# Patient Record
Sex: Male | Born: 1950 | Race: White | Hispanic: No | Smoking: Former smoker
Health system: Southern US, Community
[De-identification: ages and names within clinical notes are randomized; demographics above are authoritative.]

## PROBLEM LIST (undated history)

## (undated) DIAGNOSIS — I509 Heart failure, unspecified: Secondary | ICD-10-CM

## (undated) DIAGNOSIS — F09 Unspecified mental disorder due to known physiological condition: Secondary | ICD-10-CM

## (undated) DIAGNOSIS — R7303 Prediabetes: Secondary | ICD-10-CM

## (undated) DIAGNOSIS — R0902 Hypoxemia: Secondary | ICD-10-CM

## (undated) DIAGNOSIS — M1990 Unspecified osteoarthritis, unspecified site: Secondary | ICD-10-CM

## (undated) DIAGNOSIS — G4730 Sleep apnea, unspecified: Secondary | ICD-10-CM

## (undated) DIAGNOSIS — J449 Chronic obstructive pulmonary disease, unspecified: Secondary | ICD-10-CM

## (undated) DIAGNOSIS — R0600 Dyspnea, unspecified: Secondary | ICD-10-CM

## (undated) DIAGNOSIS — N19 Unspecified kidney failure: Secondary | ICD-10-CM

## (undated) DIAGNOSIS — Z5189 Encounter for other specified aftercare: Secondary | ICD-10-CM

## (undated) DIAGNOSIS — I219 Acute myocardial infarction, unspecified: Secondary | ICD-10-CM

## (undated) DIAGNOSIS — H269 Unspecified cataract: Secondary | ICD-10-CM

## (undated) DIAGNOSIS — E785 Hyperlipidemia, unspecified: Secondary | ICD-10-CM

## (undated) DIAGNOSIS — K219 Gastro-esophageal reflux disease without esophagitis: Secondary | ICD-10-CM

## (undated) DIAGNOSIS — I10 Essential (primary) hypertension: Secondary | ICD-10-CM

## (undated) HISTORY — DX: Acute myocardial infarction, unspecified: I21.9

## (undated) HISTORY — PX: OTHER SURGICAL HISTORY: SHX169

## (undated) HISTORY — DX: Gastro-esophageal reflux disease without esophagitis: K21.9

## (undated) HISTORY — DX: Encounter for other specified aftercare: Z51.89

## (undated) HISTORY — DX: Hypoxemia: R09.02

## (undated) SURGERY — Surgical Case
Anesthesia: *Unknown

---

## 2021-02-22 ENCOUNTER — Ambulatory Visit (INDEPENDENT_AMBULATORY_CARE_PROVIDER_SITE_OTHER): Payer: Medicare Other | Admitting: Orthopedic Surgery

## 2021-02-22 ENCOUNTER — Ambulatory Visit: Payer: Self-pay

## 2021-02-22 ENCOUNTER — Other Ambulatory Visit: Payer: Self-pay

## 2021-02-22 DIAGNOSIS — G8929 Other chronic pain: Secondary | ICD-10-CM | POA: Diagnosis not present

## 2021-02-22 DIAGNOSIS — M25561 Pain in right knee: Secondary | ICD-10-CM

## 2021-02-22 DIAGNOSIS — M549 Dorsalgia, unspecified: Secondary | ICD-10-CM | POA: Diagnosis not present

## 2021-02-22 DIAGNOSIS — M25562 Pain in left knee: Secondary | ICD-10-CM

## 2021-02-26 ENCOUNTER — Encounter: Payer: Self-pay | Admitting: Orthopedic Surgery

## 2021-02-26 NOTE — Progress Notes (Signed)
Office Visit Note   Patient: Randall Montoya           Date of Birth: 1950-06-29           MRN: [id number] Visit Date: 02/22/2021 Requested by: Mardi Mainland, [place],  [place] [address] PCP: Mardi Mainland, FNP  Subjective: Chief Complaint  Patient presents with   Left Knee - Pain   Right Knee - Pain    HPI: Shriyan is a 70 year old patient with bilateral knee pain and bilateral leg pain.  Reports weakness and giving way.  Also reports numbness and tingling in both feet left worse than right.  He ambulates with braces and a walker.  Does wake at night with cramps.  Reports only mild low back pain.  No prior knee or back surgery.  He has been in prison for 30 years.  States that he is "falling a lot".  Describes left foot numbness in the L4 distribution.              ROS: All systems reviewed are negative as they relate to the chief complaint within the history of present illness.  Patient denies  fevers or chills.   Assessment & Plan: Visit Diagnoses:  1. Chronic pain of both knees   2. Back pain, unspecified back location, unspecified back pain laterality, unspecified chronicity     Plan: Impression is back and leg pain with hip flexor weakness.  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  His lumbar spine radiographs do show some significant lower level facet arthritis.  Plan is with hip flexor weakness and falling that we get MRI of the lumbar spine to evaluate for possible stenosis.  Follow-up after that study.  Follow-Up Instructions: No follow-ups on file.   Orders:  Orders Placed This Encounter  Procedures   XR Knee 1-2 Views Right   XR Knee 1-2 Views Left   XR Lumbar Spine 2-3 Views   MR Lumbar Spine w/o contrast   No orders of the defined types were placed in this encounter.     Procedures: No procedures performed   Clinical Data: No additional findings.  Objective: Vital Signs: There were no vitals taken  for this visit.  Physical Exam:   Constitutional: Patient appears well-developed HEENT:  Head: Normocephalic Eyes:EOM are normal Neck: Normal range of motion Cardiovascular: Normal rate Pulmonary/chest: Effort normal Neurologic: Patient is alert Skin: Skin is warm Psychiatric: Patient has normal mood and affect   Ortho Exam: Ortho exam demonstrates hip flexor weakness 4 out of 5 bilaterally.  Ankle dorsiflexion plantarflexion abduction adduction leg extension strength 5 out of 5.  Does have paresthesias in the L4 distribution on the left.  Pedal pulses are palpable 0 to 1+ out of 4.  Both feet are perfused.  No nerve root tension signs.  No groin pain with internal or external rotation of the leg.  Both knees are examined.  Extensor mechanism is intact.  Patient has 0-1 25 of flexion both knees.  Collateral and cruciate ligaments are stable bilaterally.  No other masses lymphadenopathy or skin changes noted in that bilateral knee region.  Does have mild pain with forward flexion.  No muscle atrophy in either leg.  Specialty Comments:  No specialty comments available.  Imaging: No results found.   PMFS History: There are no problems to display for this patient.  No past medical history on file.  No family history on file.  Social History   Occupational History   Not on file  Tobacco Use   Smoking status: Not on file   Smokeless tobacco: Not on file  Substance and Sexual Activity   Alcohol use: Not on file   Drug use: Not on file   Sexual activity: Not on file

## 2021-03-02 ENCOUNTER — Other Ambulatory Visit: Payer: Self-pay | Admitting: Orthopedic Surgery

## 2021-03-02 DIAGNOSIS — M549 Dorsalgia, unspecified: Secondary | ICD-10-CM

## 2021-03-16 ENCOUNTER — Ambulatory Visit
Admission: RE | Admit: 2021-03-16 | Discharge: 2021-03-16 | Disposition: A | Discharge status: Home / Self Care | Payer: Medicare Other | Source: Ambulatory Visit | Attending: Orthopedic Surgery | Admitting: Orthopedic Surgery

## 2021-03-16 DIAGNOSIS — M549 Dorsalgia, unspecified: Secondary | ICD-10-CM

## 2021-03-16 IMAGING — MR MR LUMBAR SPINE W/O CM
4 of 5 series · 25 of 48 positions shown · non-contrast
Comparison: Radiographs [DATE].

CLINICAL DATA: Back pain, unspecified back pain location.

EXAM:
MRI LUMBAR SPINE WITHOUT CONTRAST
TECHNIQUE: Multiplanar, multisequence MR imaging of the lumbar spine was
performed. No intravenous contrast was administered.

[Series 3: T2 · sagittal · 4.0mm · 0.57mm/px · 6 of 15 slices shown (1 of 2)]
[im 1/15]
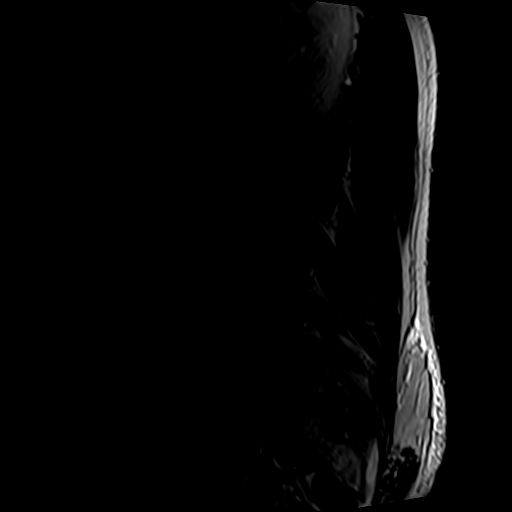
[im 3/15]
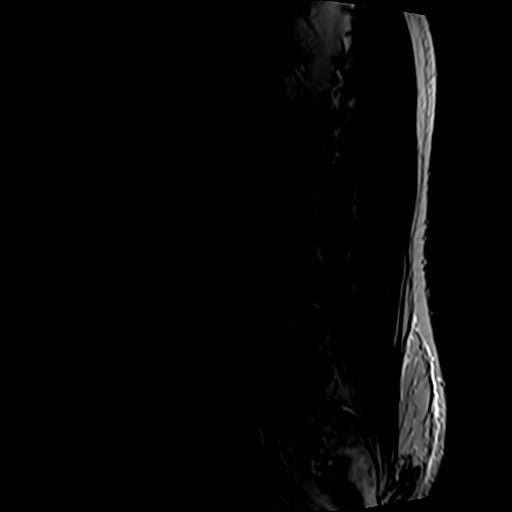
[im 6/15]
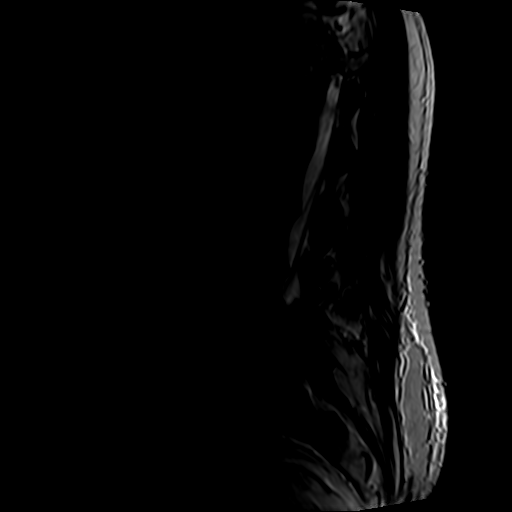
[im 9/15]
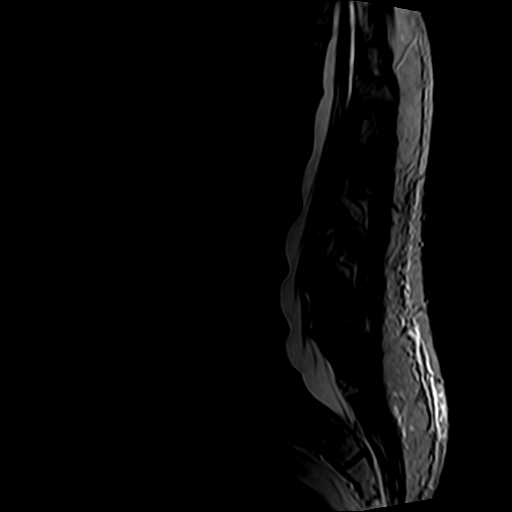
[im 12/15]
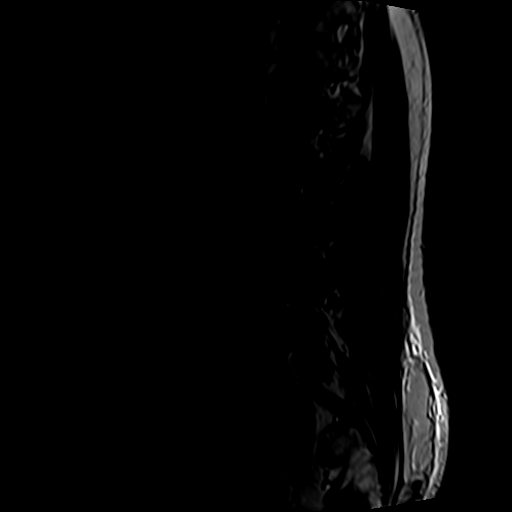
[im 15/15]
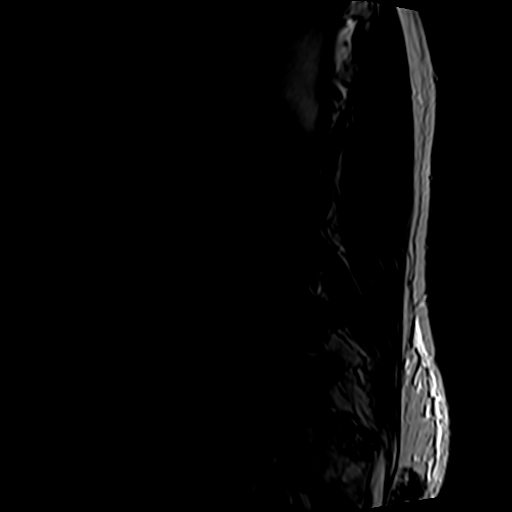

[Series 5: T1 · sagittal · 4.0mm · 0.57mm/px · 6 of 15 slices shown (1 of 2)]
[im 1/15]
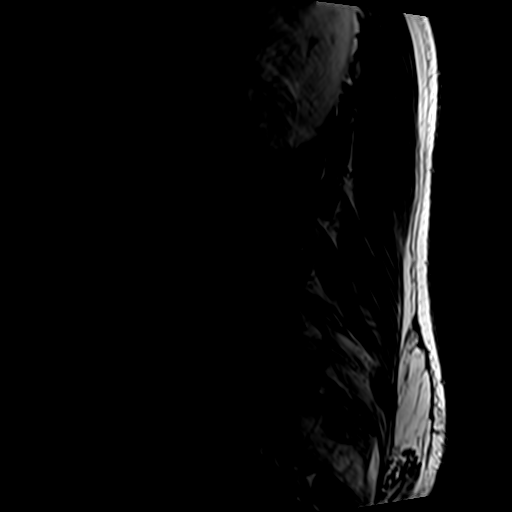
[im 3/15]
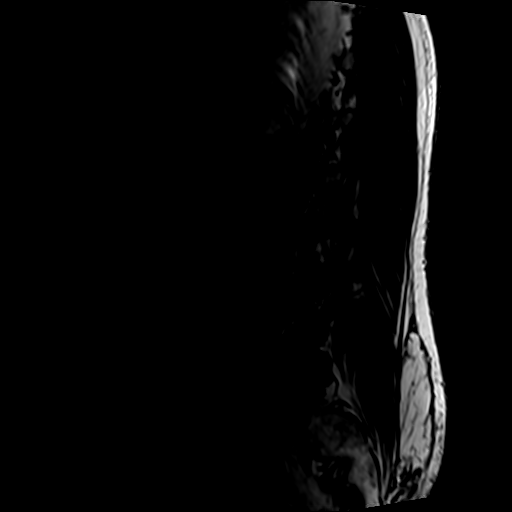
[im 6/15]
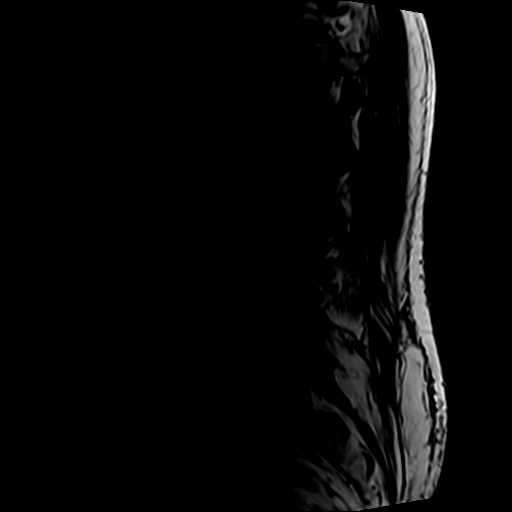
[im 9/15]
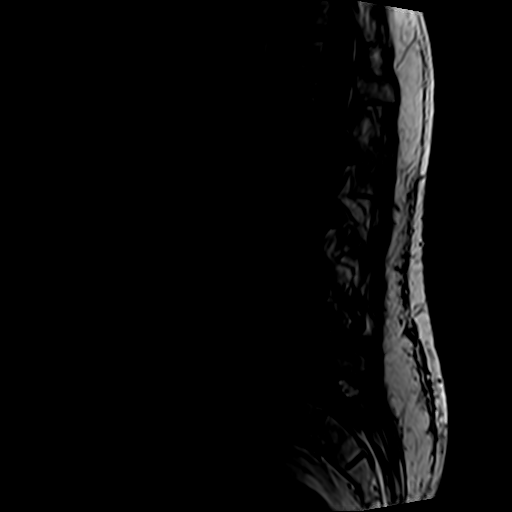
[im 12/15]
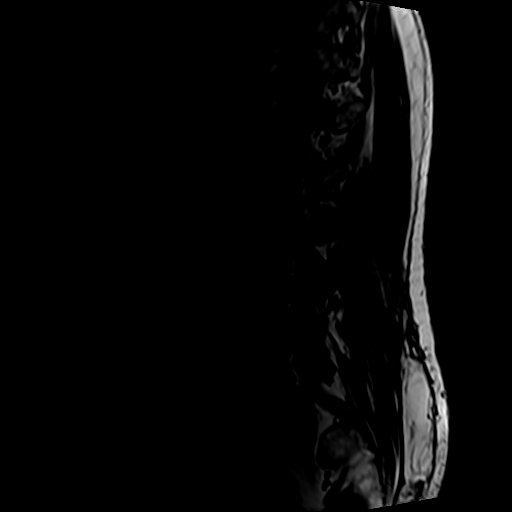
[im 15/15]
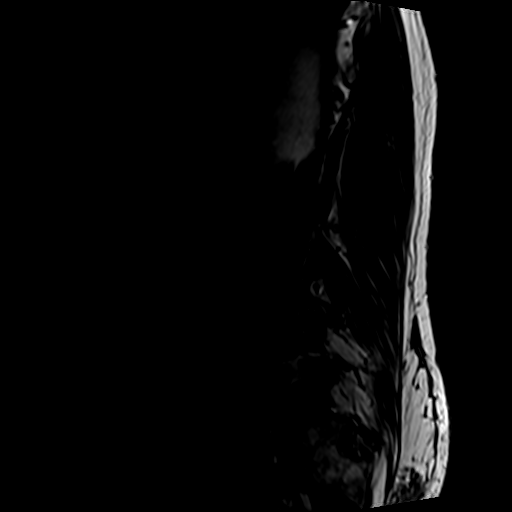

[Series 6: T2 · axial · 4.0mm · 0.70mm/px · z∈[-69,+143]mm · 9 of 38 slices shown (2 of 2)]
[im 1/38]
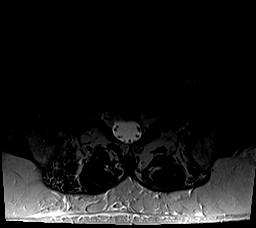
[im 6/38]
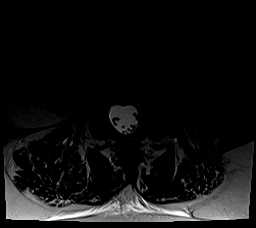
[im 11/38]
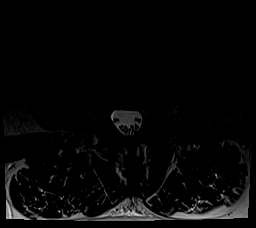
[im 16/38]
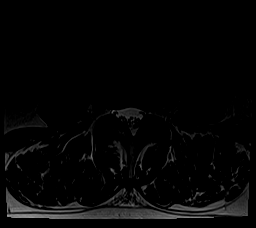
[im 19/38]
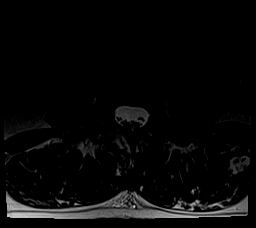
[im 22/38]
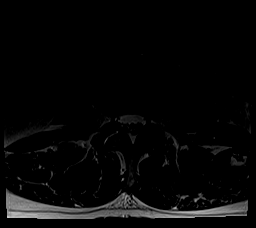
[im 27/38]
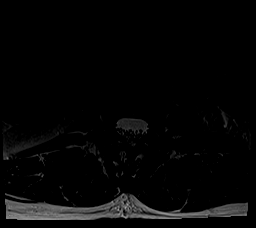
[im 32/38]
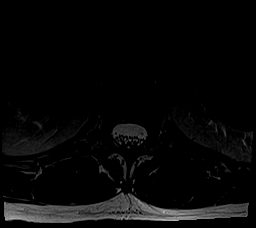
[im 38/38]
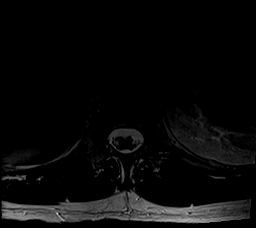

[Series 7: T1 · axial · 4.0mm · 0.35mm/px · z∈[-69,+112]mm · 4 of 38 slices shown (2 of 2)]
[im 1/38]
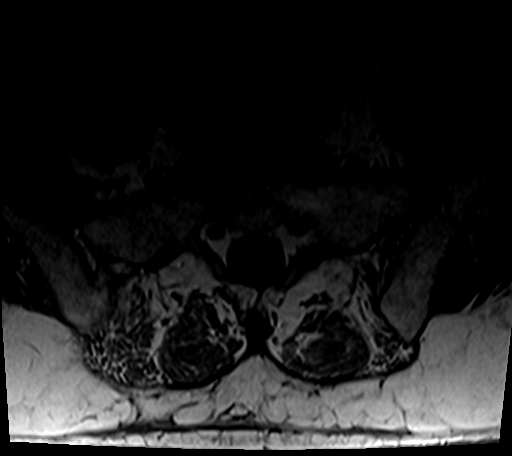
[im 6/38]
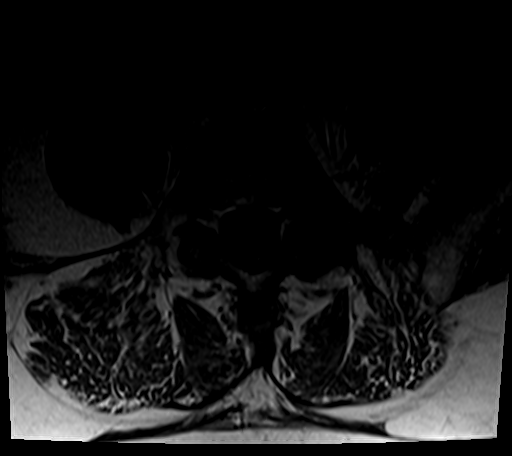
[im 19/38]
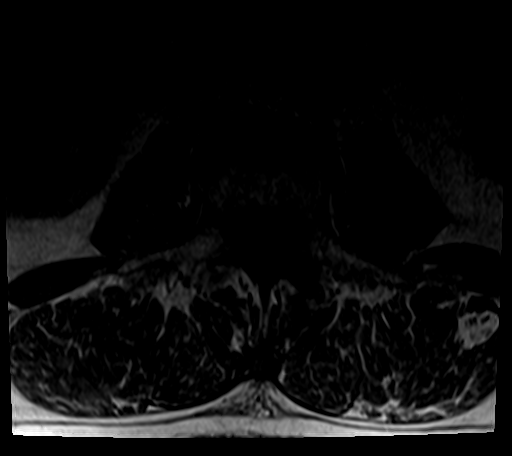
[im 32/38]
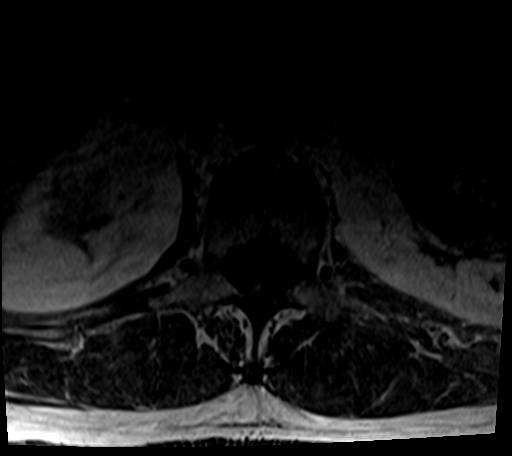

[25 of 48 positions shown; findings below may reference images not displayed]

FINDINGS: Segmentation:  Standard.

Alignment: Levoconvex scoliosis. Trace retrolisthesis of L2 over L3.

Vertebrae: No fracture, evidence of discitis, or bone lesion.
Schmorl node in the superior endplate of L3. Endplate degenerative
changes at L5-S1.

Conus medullaris and cauda equina: Conus extends to the L1 level.
Conus and cauda equina appear normal.

Paraspinal and other soft tissues: Negative.

Disc levels:

T12-L1: No spinal canal or neural foraminal stenosis.

L1-2: No spinal canal or neural foraminal stenosis.

L2-3: Shallow disc bulge and mild facet degenerative changes
resulting in mild bilateral neural foraminal narrowing. No spinal
canal stenosis.

L3-4: Shallow disc bulge, mild facet degenerative changes and
ligamentum flavum redundancy resulting in mild bilateral neural
foraminal narrowing. No significant spinal canal stenosis.

L4-5: Disc bulge, mild facet degenerative changes ligamentum flavum
redundancy resulting in mild bilateral neural foraminal narrowing.
No significant spinal stenosis.

L5-S1: Loss of disc height, disc bulge with superimposed left
subarticular/foraminal disc protrusion with associated osteophytic
component and mild-to-moderate facet degenerative changes with
ligamentum flavum redundancy resulting in narrowing of the left
subarticular zone and severe left neural foraminal narrowing.
IMPRESSION: 1. Degenerative changes at L5-S1 with narrowing of the left
subarticular zone and severe left neural foraminal narrowing, likely
impinging on the left L5 and S1 nerve roots.
2. Mild degenerative changes in the remainder of the lumbar spine
without high-grade spinal canal or neural foraminal stenosis.
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
## 2021-03-22 ENCOUNTER — Encounter: Payer: Self-pay | Admitting: Orthopedic Surgery

## 2021-03-22 ENCOUNTER — Other Ambulatory Visit: Payer: Self-pay

## 2021-03-22 ENCOUNTER — Ambulatory Visit (INDEPENDENT_AMBULATORY_CARE_PROVIDER_SITE_OTHER): Payer: Medicare Other | Admitting: Orthopedic Surgery

## 2021-03-22 DIAGNOSIS — M549 Dorsalgia, unspecified: Secondary | ICD-10-CM | POA: Diagnosis not present
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
## 2021-04-19 ENCOUNTER — Other Ambulatory Visit: Payer: Self-pay

## 2021-04-19 ENCOUNTER — Encounter: Payer: Self-pay | Admitting: Orthopaedic Surgery

## 2021-04-19 ENCOUNTER — Telehealth: Payer: Self-pay | Admitting: Physical Medicine and Rehabilitation

## 2021-04-19 ENCOUNTER — Ambulatory Visit (INDEPENDENT_AMBULATORY_CARE_PROVIDER_SITE_OTHER): Payer: Medicare Other | Admitting: Orthopaedic Surgery

## 2021-04-19 ENCOUNTER — Ambulatory Visit: Payer: Self-pay

## 2021-04-19 VITALS — BP 140/73 | HR 79 | Ht 68.0 in | Wt 245.4 lb

## 2021-04-19 DIAGNOSIS — M48061 Spinal stenosis, lumbar region without neurogenic claudication: Secondary | ICD-10-CM

## 2021-04-19 DIAGNOSIS — M542 Cervicalgia: Secondary | ICD-10-CM | POA: Diagnosis not present

## 2021-04-19 DIAGNOSIS — M549 Dorsalgia, unspecified: Secondary | ICD-10-CM | POA: Diagnosis not present

## 2021-04-19 DIAGNOSIS — M47812 Spondylosis without myelopathy or radiculopathy, cervical region: Secondary | ICD-10-CM | POA: Diagnosis not present

## 2021-04-19 NOTE — Telephone Encounter (Signed)
IC appt rescheduled.

## 2021-04-19 NOTE — Telephone Encounter (Signed)
Pt called stating hi has an appt on 12/21 already. He states he need to reschedule. Please call pt back at [phone].

## 2021-04-20 DIAGNOSIS — M48061 Spinal stenosis, lumbar region without neurogenic claudication: Secondary | ICD-10-CM | POA: Insufficient documentation

## 2021-04-20 DIAGNOSIS — M47812 Spondylosis without myelopathy or radiculopathy, cervical region: Secondary | ICD-10-CM | POA: Insufficient documentation
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
## 2021-05-04 ENCOUNTER — Observation Stay (HOSPITAL_BASED_OUTPATIENT_CLINIC_OR_DEPARTMENT_OTHER): Payer: Medicare Other

## 2021-05-04 ENCOUNTER — Inpatient Hospital Stay (HOSPITAL_COMMUNITY)
Admission: EM | Admit: 2021-05-04 | Discharge: 2021-05-06 | DRG: 291 | Disposition: A | Discharge status: Home / Self Care | Payer: Medicare Other | Attending: Internal Medicine | Admitting: Internal Medicine

## 2021-05-04 ENCOUNTER — Encounter (HOSPITAL_COMMUNITY): Payer: Self-pay

## 2021-05-04 ENCOUNTER — Other Ambulatory Visit: Payer: Self-pay

## 2021-05-04 ENCOUNTER — Emergency Department (HOSPITAL_COMMUNITY): Payer: Medicare Other

## 2021-05-04 DIAGNOSIS — G629 Polyneuropathy, unspecified: Secondary | ICD-10-CM | POA: Diagnosis present

## 2021-05-04 DIAGNOSIS — Z87891 Personal history of nicotine dependence: Secondary | ICD-10-CM

## 2021-05-04 DIAGNOSIS — R609 Edema, unspecified: Secondary | ICD-10-CM

## 2021-05-04 DIAGNOSIS — I10 Essential (primary) hypertension: Secondary | ICD-10-CM

## 2021-05-04 DIAGNOSIS — J441 Chronic obstructive pulmonary disease with (acute) exacerbation: Secondary | ICD-10-CM

## 2021-05-04 DIAGNOSIS — G4730 Sleep apnea, unspecified: Secondary | ICD-10-CM | POA: Diagnosis present

## 2021-05-04 DIAGNOSIS — I509 Heart failure, unspecified: Secondary | ICD-10-CM

## 2021-05-04 DIAGNOSIS — I5033 Acute on chronic diastolic (congestive) heart failure: Secondary | ICD-10-CM | POA: Diagnosis present

## 2021-05-04 DIAGNOSIS — I5032 Chronic diastolic (congestive) heart failure: Secondary | ICD-10-CM

## 2021-05-04 DIAGNOSIS — N1831 Chronic kidney disease, stage 3a: Secondary | ICD-10-CM | POA: Diagnosis present

## 2021-05-04 DIAGNOSIS — Z8249 Family history of ischemic heart disease and other diseases of the circulatory system: Secondary | ICD-10-CM

## 2021-05-04 DIAGNOSIS — Z955 Presence of coronary angioplasty implant and graft: Secondary | ICD-10-CM

## 2021-05-04 DIAGNOSIS — I252 Old myocardial infarction: Secondary | ICD-10-CM

## 2021-05-04 DIAGNOSIS — E785 Hyperlipidemia, unspecified: Secondary | ICD-10-CM

## 2021-05-04 DIAGNOSIS — I130 Hypertensive heart and chronic kidney disease with heart failure and stage 1 through stage 4 chronic kidney disease, or unspecified chronic kidney disease: Principal | ICD-10-CM | POA: Diagnosis present

## 2021-05-04 DIAGNOSIS — R7303 Prediabetes: Secondary | ICD-10-CM | POA: Diagnosis present

## 2021-05-04 DIAGNOSIS — Z8616 Personal history of COVID-19: Secondary | ICD-10-CM

## 2021-05-04 DIAGNOSIS — R0603 Acute respiratory distress: Secondary | ICD-10-CM | POA: Diagnosis present

## 2021-05-04 DIAGNOSIS — Z20822 Contact with and (suspected) exposure to covid-19: Secondary | ICD-10-CM | POA: Diagnosis present

## 2021-05-04 DIAGNOSIS — J449 Chronic obstructive pulmonary disease, unspecified: Secondary | ICD-10-CM | POA: Diagnosis present

## 2021-05-04 HISTORY — DX: Essential (primary) hypertension: I10

## 2021-05-04 HISTORY — DX: Unspecified mental disorder due to known physiological condition: F09

## 2021-05-04 HISTORY — DX: Heart failure, unspecified: I50.9

## 2021-05-04 HISTORY — DX: Sleep apnea, unspecified: G47.30

## 2021-05-04 HISTORY — DX: Hyperlipidemia, unspecified: E78.5

## 2021-05-04 HISTORY — DX: Unspecified osteoarthritis, unspecified site: M19.90

## 2021-05-04 HISTORY — DX: Unspecified cataract: H26.9

## 2021-05-04 HISTORY — DX: Unspecified kidney failure: N19

## 2021-05-04 HISTORY — DX: Chronic obstructive pulmonary disease, unspecified: J44.9

## 2021-05-04 HISTORY — DX: Prediabetes: R73.03

## 2021-05-04 LAB — COMPREHENSIVE METABOLIC PANEL
ALT: 34 U/L (ref 0–44)
AST: 22 U/L (ref 15–41)
Albumin: 4.4 g/dL (ref 3.5–5.0)
Alkaline Phosphatase: 87 U/L (ref 38–126)
Anion gap: 9 (ref 5–15)
BUN: 25 mg/dL — ABNORMAL HIGH (ref 8–23)
CO2: 27 mmol/L (ref 22–32)
Calcium: 9.8 mg/dL (ref 8.9–10.3)
Chloride: 101 mmol/L (ref 98–111)
Creatinine, Ser: 1.33 mg/dL — ABNORMAL HIGH (ref 0.61–1.24)
GFR, Estimated: 58 mL/min — ABNORMAL LOW (ref >60)
Glucose, Bld: 92 mg/dL (ref 70–99)
Potassium: 4.6 mmol/L (ref 3.5–5.1)
Sodium: 137 mmol/L (ref 135–145)
Total Bilirubin: 0.6 mg/dL (ref 0.3–1.2)
Total Protein: 7.2 g/dL (ref 6.5–8.1)

## 2021-05-04 LAB — CBC WITH DIFFERENTIAL/PLATELET
Abs Immature Granulocytes: 0.03 10*3/uL (ref 0.00–0.07)
Basophils Absolute: 0.1 10*3/uL (ref 0.0–0.1)
Basophils Relative: 1 %
Eosinophils Absolute: 0.2 10*3/uL (ref 0.0–0.5)
Eosinophils Relative: 2 %
HCT: 46.5 % (ref 39.0–52.0)
Hemoglobin: 15 g/dL (ref 13.0–17.0)
Immature Granulocytes: 0 %
Lymphocytes Relative: 27 %
Lymphs Abs: 2.1 10*3/uL (ref 0.7–4.0)
MCH: 28.8 pg (ref 26.0–34.0)
MCHC: 32.3 g/dL (ref 30.0–36.0)
MCV: 89.4 fL (ref 80.0–100.0)
Monocytes Absolute: 0.8 10*3/uL (ref 0.1–1.0)
Monocytes Relative: 10 %
Neutro Abs: 4.7 10*3/uL (ref 1.7–7.7)
Neutrophils Relative %: 60 %
Platelets: 205 10*3/uL (ref 150–400)
RBC: 5.2 MIL/uL (ref 4.22–5.81)
RDW: 14.4 % (ref 11.5–15.5)
WBC: 7.9 10*3/uL (ref 4.0–10.5)
nRBC: 0 % (ref 0.0–0.2)

## 2021-05-04 LAB — I-STAT CHEM 8, ED
BUN: 46 mg/dL — ABNORMAL HIGH (ref 8–23)
Calcium, Ion: 1.15 mmol/L (ref 1.15–1.40)
Chloride: 96 mmol/L — ABNORMAL LOW (ref 98–111)
Creatinine, Ser: 2.3 mg/dL — ABNORMAL HIGH (ref 0.61–1.24)
Glucose, Bld: 108 mg/dL — ABNORMAL HIGH (ref 70–99)
HCT: 41 % (ref 39.0–52.0)
Hemoglobin: 13.9 g/dL (ref 13.0–17.0)
Potassium: 3.9 mmol/L (ref 3.5–5.1)
Sodium: 132 mmol/L — ABNORMAL LOW (ref 135–145)
TCO2: 25 mmol/L (ref 22–32)

## 2021-05-04 LAB — RESP PANEL BY RT-PCR (FLU A&B, COVID) ARPGX2
Influenza A by PCR: NEGATIVE
Influenza B by PCR: NEGATIVE
SARS Coronavirus 2 by RT PCR: NEGATIVE

## 2021-05-04 LAB — I-STAT VENOUS BLOOD GAS, ED
Acid-Base Excess: 5 mmol/L — ABNORMAL HIGH (ref 0.0–2.0)
Bicarbonate: 30.4 mmol/L — ABNORMAL HIGH (ref 20.0–28.0)
Calcium, Ion: 1.18 mmol/L (ref 1.15–1.40)
HCT: 44 % (ref 39.0–52.0)
Hemoglobin: 15 g/dL (ref 13.0–17.0)
O2 Saturation: 100 %
Potassium: 4.5 mmol/L (ref 3.5–5.1)
Sodium: 138 mmol/L (ref 135–145)
TCO2: 32 mmol/L (ref 22–32)
pCO2, Ven: 44.7 mmHg (ref 44.0–60.0)
pH, Ven: 7.441 — ABNORMAL HIGH (ref 7.250–7.430)
pO2, Ven: 203 mmHg — ABNORMAL HIGH (ref 32.0–45.0)

## 2021-05-04 LAB — TROPONIN I (HIGH SENSITIVITY)
Troponin I (High Sensitivity): 6 ng/L (ref <18)
Troponin I (High Sensitivity): 6 ng/L (ref <18)

## 2021-05-04 LAB — BASIC METABOLIC PANEL
Anion gap: 10 (ref 5–15)
BUN: 26 mg/dL — ABNORMAL HIGH (ref 8–23)
CO2: 26 mmol/L (ref 22–32)
Calcium: 9.8 mg/dL (ref 8.9–10.3)
Chloride: 101 mmol/L (ref 98–111)
Creatinine, Ser: 1.34 mg/dL — ABNORMAL HIGH (ref 0.61–1.24)
GFR, Estimated: 57 mL/min — ABNORMAL LOW (ref >60)
Glucose, Bld: 97 mg/dL (ref 70–99)
Potassium: 4.2 mmol/L (ref 3.5–5.1)
Sodium: 137 mmol/L (ref 135–145)

## 2021-05-04 LAB — D-DIMER, QUANTITATIVE: D-Dimer, Quant: 0.43 ug/mL-FEU (ref 0.00–0.50)

## 2021-05-04 LAB — TSH: TSH: 2.542 u[IU]/mL (ref 0.350–4.500)

## 2021-05-04 LAB — VITAMIN B12: Vitamin B-12: 1104 pg/mL — ABNORMAL HIGH (ref 180–914)

## 2021-05-04 LAB — BRAIN NATRIURETIC PEPTIDE: B Natriuretic Peptide: 12.1 pg/mL (ref 0.0–100.0)

## 2021-05-04 IMAGING — CR DG CHEST 2V
2 series · 2 of 2 positions shown · non-contrast
Comparison: None.

CLINICAL DATA: Dyspnea.

EXAM:
CHEST - 2 VIEW

[chest ap]
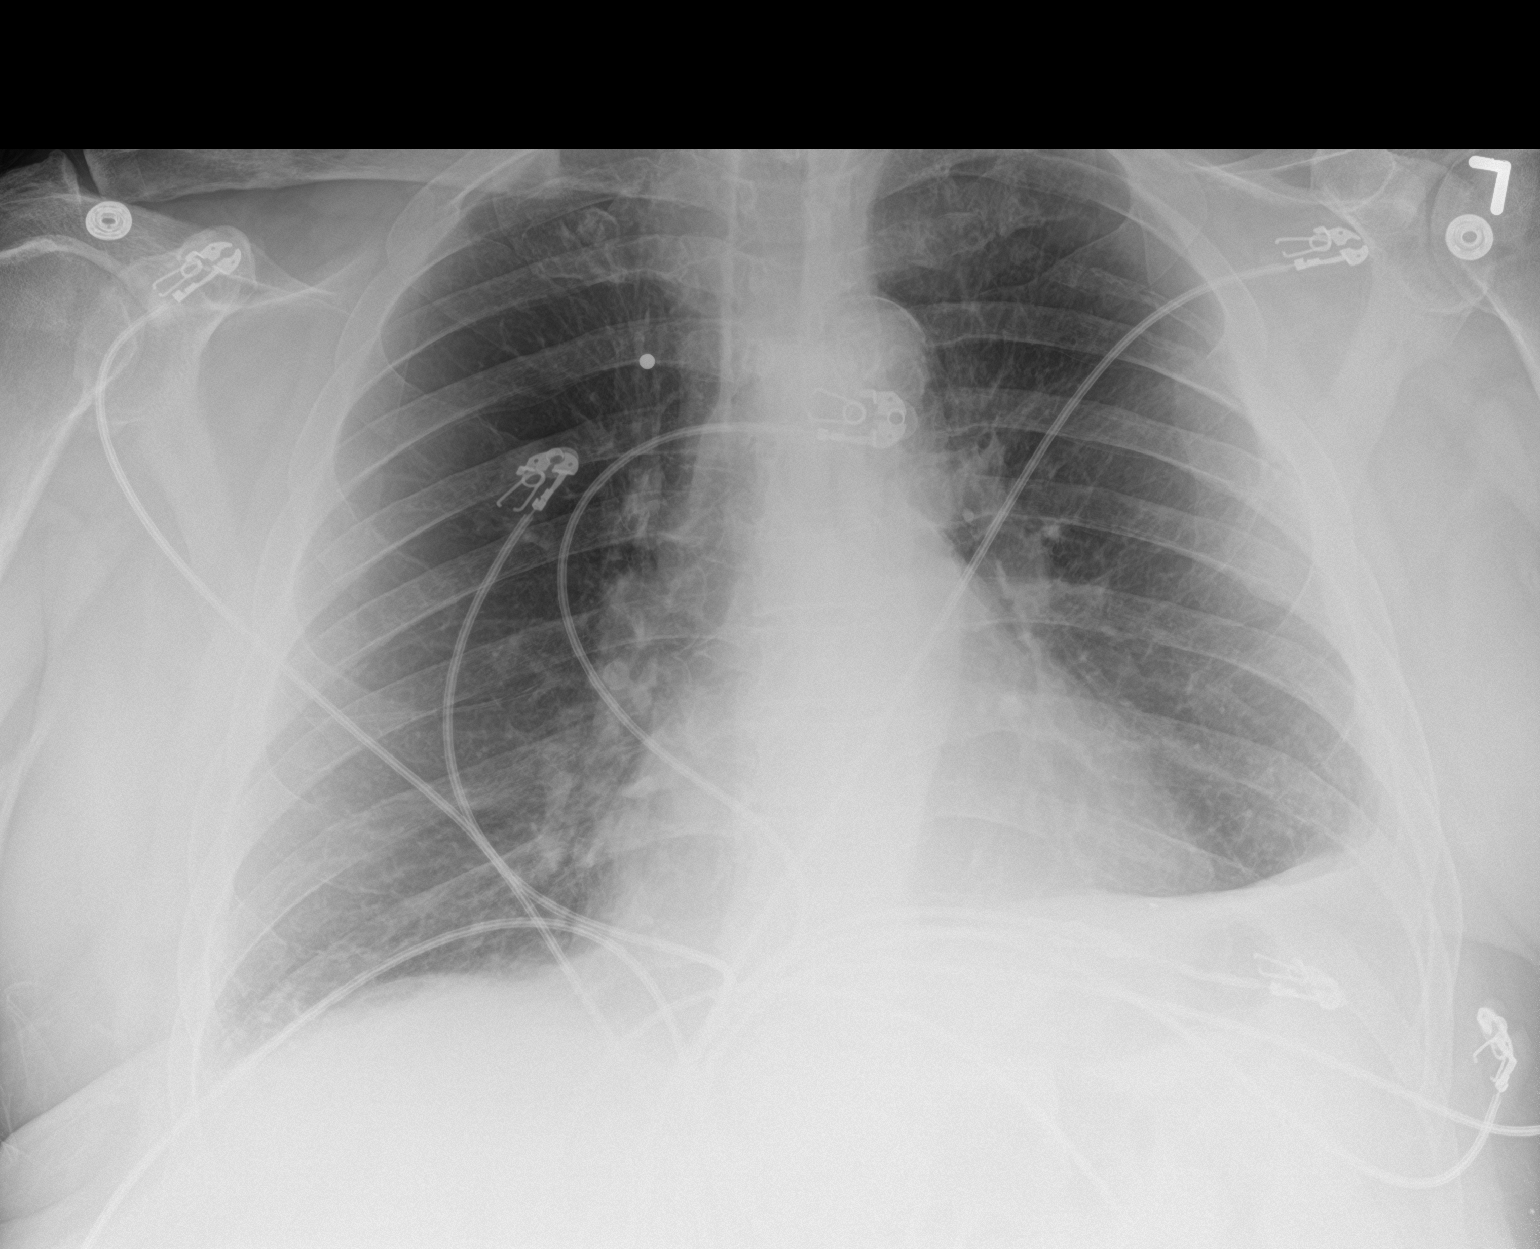

[chest lat]
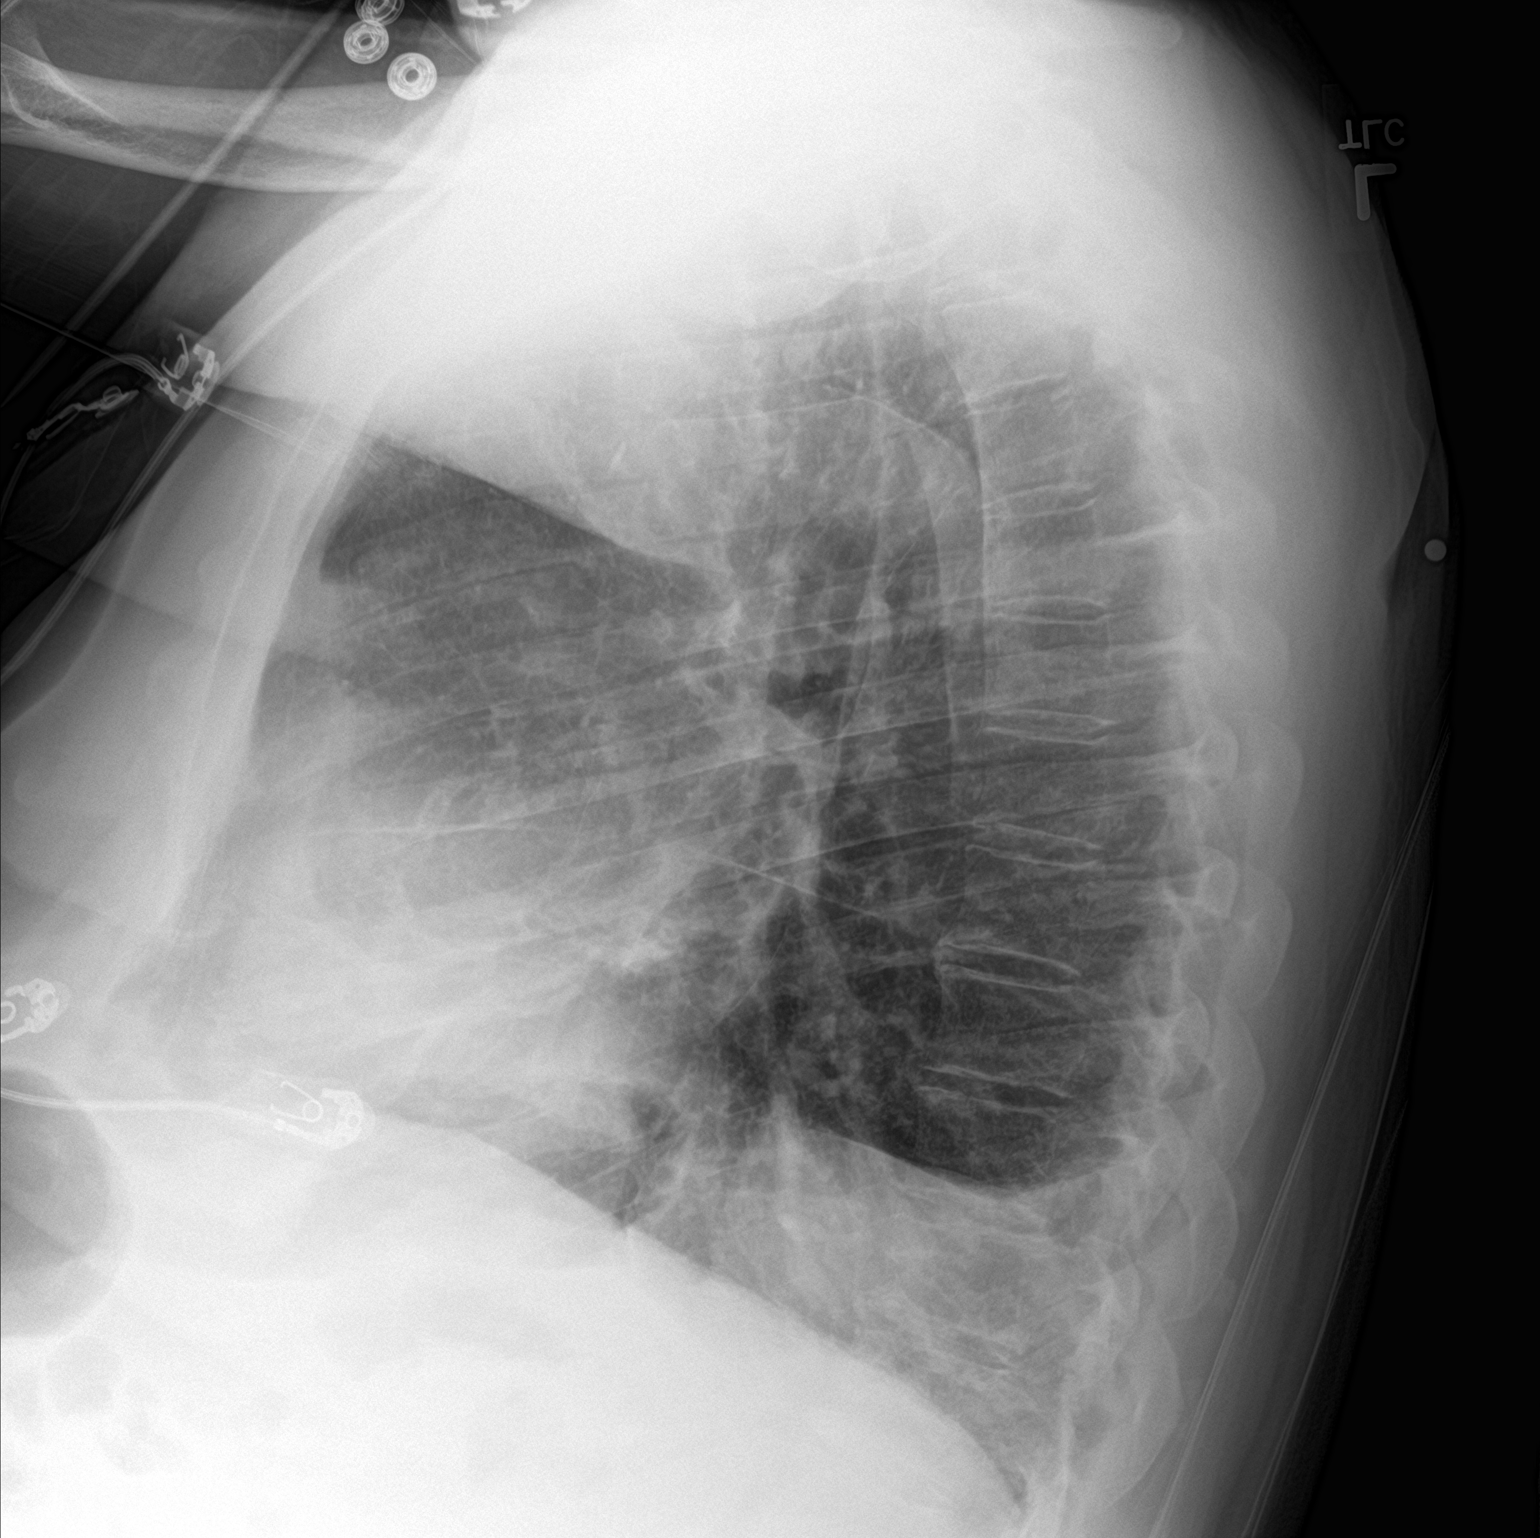

[2 of 2 positions shown; findings below may reference images not displayed]

FINDINGS: The heart size and mediastinal contours are within normal limits.
Right lung is clear. Minimal left basilar subsegmental atelectasis
is noted with small left pleural effusion. The visualized skeletal
structures are unremarkable.
IMPRESSION: Minimal left basilar subsegmental atelectasis is noted with small
left pleural effusion.

## 2021-05-04 MED ORDER — ALBUTEROL SULFATE (2.5 MG/3ML) 0.083% IN NEBU
2.5000 mg | INHALATION_SOLUTION | Freq: Four times a day (QID) | RESPIRATORY_TRACT | Status: DC
Start: 2021-05-04 — End: 2021-05-05
  Administered 2021-05-04 – 2021-05-05 (×3): 2.5 mg via RESPIRATORY_TRACT
  Filled 2021-05-04 (×3): qty 3

## 2021-05-04 MED ORDER — FUROSEMIDE 10 MG/ML IJ SOLN
40.0000 mg | Freq: Once | INTRAMUSCULAR | Status: AC
  Administered 2021-05-04: 40 mg via INTRAVENOUS
  Filled 2021-05-04: qty 4

## 2021-05-04 MED ORDER — ATORVASTATIN CALCIUM 40 MG PO TABS: 80.0000 mg | ORAL_TABLET | Freq: Every day | ORAL | Status: DC

## 2021-05-04 MED ORDER — CLOPIDOGREL BISULFATE 75 MG PO TABS
75.0000 mg | ORAL_TABLET | Freq: Every day | ORAL | Status: DC
  Administered 2021-05-05 – 2021-05-06 (×2): 75 mg via ORAL
  Filled 2021-05-04 (×2): qty 1

## 2021-05-04 MED ORDER — ENOXAPARIN SODIUM 30 MG/0.3ML IJ SOSY: 30.0000 mg | PREFILLED_SYRINGE | INTRAMUSCULAR | Status: DC

## 2021-05-04 MED ORDER — ATORVASTATIN CALCIUM 80 MG PO TABS
80.0000 mg | ORAL_TABLET | Freq: Every day | ORAL | Status: DC
  Administered 2021-05-05 – 2021-05-06 (×2): 80 mg via ORAL
  Filled 2021-05-04: qty 2
  Filled 2021-05-04: qty 1

## 2021-05-04 MED ORDER — SODIUM CHLORIDE 0.9% FLUSH: 3.0000 mL | INTRAVENOUS | Status: DC | PRN

## 2021-05-04 MED ORDER — OXYBUTYNIN CHLORIDE ER 5 MG PO TB24: 5.0000 mg | ORAL_TABLET | Freq: Every day | ORAL | Status: DC

## 2021-05-04 MED ORDER — UMECLIDINIUM BROMIDE 62.5 MCG/ACT IN AEPB
1.0000 | INHALATION_SPRAY | Freq: Every day | RESPIRATORY_TRACT | Status: DC
  Administered 2021-05-05: 1 via RESPIRATORY_TRACT
  Filled 2021-05-04: qty 7

## 2021-05-04 MED ORDER — TAMSULOSIN HCL 0.4 MG PO CAPS: 0.4000 mg | ORAL_CAPSULE | Freq: Every day | ORAL | Status: DC

## 2021-05-04 MED ORDER — ACETAMINOPHEN 325 MG PO TABS: 650.0000 mg | ORAL_TABLET | ORAL | Status: DC | PRN

## 2021-05-04 MED ORDER — ONDANSETRON HCL 4 MG/2ML IJ SOLN: 4.0000 mg | Freq: Four times a day (QID) | INTRAMUSCULAR | Status: DC | PRN

## 2021-05-04 MED ORDER — POTASSIUM CHLORIDE CRYS ER 20 MEQ PO TBCR
20.0000 meq | EXTENDED_RELEASE_TABLET | Freq: Once | ORAL | Status: AC
  Administered 2021-05-04: 20 meq via ORAL
  Filled 2021-05-04: qty 1

## 2021-05-04 MED ORDER — CLOPIDOGREL BISULFATE 75 MG PO TABS: 75.0000 mg | ORAL_TABLET | Freq: Every day | ORAL | Status: DC

## 2021-05-04 MED ORDER — AMLODIPINE BESYLATE 10 MG PO TABS
10.0000 mg | ORAL_TABLET | Freq: Every day | ORAL | Status: DC
  Administered 2021-05-05 – 2021-05-06 (×2): 10 mg via ORAL
  Filled 2021-05-04: qty 2
  Filled 2021-05-04: qty 1

## 2021-05-04 MED ORDER — FUROSEMIDE 10 MG/ML IJ SOLN: 60.0000 mg | Freq: Two times a day (BID) | INTRAMUSCULAR | Status: DC

## 2021-05-04 MED ORDER — ALBUTEROL SULFATE HFA 108 (90 BASE) MCG/ACT IN AERS: 1.0000 | INHALATION_SPRAY | Freq: Four times a day (QID) | RESPIRATORY_TRACT | Status: DC

## 2021-05-04 MED ORDER — SODIUM CHLORIDE 0.9 % IV SOLN: 250.0000 mL | INTRAVENOUS | Status: DC | PRN

## 2021-05-04 MED ORDER — OXYBUTYNIN CHLORIDE ER 5 MG PO TB24
5.0000 mg | ORAL_TABLET | Freq: Every day | ORAL | Status: DC
  Administered 2021-05-05 – 2021-05-06 (×2): 5 mg via ORAL
  Filled 2021-05-04 (×2): qty 1

## 2021-05-04 MED ORDER — ENOXAPARIN SODIUM 60 MG/0.6ML IJ SOSY
0.5000 mg/kg | PREFILLED_SYRINGE | INTRAMUSCULAR | Status: DC
  Administered 2021-05-04 – 2021-05-05 (×2): 55 mg via SUBCUTANEOUS
  Filled 2021-05-04: qty 0.6
  Filled 2021-05-04: qty 0.55

## 2021-05-04 MED ORDER — NITROGLYCERIN 0.4 MG SL SUBL: 0.4000 mg | SUBLINGUAL_TABLET | SUBLINGUAL | Status: DC | PRN

## 2021-05-04 MED ORDER — FLUTICASONE-UMECLIDIN-VILANT 100-62.5-25 MCG/ACT IN AEPB: 1.0000 | INHALATION_SPRAY | Freq: Every day | RESPIRATORY_TRACT | Status: DC

## 2021-05-04 MED ORDER — FUROSEMIDE 10 MG/ML IJ SOLN
60.0000 mg | Freq: Two times a day (BID) | INTRAMUSCULAR | Status: DC
  Administered 2021-05-04: 60 mg via INTRAVENOUS
  Filled 2021-05-04: qty 6

## 2021-05-04 MED ORDER — SODIUM CHLORIDE 0.9% FLUSH
3.0000 mL | Freq: Two times a day (BID) | INTRAVENOUS | Status: DC
  Administered 2021-05-04 – 2021-05-06 (×4): 3 mL via INTRAVENOUS

## 2021-05-04 MED ORDER — FLUTICASONE FUROATE-VILANTEROL 100-25 MCG/ACT IN AEPB
1.0000 | INHALATION_SPRAY | Freq: Every day | RESPIRATORY_TRACT | Status: DC
  Administered 2021-05-05 – 2021-05-06 (×2): 1 via RESPIRATORY_TRACT
  Filled 2021-05-04: qty 28

## 2021-05-04 MED ORDER — TAMSULOSIN HCL 0.4 MG PO CAPS
0.4000 mg | ORAL_CAPSULE | Freq: Every day | ORAL | Status: DC
  Administered 2021-05-05 – 2021-05-06 (×2): 0.4 mg via ORAL
  Filled 2021-05-04 (×2): qty 1

## 2021-05-04 MED ORDER — METOPROLOL TARTRATE 50 MG PO TABS
50.0000 mg | ORAL_TABLET | Freq: Two times a day (BID) | ORAL | Status: DC
  Administered 2021-05-04 – 2021-05-06 (×4): 50 mg via ORAL
  Filled 2021-05-04: qty 2
  Filled 2021-05-04 (×2): qty 1
  Filled 2021-05-04: qty 2

## 2021-05-04 NOTE — ED Notes (Signed)
Patient transported to vascular. 

## 2021-05-04 NOTE — ED Provider Notes (Signed)
[facility] Provider Note   CSN: [id number] Arrival date & time: 05/04/21  1325     History Chief Complaint  Patient presents with   Shortness of Breath    [facility] is a 70 y.o. male with patient reported history of CHF on Lasix who presents emergency department for evaluation of dyspnea on exertion and extremity swelling.  Patient states that over the last 2 weeks he has had progressively worsening shortness of breath on exertion and that he has gained 10 pounds despite eating less than normal.  He states that he saw a primary doctor today who sent him to the emergency department due to a the patient's rapid breathing and fluid overload.  He denies fever, chest pain, abdominal pain, nausea, vomiting, diarrhea or other systemic symptoms.  HPI     Past Medical History:  Diagnosis Date   Arthritis    Brain syndrome    Cataract    Congestive heart failure (CHF) (HCC)    COPD (chronic obstructive pulmonary disease) ([place])    Hyperlipidemia    Hypertension    Kidney failure    Prediabetes    Sleep apnea     Patient Active Problem List   Diagnosis Date Noted   Respiratory distress 05/04/2021   Lumbar foraminal stenosis 04/20/2021   Spondylosis without myelopathy or radiculopathy, cervical region 04/20/2021    Past Surgical History:  Procedure Laterality Date   heart stent     myocardial infarction         No family history on file.  Social History   Tobacco Use   Smoking status: Former    Years: 50.00    Types: Cigarettes  Substance Use Topics   Alcohol use: Not Currently   Drug use: Never    Home Medications Prior to Admission medications   Not on File    Allergies    Patient has no known allergies.  Review of Systems   Review of Systems  Constitutional:  Negative for chills and fever.  HENT:  Negative for ear pain and sore throat.   Eyes:  Negative for pain and visual disturbance.  Respiratory:  Positive  for shortness of breath. Negative for cough.   Cardiovascular:  Positive for leg swelling. Negative for chest pain and palpitations.  Gastrointestinal:  Negative for abdominal pain and vomiting.  Genitourinary:  Negative for dysuria and hematuria.  Musculoskeletal:  Negative for arthralgias and back pain.  Skin:  Negative for color change and rash.  Neurological:  Negative for seizures and syncope.  All other systems reviewed and are negative.  Physical Exam Updated Vital Signs BP (!) 135/58    Pulse 67    Temp 98.1 F (36.7 C) (Oral)    Resp (!) 27    Ht 5\' 8"  (1.727 m)    Wt 111.1 kg    SpO2 97%    BMI 37.25 kg/m   Physical Exam Vitals and nursing note reviewed.  Constitutional:      General: He is not in acute distress.    Appearance: He is well-developed.  HENT:     Head: Normocephalic and atraumatic.  Eyes:     Conjunctiva/sclera: Conjunctivae normal.  Cardiovascular:     Rate and Rhythm: Normal rate and regular rhythm.     Heart sounds: No murmur heard. Pulmonary:     Effort: Pulmonary effort is normal. Tachypnea present. No respiratory distress.     Breath sounds: Normal breath sounds.  Abdominal:  Palpations: Abdomen is soft.     Tenderness: There is no abdominal tenderness.  Musculoskeletal:        General: No swelling.     Cervical back: Neck supple.     Right lower leg: Edema present.     Left lower leg: Edema present.  Skin:    General: Skin is warm and dry.     Capillary Refill: Capillary refill takes less than 2 seconds.  Neurological:     Mental Status: He is alert.  Psychiatric:        Mood and Affect: Mood normal.    ED Results / Procedures / Treatments   Labs (all labs ordered are listed, but only abnormal results are displayed) Labs Reviewed  COMPREHENSIVE METABOLIC PANEL - Abnormal; Notable for the following components:      Result Value   BUN 25 (*)    Creatinine, Ser 1.33 (*)    GFR, Estimated 58 (*)    All other components within  normal limits  I-STAT VENOUS BLOOD GAS, ED - Abnormal; Notable for the following components:   pH, Ven 7.441 (*)    pO2, Ven 203.0 (*)    Bicarbonate 30.4 (*)    Acid-Base Excess 5.0 (*)    All other components within normal limits  I-STAT CHEM 8, ED - Abnormal; Notable for the following components:   Sodium 132 (*)    Chloride 96 (*)    BUN 46 (*)    Creatinine, Ser 2.30 (*)    Glucose, Bld 108 (*)    All other components within normal limits  RESP PANEL BY RT-PCR (FLU A&B, COVID) ARPGX2  CBC WITH DIFFERENTIAL/PLATELET  BRAIN NATRIURETIC PEPTIDE  BLOOD GAS, VENOUS  BASIC METABOLIC PANEL  TROPONIN I (HIGH SENSITIVITY)  TROPONIN I (HIGH SENSITIVITY)    EKG EKG Interpretation  Date/Time:  Thursday May 04 2021 13:38:26 EST Ventricular Rate:  71 PR Interval:  174 QRS Duration: 98 QT Interval:  377 QTC Calculation: 410 R Axis:   12 Text Interpretation: Sinus rhythm Confirmed by [place] ([id number]) on 05/04/2021 3:34:50 PM  Radiology DG Chest 2 View  Result Date: 05/04/2021 CLINICAL DATA:  Dyspnea. EXAM: CHEST - 2 VIEW COMPARISON:  None. FINDINGS: The heart size and mediastinal contours are within normal limits. Right lung is clear. Minimal left basilar subsegmental atelectasis is noted with small left pleural effusion. The visualized skeletal structures are unremarkable. IMPRESSION: Minimal left basilar subsegmental atelectasis is noted with small left pleural effusion. Electronically Signed   By: Marijo Conception M.D.   On: 05/04/2021 14:25    Procedures Procedures   Medications Ordered in ED Medications  furosemide (LASIX) injection 40 mg (40 mg Intravenous Given 05/04/21 1439)    ED Course  I have reviewed the triage vital signs and the nursing notes.  Pertinent labs & imaging results that were available during my care of the patient were reviewed by me and considered in my medical decision making (see chart for details).    MDM Rules/Calculators/A&P                            The patient seen the emergency department for evaluation of shortness of breath and lower extremity swelling.  Physical exam reveals bilateral lower extremity edema and a patient with tachypnea in the 30s.  Laboratory evaluation with a creatinine elevation to 1.33, CBC unremarkable, troponin unremarkable, BNP 12.1, VBG unremarkable with elevated bicarb to 30.4 likely  elevated in the setting of the patient's tachypnea.  Chest x-ray with a small left-sided pleural effusion.  In the setting of the patient's weight gain and lower extremity swelling, concern for CHF exacerbation.  Patient started on 40 mg IV Lasix and will require admission for diuresis and persistent tachypnea. Final Clinical Impression(s) / ED Diagnoses Final diagnoses:  Acute on chronic congestive heart failure, unspecified heart failure type [facility])    Rx / [place] Orders ED Discharge Orders     None        Darsh Vandevoort, Debe Coder, MD 05/04/21 1538

## 2021-05-04 NOTE — ED Triage Notes (Signed)
Pt arrived via [facility] from PCP office for SOB, fluid retention and 10 lb wt gain over a 2 wk period. Pt is A&Ox4. Pt is tachypneic w/exertion.

## 2021-05-04 NOTE — ED Notes (Signed)
Pt eating dinner

## 2021-05-04 NOTE — H&P (Signed)
History and Physical  [facility] PXT:[id number] DOB: 06/27/1950 DOA: 05/04/2021  PCP: Mardi Mainland, FNP Patient coming from: Home   I have personally briefly reviewed patient's old medical records in [place]   Chief Complaint: SOB, Fluid retention.   HPI: [facility] is a 70 y.o. male past medical history significant for congestive heart failure, COPD, hypertension, hyperlipidemia, per patient history of bypass in 5 cardiac  stent, history of COVID last year, CKD, had worsening renal function when he was hospitalized with COVID last year.  He presents complaining of worsening shortness of breath at rest and on exertion.  He report more than 10 pounds weight gain over the last month.  He report bilateral lower extremity edema, worse on the left leg.  He report productive cough, denies fever.  He quit smoking 2003.  Report right side chest pain, on and off.  He presented to see his PCP today and was referred for for further evaluation to the ED.  Of Note, He uses a walker for ambulation.  He has been having some back pain and he is following with orthopedic for these. He supposed to get a steroid injection.  He also complaining of bilateral feet and hands and burning pain.   He was released from prison  December 3rd/2022.  Evaluation in the ED: He was noted to be in respiratory distress, tachypneic, oxygen saturation 97 on room air.  Sodium 137, creatinine 1.3, normal liver function test, pH 7.4 PCO2 44 PO2 200, troponin 6, BNP 12.   Chest x-ray: Minimal left basilar subsegmental atelectasis is noted with small left pleural effusion. He received IV Lasix in the ED.  Review of Systems: All systems reviewed and apart from history of presenting illness, are negative.  Past Medical History:  Diagnosis Date   Arthritis    Brain syndrome    Cataract    Congestive heart failure (CHF) (HCC)    COPD (chronic obstructive pulmonary disease) (HCC)    Hyperlipidemia     Hypertension    Kidney failure    Prediabetes    Sleep apnea    Past Surgical History:  Procedure Laterality Date   heart stent     myocardial infarction     Social History:  reports that he has quit smoking. His smoking use included cigarettes. He does not have any smokeless tobacco history on file. He reports that he does not currently use alcohol. He reports that he does not use drugs.   No Known Allergies  Family History:  Father died from lung cancer 44 year old.  Mother died of a massive MI at age of 95.    Prior to Admission medications   Not on File   Physical Exam: Vitals:   05/04/21 1329 05/04/21 1332 05/04/21 1338 05/04/21 1441  BP:   (!) 158/72 (!) 135/58  Pulse:   71 67  Resp:   17 (!) 27  Temp:   98.1 F (36.7 C)   TempSrc:   Oral   SpO2: 95%  98% 97%  Weight:  111.1 kg    Height:  5\' 8"  (1.727 m)      General exam: Moderately built and nourished patient, lying comfortably supine on the gurney in no obvious  distress. Head, eyes and ENT: Nontraumatic and normocephalic. Pupils equally reacting to light and accommodation. Oral mucosa moist. Neck: Supple. No JVD, carotid bruit or thyromegaly. Lymphatics: No lymphadenopathy. Respiratory system: Mild tachypnea, No wheezing, BL crackles.  Cardiovascular system: S1  and S2 heard, RRR. No   murmurs, gallops, clicks or pedal edema. Gastrointestinal system: Abdomen is nondistended, soft and nontender. Normal bowel sounds heard. No organomegaly or masses appreciated. Central nervous system: Alert and oriented. No focal neurological deficits. Extremities: Symmetric 5 x 5 power. Peripheral pulses symmetrically felt.  Skin: No rashes or acute findings. Musculoskeletal system: Negative exam. Psychiatry: Pleasant and cooperative.   Labs on Admission:  Basic Metabolic Panel: Recent Labs  Lab 05/04/21 1355 05/04/21 1511 05/04/21 1512  NA 137 132* 138  K 4.6 3.9 4.5  CL 101 96*  --   CO2 27  --   --   GLUCOSE  92 108*  --   BUN 25* 46*  --   CREATININE 1.33* 2.30*  --   CALCIUM 9.8  --   --    Liver Function Tests: Recent Labs  Lab 05/04/21 1355  AST 22  ALT 34  ALKPHOS 87  BILITOT 0.6  PROT 7.2  ALBUMIN 4.4   No results for input(s): LIPASE, AMYLASE in the last 168 hours. No results for input(s): AMMONIA in the last 168 hours. CBC: Recent Labs  Lab 05/04/21 1355 05/04/21 1511 05/04/21 1512  WBC 7.9  --   --   NEUTROABS 4.7  --   --   HGB 15.0 13.9 15.0  HCT 46.5 41.0 44.0  MCV 89.4  --   --   PLT 205  --   --    Cardiac Enzymes: No results for input(s): CKTOTAL, CKMB, CKMBINDEX, TROPONINI in the last 168 hours.  BNP (last 3 results) No results for input(s): PROBNP in the last 8760 hours. CBG: No results for input(s): GLUCAP in the last 168 hours.  Radiological Exams on Admission: DG Chest 2 View  Result Date: 05/04/2021 CLINICAL DATA:  Dyspnea. EXAM: CHEST - 2 VIEW COMPARISON:  None. FINDINGS: The heart size and mediastinal contours are within normal limits. Right lung is clear. Minimal left basilar subsegmental atelectasis is noted with small left pleural effusion. The visualized skeletal structures are unremarkable. IMPRESSION: Minimal left basilar subsegmental atelectasis is noted with small left pleural effusion. Electronically Signed   By: Marijo Conception M.D.   On: 05/04/2021 14:25    EKG: Independently reviewed. Sinus Rhythm.   Assessment/Plan Principal Problem:   Respiratory distress Active Problems:   Congestive heart failure (CHF) (HCC)   COPD (chronic obstructive pulmonary disease) (HCC)   Hypertension   Hyperlipidemia   1-Respiratory distress, shortness of breath: Presented with shortness of breath, tachypnea, weight gain.  Chest x-ray left-sided pleural effusion and atelectasis.  Although BNP is normal concern for heart failure exacerbation.  Clinically he looks volume overload. -Plan to get 2D echo -Due to report of chest pain we will proceed with  D-dimer.  If elevated may need VQ scan versus CT depending on kidney function -No wheezing on lung exams, less likely COPD exacerbation.  Will resume his inhalers -BiPAP as needed for increased work of breathing.   2-Acute on chronic congestive heart failure, no prior echo. Unclear type HF -Continue with IV Lasix, 60 twice daily -Daily weight, strict I's and O's -2D echo order  3-CKD stage IIIa. Presented with a creatinine of 1.3.  Unclear prior baseline. He had a creatinine of 2.3 from i-STAT likely not accurate. Hold ACE for now.  Function remained stable will be able to resume ACE   4-Hypertension: Continue with Norvasc, metoprolol Resume lisinopril tomorrow if renal function remains stable  5-Feet and hands tingling: Likely  related to neuropathy. Check TSH and B12.  6-COPD: No evidence of acute flare. Continue with albuterol and Trelegy  7-Chest pain: Troponins x2 negative EKG without ST elevation Check D-dimer.  As needed nitroglycerin  Screening for COVID pending  DVT Prophylaxis: Lovenox Code Status: Full Code Family Communication: Care Discussed with patient  Disposition Plan: Admit under observation for further evaluation of respiratory distress and shortness of breath.  Time spent: 75 minutes  Elmarie Shiley MD [facility]   05/04/2021, 4:26 PM

## 2021-05-04 NOTE — Progress Notes (Signed)
Lower extremity venous bilateral study completed.  Preliminary results relayed to [facility], MD via secure chat.  See CV Proc for preliminary results report.   Darlin Coco, RDMS, RVT

## 2021-05-05 ENCOUNTER — Observation Stay (HOSPITAL_COMMUNITY): Payer: Medicare Other

## 2021-05-05 DIAGNOSIS — I5033 Acute on chronic diastolic (congestive) heart failure: Secondary | ICD-10-CM | POA: Diagnosis present

## 2021-05-05 DIAGNOSIS — R0609 Other forms of dyspnea: Secondary | ICD-10-CM | POA: Diagnosis not present

## 2021-05-05 DIAGNOSIS — N1831 Chronic kidney disease, stage 3a: Secondary | ICD-10-CM | POA: Diagnosis present

## 2021-05-05 DIAGNOSIS — R0603 Acute respiratory distress: Secondary | ICD-10-CM | POA: Diagnosis present

## 2021-05-05 DIAGNOSIS — I252 Old myocardial infarction: Secondary | ICD-10-CM | POA: Diagnosis not present

## 2021-05-05 DIAGNOSIS — Z8249 Family history of ischemic heart disease and other diseases of the circulatory system: Secondary | ICD-10-CM | POA: Diagnosis not present

## 2021-05-05 DIAGNOSIS — J449 Chronic obstructive pulmonary disease, unspecified: Secondary | ICD-10-CM | POA: Diagnosis present

## 2021-05-05 DIAGNOSIS — I130 Hypertensive heart and chronic kidney disease with heart failure and stage 1 through stage 4 chronic kidney disease, or unspecified chronic kidney disease: Secondary | ICD-10-CM | POA: Diagnosis present

## 2021-05-05 DIAGNOSIS — Z955 Presence of coronary angioplasty implant and graft: Secondary | ICD-10-CM | POA: Diagnosis not present

## 2021-05-05 DIAGNOSIS — I509 Heart failure, unspecified: Secondary | ICD-10-CM

## 2021-05-05 DIAGNOSIS — Z8616 Personal history of COVID-19: Secondary | ICD-10-CM | POA: Diagnosis not present

## 2021-05-05 DIAGNOSIS — R7303 Prediabetes: Secondary | ICD-10-CM | POA: Diagnosis present

## 2021-05-05 DIAGNOSIS — E785 Hyperlipidemia, unspecified: Secondary | ICD-10-CM | POA: Diagnosis present

## 2021-05-05 DIAGNOSIS — G4730 Sleep apnea, unspecified: Secondary | ICD-10-CM | POA: Diagnosis present

## 2021-05-05 DIAGNOSIS — Z87891 Personal history of nicotine dependence: Secondary | ICD-10-CM | POA: Diagnosis not present

## 2021-05-05 DIAGNOSIS — G629 Polyneuropathy, unspecified: Secondary | ICD-10-CM | POA: Diagnosis present

## 2021-05-05 DIAGNOSIS — Z20822 Contact with and (suspected) exposure to covid-19: Secondary | ICD-10-CM | POA: Diagnosis present

## 2021-05-05 LAB — ECHOCARDIOGRAM COMPLETE
AR max vel: 2.52 cm2
AV Area VTI: 2.73 cm2
AV Area mean vel: 2.48 cm2
AV Mean grad: 2 mmHg
AV Peak grad: 3.6 mmHg
Ao pk vel: 0.95 m/s
Area-P 1/2: 3.81 cm2
Calc EF: 62.7 %
Height: 68 in
S' Lateral: 2.3 cm
Single Plane A2C EF: 66 %
Single Plane A4C EF: 59.9 %
Weight: 3920 oz

## 2021-05-05 LAB — CBC
HCT: 46.3 % (ref 39.0–52.0)
Hemoglobin: 15 g/dL (ref 13.0–17.0)
MCH: 29.2 pg (ref 26.0–34.0)
MCHC: 32.4 g/dL (ref 30.0–36.0)
MCV: 90.3 fL (ref 80.0–100.0)
Platelets: 197 10*3/uL (ref 150–400)
RBC: 5.13 MIL/uL (ref 4.22–5.81)
RDW: 14.2 % (ref 11.5–15.5)
WBC: 8.1 10*3/uL (ref 4.0–10.5)
nRBC: 0 % (ref 0.0–0.2)

## 2021-05-05 LAB — BASIC METABOLIC PANEL
Anion gap: 11 (ref 5–15)
BUN: 32 mg/dL — ABNORMAL HIGH (ref 8–23)
CO2: 21 mmol/L — ABNORMAL LOW (ref 22–32)
Calcium: 9.5 mg/dL (ref 8.9–10.3)
Chloride: 102 mmol/L (ref 98–111)
Creatinine, Ser: 1.41 mg/dL — ABNORMAL HIGH (ref 0.61–1.24)
GFR, Estimated: 54 mL/min — ABNORMAL LOW (ref >60)
Glucose, Bld: 102 mg/dL — ABNORMAL HIGH (ref 70–99)
Potassium: 4.5 mmol/L (ref 3.5–5.1)
Sodium: 134 mmol/L — ABNORMAL LOW (ref 135–145)

## 2021-05-05 LAB — HIV ANTIBODY (ROUTINE TESTING W REFLEX): HIV Screen 4th Generation wRfx: NONREACTIVE

## 2021-05-05 MED ORDER — ALBUTEROL SULFATE (2.5 MG/3ML) 0.083% IN NEBU: 2.5000 mg | INHALATION_SOLUTION | Freq: Four times a day (QID) | RESPIRATORY_TRACT | Status: DC | PRN

## 2021-05-05 MED ORDER — PERFLUTREN LIPID MICROSPHERE
1.0000 mL | INTRAVENOUS | Status: AC | PRN
Start: 2021-05-05 — End: 2021-05-05
  Administered 2021-05-05: 2 mL via INTRAVENOUS
  Filled 2021-05-05: qty 10

## 2021-05-05 MED ORDER — GABAPENTIN 100 MG PO CAPS
100.0000 mg | ORAL_CAPSULE | Freq: Two times a day (BID) | ORAL | Status: DC
  Administered 2021-05-05 – 2021-05-06 (×3): 100 mg via ORAL
  Filled 2021-05-05 (×3): qty 1

## 2021-05-05 MED ORDER — FUROSEMIDE 10 MG/ML IJ SOLN
40.0000 mg | Freq: Two times a day (BID) | INTRAMUSCULAR | Status: DC
Start: 2021-05-05 — End: 2021-05-06
  Administered 2021-05-05 (×2): 40 mg via INTRAVENOUS
  Filled 2021-05-05 (×2): qty 4

## 2021-05-05 NOTE — TOC Progression Note (Signed)
Transition of Care [facility]) - Progression Note    Patient Details  Name: [facility] MRN: [id number] Date of Birth: 1950-07-02  [facility] CM/SW Contact  Zenon Mayo, RN Phone Number: 05/05/2021, 6:08 PM  Clinical Narrative:     Transition of Care [facility]) Screening Note   Patient Details  Name: [facility] Date of Birth: [birth year]   Transition of Care [facility]) CM/SW Contact:    Zenon Mayo, RN Phone Number: 05/05/2021, 6:08 PM    Transition of Care Department [facility]) has reviewed patient and no TOC needs have been identified at this time. We will continue to monitor patient advancement through interdisciplinary progression rounds. If new patient transition needs arise, please place a TOC consult.          Expected Discharge Plan and Services                                                 Social Determinants of Health (SDOH) Interventions    Readmission Risk Interventions No flowsheet data found.

## 2021-05-05 NOTE — ED Notes (Signed)
Echo at bedside

## 2021-05-05 NOTE — Progress Notes (Signed)
PROGRESS NOTE    Randall Montoya  ZOX:[id number] DOB: 01-21-1951 DOA: 05/04/2021 PCP: Mardi Mainland, FNP   Brief Narrative: 70 year old with past medical history significant for congestive heart failure, COPD, hypertension, hyperlipidemia who presents complaining of worsening shortness of breath, 10 pounds weight gain over a month, worsening lower extremity edema.  Chest x-ray show minimal left basilar subsegmental atelectasis with a small left pleural effusion.  He was noted to be in respiratory distress.  Admitted for further evaluation of respiratory distress and shortness of breath.   Assessment & Plan:   Principal Problem:   Respiratory distress Active Problems:   Congestive heart failure (CHF) (HCC)   COPD (chronic obstructive pulmonary disease) (HCC)   Hypertension   Hyperlipidemia   1-Respiratory Distress, shortness of breath: Presume related to acute on chronic congestive heart failure. D-dimer negative, troponins negative. Continue with inhalers and IV Lasix. Improving.    2-Acute on chronic congestive heart failure presumed diastolic: Awaiting echo Change Lasix to 40 mg IV twice daily Urine output 2.8 L Daily weight: 229--- Monitor renal function.  3-CKD stage IIIa: Presented with a creatinine of 1.3, unclear prior baseline Continue to hold ACE. Will adjust Lasix today  4-Hypertension: Continue with Norvasc and metoprolol.  5-Neuropathy: He complaining of feet and hand tingling: Plan to start gabapentin. B12 and TSH normal  6-COPD: Continue with albuterol and Trelegy  7-Chest pain: Troponin x2 negative D-dimer negative      Estimated body mass index is 37.25 kg/m as calculated from the following:   Height as of this encounter: 5\' 8"  (1.727 m).   Weight as of this encounter: 111.1 kg.   DVT prophylaxis: Lovenox Code Status: Full code  family Communication: Care discussed with patient Disposition Plan:  Status is: Observation  The  patient will require care spanning > 2 midnights and should be moved to inpatient because: Continued to require IV Laxis for volume overload       Consultants:  None  Procedures:  ECHO;   Antimicrobials:    Subjective: Patient is alert and conversant, he reported improvement of shortness of breath, still not at baseline for his breathing.  Lower extremity edema has improved.  Objective: Vitals:   05/05/21 0400 05/05/21 0500 05/05/21 0600 05/05/21 0700  BP: (!) 108/49 (!) 119/53 129/70 128/68  Pulse: 63 62 64 65  Resp: 18 13 (!) 23 (!) 23  Temp:      TempSrc:      SpO2: 94% 93% 94% 94%  Weight:      Height:       No intake or output data in the 24 hours ending 05/05/21 0750 Filed Weights   05/04/21 1332  Weight: 111.1 kg    Examination:  General exam: Appears calm and comfortable  Respiratory system: Less tachypnea, bilateral crackles Cardiovascular system: S1 & S2 heard, RRR. No JVD, murmurs, rubs, gallops or clicks. No pedal edema. Gastrointestinal system: Abdomen is nondistended, soft and nontender. No organomegaly or masses felt. Normal bowel sounds heard. Central nervous system: Alert and oriented. No focal neurological deficits. Extremities: Symmetric 5 x 5 power.    Data Reviewed: I have personally reviewed following labs and imaging studies  CBC: Recent Labs  Lab 05/04/21 1355 05/04/21 1511 05/04/21 1512 05/05/21 0413  WBC 7.9  --   --  8.1  NEUTROABS 4.7  --   --   --   HGB 15.0 13.9 15.0 15.0  HCT 46.5 41.0 44.0 46.3  MCV 89.4  --   --  90.3  PLT 205  --   --  329   Basic Metabolic Panel: Recent Labs  Lab 05/04/21 1355 05/04/21 1511 05/04/21 1512 05/04/21 1528 05/05/21 0413  NA 137 132* 138 137 134*  K 4.6 3.9 4.5 4.2 4.5  CL 101 96*  --  101 102  CO2 27  --   --  26 21*  GLUCOSE 92 108*  --  97 102*  BUN 25* 46*  --  26* 32*  CREATININE 1.33* 2.30*  --  1.34* 1.41*  CALCIUM 9.8  --   --  9.8 9.5   GFR: Estimated Creatinine  Clearance: 59.8 mL/min (A) (by C-G formula based on SCr of 1.41 mg/dL (H)). Liver Function Tests: Recent Labs  Lab 05/04/21 1355  AST 22  ALT 34  ALKPHOS 87  BILITOT 0.6  PROT 7.2  ALBUMIN 4.4   No results for input(s): LIPASE, AMYLASE in the last 168 hours. No results for input(s): AMMONIA in the last 168 hours. Coagulation Profile: No results for input(s): INR, PROTIME in the last 168 hours. Cardiac Enzymes: No results for input(s): CKTOTAL, CKMB, CKMBINDEX, TROPONINI in the last 168 hours. BNP (last 3 results) No results for input(s): PROBNP in the last 8760 hours. HbA1C: No results for input(s): HGBA1C in the last 72 hours. CBG: No results for input(s): GLUCAP in the last 168 hours. Lipid Profile: No results for input(s): CHOL, HDL, LDLCALC, TRIG, CHOLHDL, LDLDIRECT in the last 72 hours. Thyroid Function Tests: Recent Labs    05/04/21 1710  TSH 2.542   Anemia Panel: Recent Labs    05/04/21 1710  VITAMINB12 1,104*   Sepsis Labs: No results for input(s): PROCALCITON, LATICACIDVEN in the last 168 hours.  Recent Results (from the past 240 hour(s))  Resp Panel by RT-PCR (Flu A&B, Covid) Nasopharyngeal Swab     Status: None   Collection Time: 05/04/21  3:40 PM   Specimen: Nasopharyngeal Swab; Nasopharyngeal(NP) swabs in vial transport medium  Result Value Ref Range Status   SARS Coronavirus 2 by RT PCR NEGATIVE NEGATIVE Final    Comment: (NOTE) SARS-CoV-2 target nucleic acids are NOT DETECTED.  The SARS-CoV-2 RNA is generally detectable in upper respiratory specimens during the acute phase of infection. The lowest concentration of SARS-CoV-2 viral copies this assay can detect is 138 copies/mL. A negative result does not preclude SARS-Cov-2 infection and should not be used as the sole basis for treatment or other patient management decisions. A negative result may occur with  improper specimen collection/handling, submission of specimen other than nasopharyngeal  swab, presence of viral mutation(s) within the areas targeted by this assay, and inadequate number of viral copies(<138 copies/mL). A negative result must be combined with clinical observations, patient history, and epidemiological information. The expected result is Negative.  Fact Sheet for Patients:  EntrepreneurPulse.com.au  Fact Sheet for Healthcare Providers:  IncredibleEmployment.be  This test is no t yet approved or cleared by the [place] FDA and  has been authorized for detection and/or diagnosis of SARS-CoV-2 by FDA under an Emergency Use Authorization (EUA). This EUA will remain  in effect (meaning this test can be used) for the duration of the COVID-19 declaration under Section 564(b)(1) of the Act, 21 U.S.C.section 360bbb-3(b)(1), unless the authorization is terminated  or revoked sooner.       Influenza A by PCR NEGATIVE NEGATIVE Final   Influenza B by PCR NEGATIVE NEGATIVE Final    Comment: (NOTE) The Xpert Xpress SARS-CoV-2/FLU/RSV plus assay is intended as  an aid in the diagnosis of influenza from Nasopharyngeal swab specimens and should not be used as a sole basis for treatment. Nasal washings and aspirates are unacceptable for Xpert Xpress SARS-CoV-2/FLU/RSV testing.  Fact Sheet for Patients: EntrepreneurPulse.com.au  Fact Sheet for Healthcare Providers: IncredibleEmployment.be  This test is not yet approved or cleared by the [place] FDA and has been authorized for detection and/or diagnosis of SARS-CoV-2 by FDA under an Emergency Use Authorization (EUA). This EUA will remain in effect (meaning this test can be used) for the duration of the COVID-19 declaration under Section 564(b)(1) of the Act, 21 U.S.C. section 360bbb-3(b)(1), unless the authorization is terminated or revoked.  Performed at [facility], [place] [address], [place], [place] [address]           Radiology Studies: DG Chest 2 View  Result Date: 05/04/2021 CLINICAL DATA:  Dyspnea. EXAM: CHEST - 2 VIEW COMPARISON:  None. FINDINGS: The heart size and mediastinal contours are within normal limits. Right lung is clear. Minimal left basilar subsegmental atelectasis is noted with small left pleural effusion. The visualized skeletal structures are unremarkable. IMPRESSION: Minimal left basilar subsegmental atelectasis is noted with small left pleural effusion. Electronically Signed   By: Marijo Conception M.D.   On: 05/04/2021 14:25   VAS [place] LOWER EXTREMITY VENOUS (DVT)  Result Date: 05/04/2021  Lower Venous DVT Study Patient Name:  CLEO VILLAMIZAR  Date of Exam:   05/04/2021 Medical Rec #: [id number]     Accession #:    [id number] Date of Birth: 22-Apr-1951     Patient Gender: M Patient Age:   17 years Exam Location:  [facility] Procedure:      VAS [place] LOWER EXTREMITY VENOUS (DVT) Referring Phys: Jerald Kief Brevyn Ring --------------------------------------------------------------------------------  Indications: Edema, and Edema LT>RT.  Comparison Study: No prior studies. Performing Technologist: Darlin Coco RDMS, RVT  Examination Guidelines: A complete evaluation includes B-mode imaging, spectral Doppler, color Doppler, and power Doppler as needed of all accessible portions of each vessel. Bilateral testing is considered an integral part of a complete examination. Limited examinations for reoccurring indications may be performed as noted. The reflux portion of the exam is performed with the patient in reverse Trendelenburg.  +---------+---------------+---------+-----------+----------+--------------+  RIGHT     Compressibility Phasicity Spontaneity Properties Thrombus Aging  +---------+---------------+---------+-----------+----------+--------------+  CFV       Full            Yes       Yes                                    +---------+---------------+---------+-----------+----------+--------------+   SFJ       Full                                                             +---------+---------------+---------+-----------+----------+--------------+  FV Prox   Full                                                             +---------+---------------+---------+-----------+----------+--------------+  FV Mid    Full                                                             +---------+---------------+---------+-----------+----------+--------------+  FV Distal Full                                                             +---------+---------------+---------+-----------+----------+--------------+  PFV       Full                                                             +---------+---------------+---------+-----------+----------+--------------+  POP       Full            Yes       Yes                                    +---------+---------------+---------+-----------+----------+--------------+  PTV       Full                                                             +---------+---------------+---------+-----------+----------+--------------+  PERO      Full                                                             +---------+---------------+---------+-----------+----------+--------------+  Gastroc   Full                                                             +---------+---------------+---------+-----------+----------+--------------+   +---------+---------------+---------+-----------+----------+--------------+  LEFT      Compressibility Phasicity Spontaneity Properties Thrombus Aging  +---------+---------------+---------+-----------+----------+--------------+  CFV       Full            Yes       Yes                                    +---------+---------------+---------+-----------+----------+--------------+  SFJ       Full                                                             +---------+---------------+---------+-----------+----------+--------------+  FV Prox   Full                                                              +---------+---------------+---------+-----------+----------+--------------+  FV Mid    Full                                                             +---------+---------------+---------+-----------+----------+--------------+  FV Distal Full                                                             +---------+---------------+---------+-----------+----------+--------------+  PFV       Full                                                             +---------+---------------+---------+-----------+----------+--------------+  POP       Full            Yes       Yes                                    +---------+---------------+---------+-----------+----------+--------------+  PTV       Full                                                             +---------+---------------+---------+-----------+----------+--------------+  PERO      Full                                                             +---------+---------------+---------+-----------+----------+--------------+  Gastroc   Full                                                             +---------+---------------+---------+-----------+----------+--------------+     Summary: RIGHT: - There is no evidence of deep vein thrombosis in the lower extremity.  - No cystic structure found in the popliteal fossa.  LEFT: - There is no evidence of deep vein thrombosis in the lower extremity.  - No cystic structure found in the popliteal fossa.  *See table(s) above for measurements and  observations. Electronically signed by Monica Martinez MD on 05/04/2021 at 7:57:43 PM.    Final         Scheduled Meds:  albuterol  2.5 mg Nebulization Q6H   amLODipine  10 mg Oral Daily   atorvastatin  80 mg Oral Daily   clopidogrel  75 mg Oral Daily   enoxaparin (LOVENOX) injection  0.5 mg/kg Subcutaneous Q24H   fluticasone furoate-vilanterol  1 puff Inhalation Daily   furosemide  40 mg Intravenous Q12H   metoprolol tartrate  50 mg Oral BID    oxybutynin  5 mg Oral Daily   sodium chloride flush  3 mL Intravenous Q12H   tamsulosin  0.4 mg Oral Daily   umeclidinium bromide  1 puff Inhalation Daily   Continuous Infusions:  sodium chloride       LOS: 0 days    Time spent: 35 minutes    Serafina Topham A Kaisha Wachob, MD [facility]   If 7PM-7AM, please contact night-coverage [email or web address]  05/05/2021, 7:50 AM

## 2021-05-05 NOTE — ED Notes (Signed)
Patient resting in stretcher comfortably. Eyes closed, Equal chest rise and fall. Patient alert to verbal stimuli. Call bell in reach, Stretcher in low and locked position. Side rails up x2.   

## 2021-05-05 NOTE — Progress Notes (Signed)
Heart Failure Nurse Navigator Progress Note  Following this hospitalization to assess for HV TOC readiness. ECHO results pending at this time.   Will attempt to see after results posted.   Pricilla Holm, MSN, RN Heart Failure Nurse Navigator [phone]

## 2021-05-05 NOTE — Evaluation (Signed)
Physical Therapy Evaluation Patient Details Name: Randall Montoya MRN: [id number] DOB: 05/13/1951 Today's Date: 05/05/2021  History of Present Illness  Pt is a 70 y/o male admitted secondary to worsening SOB. Thought to be secondary to CHF exacerbation. PMH includes schizophrenia, CHF, COPD, CKD, and HTN.  Clinical Impression  Pt admitted secondary to problem above with deficits below. Pt requiring min guard A for mobility tasks using rollator. Mild SOB noted, but pt reports breathing is much improved. Anticipate pt will progress well and will not require follow up PT at d/c. Will continue to follow acutely.        Recommendations for follow up therapy are one component of a multi-disciplinary discharge planning process, led by the attending physician.  Recommendations may be updated based on patient status, additional functional criteria and insurance authorization.  Follow Up Recommendations No PT follow up    Assistance Recommended at Discharge Intermittent Supervision/Assistance  Functional Status Assessment Patient has had a recent decline in their functional status and demonstrates the ability to make significant improvements in function in a reasonable and predictable amount of time.  Equipment Recommendations  None recommended by PT    Recommendations for Other Services       Precautions / Restrictions Precautions Precautions: Fall Restrictions Weight Bearing Restrictions: No      Mobility  Bed Mobility Overal bed mobility: Modified Independent                  Transfers Overall transfer level: Needs assistance Equipment used: Rollator (4 wheels) Transfers: Sit to/from Stand Sit to Stand: Min guard           General transfer comment: Min guard for safety.    Ambulation/Gait Ambulation/Gait assistance: Min guard Gait Distance (Feet): 125 Feet Assistive device: Rollator (4 wheels) Gait Pattern/deviations: Step-through pattern;Decreased stride  length Gait velocity: Decreased     General Gait Details: Min guard for safety. Mild SOB noted.  Stairs            Wheelchair Mobility    Modified Rankin (Stroke Patients Only)       Balance Overall balance assessment: Needs assistance Sitting-balance support: No upper extremity supported;Feet supported Sitting balance-Leahy Scale: Good     Standing balance support: Bilateral upper extremity supported Standing balance-Leahy Scale: Poor Standing balance comment: reliant on BUE support                             Pertinent Vitals/Pain Pain Assessment: Faces Faces Pain Scale: Hurts little more Pain Location: feet Pain Descriptors / Indicators: Grimacing;Guarding Pain Intervention(s): Limited activity within patient's tolerance;Monitored during session;Repositioned    Home Living Family/patient expects to be discharged to:: Private residence Living Arrangements: Non-relatives/Friends Available Help at Discharge: Friend(s);Available 24 hours/day Type of Home: House Home Access: Stairs to enter Entrance Stairs-Rails: None Entrance Stairs-Number of Steps: 2   Home Layout: One level Home Equipment: Rollator (4 wheels)      Prior Function Prior Level of Function : Independent/Modified Independent             Mobility Comments: Uses rollator       Hand Dominance        Extremity/Trunk Assessment   Upper Extremity Assessment Upper Extremity Assessment: Overall WFL for tasks assessed    Lower Extremity Assessment Lower Extremity Assessment: Generalized weakness    Cervical / Trunk Assessment Cervical / Trunk Assessment: Normal  Communication   Communication: No difficulties  Cognition Arousal/Alertness: Awake/alert  Behavior During Therapy: WFL for tasks assessed/performed Overall Cognitive Status: No family/caregiver present to determine baseline cognitive functioning                                           General Comments      Exercises     Assessment/Plan    PT Assessment Patient needs continued PT services  PT Problem List Decreased strength;Decreased activity tolerance;Decreased balance;Decreased mobility;Cardiopulmonary status limiting activity       PT Treatment Interventions DME instruction;Gait training;Stair training    PT Goals (Current goals can be found in the Care Plan section)  Acute Rehab PT Goals Patient Stated Goal: to go home PT Goal Formulation: With patient Time For Goal Achievement: 05/19/21 Potential to Achieve Goals: Good    Frequency Min 3X/week   Barriers to discharge        Co-evaluation               AM-PAC PT "6 Clicks" Mobility  Outcome Measure Help needed turning from your back to your side while in a flat bed without using bedrails?: None Help needed moving from lying on your back to sitting on the side of a flat bed without using bedrails?: None Help needed moving to and from a bed to a chair (including a wheelchair)?: A Little Help needed standing up from a chair using your arms (e.g., wheelchair or bedside chair)?: A Little Help needed to walk in hospital room?: A Little Help needed climbing 3-5 steps with a railing? : A Lot 6 Click Score: 19    End of Session Equipment Utilized During Treatment: Gait belt Activity Tolerance: Patient tolerated treatment well Patient left: in bed;with call bell/phone within reach;with bed alarm set Nurse Communication: Mobility status PT Visit Diagnosis: Unsteadiness on feet (R26.81);Muscle weakness (generalized) (M62.81)    Time: 4196-2229 PT Time Calculation (min) (ACUTE ONLY): 23 min   Charges:   PT Evaluation $PT Eval Low Complexity: 1 Low PT Treatments $Gait Training: 8-22 mins        Lou Miner, DPT  Acute Rehabilitation Services  Pager: [phone] Office: [phone]   Rudean Hitt 05/05/2021, 4:41 PM

## 2021-05-06 LAB — BASIC METABOLIC PANEL
Anion gap: 8 (ref 5–15)
BUN: 32 mg/dL — ABNORMAL HIGH (ref 8–23)
CO2: 27 mmol/L (ref 22–32)
Calcium: 9 mg/dL (ref 8.9–10.3)
Chloride: 98 mmol/L (ref 98–111)
Creatinine, Ser: 1.46 mg/dL — ABNORMAL HIGH (ref 0.61–1.24)
GFR, Estimated: 52 mL/min — ABNORMAL LOW (ref >60)
Glucose, Bld: 113 mg/dL — ABNORMAL HIGH (ref 70–99)
Potassium: 4.2 mmol/L (ref 3.5–5.1)
Sodium: 133 mmol/L — ABNORMAL LOW (ref 135–145)

## 2021-05-06 MED ORDER — FUROSEMIDE 40 MG PO TABS
80.0000 mg | ORAL_TABLET | Freq: Every day | ORAL | Status: DC
  Administered 2021-05-06: 80 mg via ORAL
  Filled 2021-05-06: qty 2

## 2021-05-06 MED ORDER — PANTOPRAZOLE SODIUM 40 MG PO TBEC
40.0000 mg | DELAYED_RELEASE_TABLET | Freq: Every day | ORAL | Status: DC
  Administered 2021-05-06: 40 mg via ORAL
  Filled 2021-05-06: qty 1

## 2021-05-06 MED ORDER — GUAIFENESIN ER 600 MG PO TB12
600.0000 mg | ORAL_TABLET | Freq: Two times a day (BID) | ORAL | Status: DC
  Administered 2021-05-06: 600 mg via ORAL
  Filled 2021-05-06: qty 1

## 2021-05-06 MED ORDER — PANTOPRAZOLE SODIUM 40 MG PO TBEC
40.0000 mg | DELAYED_RELEASE_TABLET | Freq: Every day | ORAL | 0 refills | Status: DC
Start: 2021-05-06 — End: 2021-09-12

## 2021-05-06 MED ORDER — GABAPENTIN 100 MG PO CAPS: 100.0000 mg | ORAL_CAPSULE | Freq: Two times a day (BID) | ORAL | 1 refills | Status: AC

## 2021-05-06 MED ORDER — GUAIFENESIN ER 600 MG PO TB12: 600.0000 mg | ORAL_TABLET | Freq: Two times a day (BID) | ORAL | 0 refills | Status: DC

## 2021-05-06 NOTE — Discharge Summary (Signed)
Physician Discharge Summary  Giulian Goldring SEG:[id number] DOB: 12-Oct-1950 DOA: 05/04/2021  PCP: Mardi Mainland, FNP  Admit date: 05/04/2021 Discharge date: 05/06/2021  Admitted From: Home  Disposition:  Home   Recommendations for Outpatient Follow-up:  Follow up with PCP in 1-2 weeks Please obtain BMP/CBC in one week Need Bmet if renal function stable, improved resume lisinopril.    Discharge Condition: Stable.  CODE STATUS: Full code Diet recommendation: Heart Healthy  Brief/Interim Summary: 70 year old with past medical history significant for congestive heart failure, COPD, hypertension, hyperlipidemia who presents complaining of worsening shortness of breath, 10 pounds weight gain over a month, worsening lower extremity edema.  Chest x-ray show minimal left basilar subsegmental atelectasis with a small left pleural effusion.  He was noted to be in respiratory distress.   Admitted for further evaluation of respiratory distress and shortness of breath.  Discharge Diagnoses:  Principal Problem:   Respiratory distress Active Problems:   Congestive heart failure (CHF) (HCC)   COPD (chronic obstructive pulmonary disease) (HCC)   Hypertension   Hyperlipidemia   Heart failure (HCC)    1-Respiratory Distress, shortness of breath: Presume related to acute on chronic congestive heart failure. D-dimer negative, troponins negative. Continue with inhalers and IV Lasix. Improving.  Report cough, started guaifenesin.    2-Acute on chronic Diastolic congestive heart failure exacerbation.  ECHO normal EF, diastolic dysfunction.  Treated Lasix to 40 mg IV twice daily Urine output 4.9 L Daily weight: --245---229---229 Monitor renal function. Plan to transition to oral lasix today, 80 mg daily.  Advised patient to weight daily, if increase more than 2--3 pound in 24 hours he will need extra 40 mg lasix.   3-CKD stage IIIa: Presented with a creatinine of 1.3, unclear prior  baseline Continue to hold ACE. Cr stable at 1.4    4-Hypertension: Continue with Norvasc and metoprolol.   5-Neuropathy: He complaining of feet and hand tingling: Plan to start gabapentin. B12 and TSH normal   6-COPD: Continue with albuterol and Trelegy   7-Chest pain: Troponin x2 negative D-dimer negative      Stable for discharge    Discharge Instructions  Discharge Instructions     Diet - low sodium heart healthy   Complete by: As directed    Increase activity slowly   Complete by: As directed       Allergies as of 05/06/2021   No Known Allergies      Medication List     STOP taking these medications    ibuprofen 800 MG tablet Commonly known as: ADVIL   lisinopril 40 MG tablet Commonly known as: ZESTRIL       TAKE these medications    albuterol 108 (90 Base) MCG/ACT inhaler Commonly known as: VENTOLIN HFA Inhale 1 puff into the lungs every 6 (six) hours as needed for wheezing or shortness of breath.   amLODipine 10 MG tablet Commonly known as: NORVASC Take 10 mg by mouth daily.   aspirin EC 81 MG tablet Take 81 mg by mouth daily. Swallow whole.   atorvastatin 80 MG tablet Commonly known as: LIPITOR Take 80 mg by mouth daily.   clopidogrel 75 MG tablet Commonly known as: PLAVIX Take 75 mg by mouth daily.   furosemide 80 MG tablet Commonly known as: LASIX Take 80 mg by mouth daily.   gabapentin 100 MG capsule Commonly known as: NEURONTIN Take 1 capsule (100 mg total) by mouth 2 (two) times daily.   guaiFENesin 600 MG 12 hr tablet Commonly  known as: MUCINEX Take 1 tablet (600 mg total) by mouth 2 (two) times daily.   metoprolol tartrate 50 MG tablet Commonly known as: LOPRESSOR Take 50 mg by mouth 2 (two) times daily.   multivitamin with minerals Tabs tablet Take 1 tablet by mouth daily.   nitroGLYCERIN 0.4 MG SL tablet Commonly known as: NITROSTAT Place 0.4 mg under the tongue every 5 (five) minutes as needed for chest pain.    oxybutynin 5 MG 24 hr tablet Commonly known as: DITROPAN-XL Take 5 mg by mouth daily.   pantoprazole 40 MG tablet Commonly known as: PROTONIX Take 1 tablet (40 mg total) by mouth daily.   tamsulosin 0.4 MG Caps capsule Commonly known as: FLOMAX Take 0.4 mg by mouth daily.   Trelegy Ellipta 100-62.5-25 MCG/ACT Aepb Generic drug: Fluticasone-Umeclidin-Vilant Inhale 1 puff into the lungs daily.        Follow-up Information     Mardi Mainland, FNP Follow up in 1 week(s).   Specialty: Nurse Practitioner Why: You need lab work. Contact information: [address] [place] [address] [phone]                No Known Allergies  Consultations: None   Procedures/Studies: DG Chest 2 View  Result Date: 05/04/2021 CLINICAL DATA:  Dyspnea. EXAM: CHEST - 2 VIEW COMPARISON:  None. FINDINGS: The heart size and mediastinal contours are within normal limits. Right lung is clear. Minimal left basilar subsegmental atelectasis is noted with small left pleural effusion. The visualized skeletal structures are unremarkable. IMPRESSION: Minimal left basilar subsegmental atelectasis is noted with small left pleural effusion. Electronically Signed   By: Marijo Conception M.D.   On: 05/04/2021 14:25   ECHOCARDIOGRAM COMPLETE  Result Date: 05/05/2021    ECHOCARDIOGRAM REPORT   Patient Name:   SHEMUEL HARKLEROAD Date of Exam: 05/05/2021 Medical Rec #:  [id number]    Height:       68.0 in Accession #:    [id number]   Weight:       245.0 lb Date of Birth:  Aug 25, 1950    BSA:          2.228 m Patient Age:    70 years     BP:           119/53 mmHg Patient Gender: M            HR:           64 bpm. Exam Location:  Inpatient Procedure: 2D Echo, Cardiac Doppler and Color Doppler Indications:    Dyspnea  History:        Patient has no prior history of Echocardiogram examinations.                 CHF, COPD; Risk Factors:Hypertension.  Sonographer:    Glo Herring Referring Phys: [phone]  Jenevie Casstevens A Clemencia Helzer  Sonographer Comments: Technically challenging study due to limited acoustic windows and suboptimal apical window. IMPRESSIONS  1. Left ventricular ejection fraction, by estimation, is 60 to 65%. The left ventricle has normal function. The left ventricle has no regional wall motion abnormalities. Left ventricular diastolic parameters are consistent with Grade I diastolic dysfunction (impaired relaxation).  2. Right ventricular systolic function is normal. The right ventricular size is normal. Tricuspid regurgitation signal is inadequate for assessing PA pressure.  3. Left atrial size was mildly dilated.  4. The mitral valve is grossly normal. No evidence of mitral valve regurgitation.  5. The aortic valve is grossly normal.  Aortic valve regurgitation is not visualized.  6. The inferior vena cava IVC not well visualized. Comparison(s): No prior Echocardiogram. Conclusion(s)/Recommendation(s): Otherwise normal echocardiogram, with minor abnormalities described in the report. FINDINGS  Left Ventricle: Left ventricular ejection fraction, by estimation, is 60 to 65%. The left ventricle has normal function. The left ventricle has no regional wall motion abnormalities. Definity contrast agent was given IV to delineate the left ventricular  endocardial borders. The left ventricular internal cavity size was normal in size. There is no left ventricular hypertrophy. Left ventricular diastolic parameters are consistent with Grade I diastolic dysfunction (impaired relaxation). Right Ventricle: The right ventricular size is normal. No increase in right ventricular wall thickness. Right ventricular systolic function is normal. Tricuspid regurgitation signal is inadequate for assessing PA pressure. Left Atrium: Left atrial size was mildly dilated. Right Atrium: Right atrial size was normal in size. Pericardium: There is no evidence of pericardial effusion. Mitral Valve: The mitral valve is grossly normal. No  evidence of mitral valve regurgitation. Tricuspid Valve: The tricuspid valve is not well visualized. Tricuspid valve regurgitation is not demonstrated. Aortic Valve: The aortic valve is grossly normal. Aortic valve regurgitation is not visualized. Aortic valve mean gradient measures 2.0 mmHg. Aortic valve peak gradient measures 3.6 mmHg. Aortic valve area, by VTI measures 2.73 cm. Pulmonic Valve: The pulmonic valve was grossly normal. Pulmonic valve regurgitation is trivial. Aorta: The aortic root and ascending aorta are structurally normal, with no evidence of dilitation. Venous: The inferior vena cava IVC not well visualized. IAS/Shunts: No atrial level shunt detected by color flow Doppler.  LEFT VENTRICLE PLAX 2D LVIDd:         4.40 cm      Diastology LVIDs:         2.30 cm      LV e' medial:    5.55 cm/s LV PW:         1.00 cm      LV E/e' medial:  11.3 LV IVS:        1.00 cm      LV e' lateral:   9.36 cm/s LVOT diam:     2.20 cm      LV E/e' lateral: 6.7 LV SV:         58 LV SV Index:   26 LVOT Area:     3.80 cm  LV Volumes (MOD) LV vol d, MOD A2C: 124.0 ml LV vol d, MOD A4C: 95.7 ml LV vol s, MOD A2C: 42.2 ml LV vol s, MOD A4C: 38.4 ml LV SV MOD A2C:     81.8 ml LV SV MOD A4C:     95.7 ml LV SV MOD BP:      70.3 ml RIGHT VENTRICLE RV S prime:     10.00 cm/s LEFT ATRIUM             Index LA diam:        3.70 cm 1.66 cm/m LA Vol (A2C):   73.6 ml 33.04 ml/m LA Vol (A4C):   73.5 ml 32.99 ml/m LA Biplane Vol: 74.0 ml 33.22 ml/m  AORTIC VALVE                    PULMONIC VALVE AV Area (Vmax):    2.52 cm     PV Vmax:       0.91 m/s AV Area (Vmean):   2.48 cm     PV Peak grad:  3.3 mmHg AV Area (VTI):  2.73 cm AV Vmax:           95.40 cm/s AV Vmean:          62.800 cm/s AV VTI:            0.212 m AV Peak Grad:      3.6 mmHg AV Mean Grad:      2.0 mmHg LVOT Vmax:         63.30 cm/s LVOT Vmean:        41.000 cm/s LVOT VTI:          0.152 m LVOT/AV VTI ratio: 0.72  AORTA Ao Root diam: 3.40 cm Ao Asc diam:   3.40 cm MITRAL VALVE MV Area (PHT): 3.81 cm    SHUNTS MV Decel Time: 199 msec    Systemic VTI:  0.15 m MV E velocity: 62.60 cm/s  Systemic Diam: 2.20 cm MV A velocity: 64.30 cm/s MV E/A ratio:  0.97 Landscape architect signed by Phineas Inches Signature Date/Time: 05/05/2021/1:53:53 PM    Final    XR Cervical Spine 2 or 3 views  Result Date: 04/20/2021 AP lateral cervical spine x-rays demonstrate spondylosis at C5-6 with disc base narrowing and spurring with sparing of other levels. 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  VAS [place] LOWER EXTREMITY VENOUS (DVT)  Result Date: 05/04/2021  Lower Venous DVT Study Patient Name:  NATHANUEL CABREJA  Date of Exam:   05/04/2021 Medical Rec #: [id number]     Accession #:    [id number] Date of Birth: May 04, 1951     Patient Gender: M Patient Age:   48 years Exam Location:  [facility] Procedure:      VAS [place] LOWER EXTREMITY VENOUS (DVT) Referring Phys: Jerald Kief Leandra Vanderweele --------------------------------------------------------------------------------  Indications: Edema, and Edema LT>RT.  Comparison Study: No prior studies. Performing Technologist: Darlin Coco RDMS, RVT  Examination Guidelines: A complete evaluation includes B-mode imaging, spectral Doppler, color Doppler, and power Doppler as needed of all accessible portions of each vessel. Bilateral testing is considered an integral part of a complete examination. Limited examinations for reoccurring indications may be performed as noted. The reflux portion of the exam is performed with the patient in reverse Trendelenburg.  +---------+---------------+---------+-----------+----------+--------------+  RIGHT     Compressibility Phasicity Spontaneity Properties Thrombus Aging  +---------+---------------+---------+-----------+----------+--------------+  CFV       Full            Yes       Yes                                     +---------+---------------+---------+-----------+----------+--------------+  SFJ       Full                                                             +---------+---------------+---------+-----------+----------+--------------+  FV Prox   Full                                                             +---------+---------------+---------+-----------+----------+--------------+  FV Mid    Full                                                             +---------+---------------+---------+-----------+----------+--------------+  FV Distal Full                                                             +---------+---------------+---------+-----------+----------+--------------+  PFV       Full                                                             +---------+---------------+---------+-----------+----------+--------------+  POP       Full            Yes       Yes                                    +---------+---------------+---------+-----------+----------+--------------+  PTV       Full                                                             +---------+---------------+---------+-----------+----------+--------------+  PERO      Full                                                             +---------+---------------+---------+-----------+----------+--------------+  Gastroc   Full                                                             +---------+---------------+---------+-----------+----------+--------------+   +---------+---------------+---------+-----------+----------+--------------+  LEFT      Compressibility Phasicity Spontaneity Properties Thrombus Aging  +---------+---------------+---------+-----------+----------+--------------+  CFV       Full            Yes       Yes                                    +---------+---------------+---------+-----------+----------+--------------+  SFJ       Full                                                              +---------+---------------+---------+-----------+----------+--------------+  FV Prox   Full                                                             +---------+---------------+---------+-----------+----------+--------------+  FV Mid    Full                                                             +---------+---------------+---------+-----------+----------+--------------+  FV Distal Full                                                             +---------+---------------+---------+-----------+----------+--------------+  PFV       Full                                                             +---------+---------------+---------+-----------+----------+--------------+  POP       Full            Yes       Yes                                    +---------+---------------+---------+-----------+----------+--------------+  PTV       Full                                                             +---------+---------------+---------+-----------+----------+--------------+  PERO      Full                                                             +---------+---------------+---------+-----------+----------+--------------+  Gastroc   Full                                                             +---------+---------------+---------+-----------+----------+--------------+     Summary: RIGHT: - There is no evidence of deep vein thrombosis in the lower extremity.  - No cystic structure found in the popliteal fossa.  LEFT: - There is no evidence of deep vein thrombosis in the lower extremity.  - No cystic structure found in the popliteal fossa.  *See table(s) above for measurements and observations.  Electronically signed by Monica Martinez MD on 05/04/2021 at 7:57:43 PM.    Final      Subjective: Breathing better, denies chest pain report cough   Discharge Exam: Vitals:   05/06/21 0428 05/06/21 0819  BP: 118/65   Pulse: 71   Resp: 20   Temp: 97.7 F (36.5 C)   SpO2: 91% 94%     General: Pt is alert,  awake, not in acute distress Cardiovascular: RRR, S1/S2 +, no rubs, no gallops Respiratory: CTA bilaterally, no wheezing, no rhonchi Abdominal: Soft, NT, ND, bowel sounds + Extremities: no edema, no cyanosis    The results of significant diagnostics from this hospitalization (including imaging, microbiology, ancillary and laboratory) are listed below for reference.     Microbiology: Recent Results (from the past 240 hour(s))  Resp Panel by RT-PCR (Flu A&B, Covid) Nasopharyngeal Swab     Status: None   Collection Time: 05/04/21  3:40 PM   Specimen: Nasopharyngeal Swab; Nasopharyngeal(NP) swabs in vial transport medium  Result Value Ref Range Status   SARS Coronavirus 2 by RT PCR NEGATIVE NEGATIVE Final    Comment: (NOTE) SARS-CoV-2 target nucleic acids are NOT DETECTED.  The SARS-CoV-2 RNA is generally detectable in upper respiratory specimens during the acute phase of infection. The lowest concentration of SARS-CoV-2 viral copies this assay can detect is 138 copies/mL. A negative result does not preclude SARS-Cov-2 infection and should not be used as the sole basis for treatment or other patient management decisions. A negative result may occur with  improper specimen collection/handling, submission of specimen other than nasopharyngeal swab, presence of viral mutation(s) within the areas targeted by this assay, and inadequate number of viral copies(<138 copies/mL). A negative result must be combined with clinical observations, patient history, and epidemiological information. The expected result is Negative.  Fact Sheet for Patients:  EntrepreneurPulse.com.au  Fact Sheet for Healthcare Providers:  IncredibleEmployment.be  This test is no t yet approved or cleared by the [place] FDA and  has been authorized for detection and/or diagnosis of SARS-CoV-2 by FDA under an Emergency Use Authorization (EUA). This EUA will remain  in effect  (meaning this test can be used) for the duration of the COVID-19 declaration under Section 564(b)(1) of the Act, 21 U.S.C.section 360bbb-3(b)(1), unless the authorization is terminated  or revoked sooner.       Influenza A by PCR NEGATIVE NEGATIVE Final   Influenza B by PCR NEGATIVE NEGATIVE Final    Comment: (NOTE) The Xpert Xpress SARS-CoV-2/FLU/RSV plus assay is intended as an aid in the diagnosis of influenza from Nasopharyngeal swab specimens and should not be used as a sole basis for treatment. Nasal washings and aspirates are unacceptable for Xpert Xpress SARS-CoV-2/FLU/RSV testing.  Fact Sheet for Patients: EntrepreneurPulse.com.au  Fact Sheet for Healthcare Providers: IncredibleEmployment.be  This test is not yet approved or cleared by the [place] FDA and has been authorized for detection and/or diagnosis of SARS-CoV-2 by FDA under an Emergency Use Authorization (EUA). This EUA will remain in effect (meaning this test can be used) for the duration of the COVID-19 declaration under Section 564(b)(1) of the Act, 21 U.S.C. section 360bbb-3(b)(1), unless the authorization is terminated or revoked.  Performed at [facility], [place] [address], [place], [place] [address]      Labs: BNP (last 3 results) Recent Labs    05/04/21 1355  BNP 10.2   Basic Metabolic Panel: Recent Labs  Lab 05/04/21 1355 05/04/21 1511 05/04/21 1512 05/04/21 1528 05/05/21  0413 05/06/21 0217  NA 137 132* 138 137 134* 133*  K 4.6 3.9 4.5 4.2 4.5 4.2  CL 101 96*  --  101 102 98  CO2 27  --   --  26 21* 27  GLUCOSE 92 108*  --  97 102* 113*  BUN 25* 46*  --  26* 32* 32*  CREATININE 1.33* 2.30*  --  1.34* 1.41* 1.46*  CALCIUM 9.8  --   --  9.8 9.5 9.0   Liver Function Tests: Recent Labs  Lab 05/04/21 1355  AST 22  ALT 34  ALKPHOS 87  BILITOT 0.6  PROT 7.2  ALBUMIN 4.4   No results for input(s): LIPASE, AMYLASE in the last 168  hours. No results for input(s): AMMONIA in the last 168 hours. CBC: Recent Labs  Lab 05/04/21 1355 05/04/21 1511 05/04/21 1512 05/05/21 0413  WBC 7.9  --   --  8.1  NEUTROABS 4.7  --   --   --   HGB 15.0 13.9 15.0 15.0  HCT 46.5 41.0 44.0 46.3  MCV 89.4  --   --  90.3  PLT 205  --   --  197   Cardiac Enzymes: No results for input(s): CKTOTAL, CKMB, CKMBINDEX, TROPONINI in the last 168 hours. BNP: Invalid input(s): POCBNP CBG: No results for input(s): GLUCAP in the last 168 hours. D-Dimer Recent Labs    05/04/21 1710  DDIMER 0.43   Hgb A1c No results for input(s): HGBA1C in the last 72 hours. Lipid Profile No results for input(s): CHOL, HDL, LDLCALC, TRIG, CHOLHDL, LDLDIRECT in the last 72 hours. Thyroid function studies Recent Labs    05/04/21 1710  TSH 2.542   Anemia work up Recent Labs    05/04/21 1710  VITAMINB12 1,104*   Urinalysis No results found for: COLORURINE, APPEARANCEUR, LABSPEC, [place], GLUCOSEU, HGBUR, BILIRUBINUR, KETONESUR, PROTEINUR, UROBILINOGEN, NITRITE, LEUKOCYTESUR Sepsis Labs Invalid input(s): PROCALCITONIN,  WBC,  LACTICIDVEN Microbiology Recent Results (from the past 240 hour(s))  Resp Panel by RT-PCR (Flu A&B, Covid) Nasopharyngeal Swab     Status: None   Collection Time: 05/04/21  3:40 PM   Specimen: Nasopharyngeal Swab; Nasopharyngeal(NP) swabs in vial transport medium  Result Value Ref Range Status   SARS Coronavirus 2 by RT PCR NEGATIVE NEGATIVE Final    Comment: (NOTE) SARS-CoV-2 target nucleic acids are NOT DETECTED.  The SARS-CoV-2 RNA is generally detectable in upper respiratory specimens during the acute phase of infection. The lowest concentration of SARS-CoV-2 viral copies this assay can detect is 138 copies/mL. A negative result does not preclude SARS-Cov-2 infection and should not be used as the sole basis for treatment or other patient management decisions. A negative result may occur with  improper specimen  collection/handling, submission of specimen other than nasopharyngeal swab, presence of viral mutation(s) within the areas targeted by this assay, and inadequate number of viral copies(<138 copies/mL). A negative result must be combined with clinical observations, patient history, and epidemiological information. The expected result is Negative.  Fact Sheet for Patients:  EntrepreneurPulse.com.au  Fact Sheet for Healthcare Providers:  IncredibleEmployment.be  This test is no t yet approved or cleared by the [place] FDA and  has been authorized for detection and/or diagnosis of SARS-CoV-2 by FDA under an Emergency Use Authorization (EUA). This EUA will remain  in effect (meaning this test can be used) for the duration of the COVID-19 declaration under Section 564(b)(1) of the Act, 21 U.S.C.section 360bbb-3(b)(1), unless the authorization is terminated  or  revoked sooner.       Influenza A by PCR NEGATIVE NEGATIVE Final   Influenza B by PCR NEGATIVE NEGATIVE Final    Comment: (NOTE) The Xpert Xpress SARS-CoV-2/FLU/RSV plus assay is intended as an aid in the diagnosis of influenza from Nasopharyngeal swab specimens and should not be used as a sole basis for treatment. Nasal washings and aspirates are unacceptable for Xpert Xpress SARS-CoV-2/FLU/RSV testing.  Fact Sheet for Patients: EntrepreneurPulse.com.au  Fact Sheet for Healthcare Providers: IncredibleEmployment.be  This test is not yet approved or cleared by the [place] FDA and has been authorized for detection and/or diagnosis of SARS-CoV-2 by FDA under an Emergency Use Authorization (EUA). This EUA will remain in effect (meaning this test can be used) for the duration of the COVID-19 declaration under Section 564(b)(1) of the Act, 21 U.S.C. section 360bbb-3(b)(1), unless the authorization is terminated or revoked.  Performed at [facility], [place] [address], [place], [place] [address]      Time coordinating discharge: 40 minutes  SIGNED:   Elmarie Shiley, MD  [facility]

## 2021-05-06 NOTE — Progress Notes (Signed)
Mobility Specialist Progress Note:   05/06/21 1145  Mobility  Activity Ambulated in room  Level of Assistance Standby assist, set-up cues, supervision of patient - no hands on  Assistive Device None  Distance Ambulated (ft) 20 ft  Mobility Ambulated with assistance in room  Mobility Response Tolerated well  Mobility performed by Mobility specialist  $Mobility charge 1 Mobility   Pt received in bathroom. Ambulated to sink then to bed. No complaints of pain. Pt left in bed with call bell in reach and all needs met.   [facility] Public librarian Phone [phone] Secondary Phone [phone]

## 2021-05-06 NOTE — Discharge Instructions (Addendum)
Please weight yourself every day, if your weight increases more than 2-3 pounds in 24 hour, take extra 40 mg lasix in the afternoon.

## 2021-05-06 NOTE — Progress Notes (Signed)
Patient discharged: Home with friend.  Left via: Wheelchair  Discharge paperwork reviewed and given: to patient and family. Teach back completed. IV and telemetry disconnected. Belongings given to patient.

## 2021-05-10 ENCOUNTER — Ambulatory Visit: Payer: Medicare Other | Admitting: Physical Medicine and Rehabilitation

## 2021-05-11 ENCOUNTER — Ambulatory Visit: Payer: Self-pay

## 2021-05-11 ENCOUNTER — Ambulatory Visit (INDEPENDENT_AMBULATORY_CARE_PROVIDER_SITE_OTHER): Payer: Medicare Other | Admitting: Physical Medicine and Rehabilitation

## 2021-05-11 ENCOUNTER — Other Ambulatory Visit: Payer: Self-pay

## 2021-05-11 ENCOUNTER — Encounter: Payer: Self-pay | Admitting: Physical Medicine and Rehabilitation

## 2021-05-11 VITALS — BP 159/80 | HR 71

## 2021-05-11 DIAGNOSIS — M5416 Radiculopathy, lumbar region: Secondary | ICD-10-CM

## 2021-05-11 MED ORDER — METHYLPREDNISOLONE ACETATE 80 MG/ML IJ SUSP
80.0000 mg | Freq: Once | INTRAMUSCULAR | Status: AC
  Administered 2021-05-11: 09:00:00 80 mg

## 2021-05-11 NOTE — Patient Instructions (Signed)

## 2021-05-11 NOTE — Progress Notes (Signed)
Left S1 tf esi Pt state lower back pain that travels down his left leg. Pt state walking and sitting makes the pain worse. Pt state he takes pain meds to help ease his pain.  Numeric Pain Rating Scale and Functional Assessment Average Pain 4   In the last MONTH (on 0-10 scale) has pain interfered with the following?  1. General activity like being  able to carry out your everyday physical activities such as walking, climbing stairs, carrying groceries, or moving a chair?  Rating(7)   +Driver, +BT, -Dye Allergies. Plavix

## 2021-05-12 NOTE — Progress Notes (Signed)
Randall Montoya - 70 y.o. male MRN [id number]  Date of birth: [birth year]  Office Visit Note: Visit Date: 05/11/2021 PCP: Mardi Mainland, FNP Referred by: Mardi Mainland,*  Subjective: Chief Complaint  Patient presents with   Lower Back - Pain   Left Leg - Pain   HPI:  Randall Montoya is a 70 y.o. male who comes in today at the request of Dr. Rodell Perna for planned Left S1-2 Lumbar Transforaminal epidural steroid injection with fluoroscopic guidance.  The patient has failed conservative care including home exercise, medications, time and activity modification.  This injection will be diagnostic and hopefully therapeutic.  Please see requesting physician notes for further details and justification.  Patient's been followed by Dr. Anderson Malta in Dr. Rodell Perna.  MRI shows lateral recess narrowing on the left side which does fit with his S1 type radicular symptoms down the back of the leg.  Patient's case is complicated by significant cardiovascular disease on chronic anticoagulation, Plavix.  ROS Otherwise per HPI.  Assessment & Plan: Visit Diagnoses:    ICD-10-CM   1. Lumbar radiculopathy  M54.16 XR C-ARM NO REPORT    Epidural Steroid injection    methylPREDNISolone acetate (DEPO-MEDROL) injection 80 mg      Plan: No additional findings.   Meds & Orders:  Meds ordered this encounter  Medications   methylPREDNISolone acetate (DEPO-MEDROL) injection 80 mg    Orders Placed This Encounter  Procedures   XR C-ARM NO REPORT   Epidural Steroid injection    Follow-up: Return for visit to requesting provider as needed.   Procedures: No procedures performed  S1 Lumbosacral Transforaminal Epidural Steroid Injection - Sub-Pedicular Approach with Fluoroscopic Guidance   Patient: Randall Montoya      Date of Birth: [birth year] MRN: [id number] PCP: Mardi Mainland, FNP      Visit Date: 05/11/2021   Universal Protocol:    Date/Time: 12/23/228:14 AM  Consent Given By:  the patient  Position:  PRONE  Additional Comments: Vital signs were monitored before and after the procedure. Patient was prepped and draped in the usual sterile fashion. The correct patient, procedure, and site was verified.   Injection Procedure Details:  Procedure Site One Meds Administered:  Meds ordered this encounter  Medications   methylPREDNISolone acetate (DEPO-MEDROL) injection 80 mg    Laterality: Left  Location/Site:  S1 Foramen   Needle size: 22 ga.  Needle type: Spinal  Needle Placement: Transforaminal  Findings:   -Comments: Excellent flow of contrast along the nerve, nerve root and into the epidural space.  Epidurogram: Contrast epidurogram showed no nerve root cut off or restricted flow pattern.  Procedure Details: After squaring off the sacral end-plate to get a true AP view, the C-arm was positioned so that the best possible view of the S1 foramen was visualized. The soft tissues overlying this structure were infiltrated with 2-3 ml. of 1% Lidocaine without Epinephrine.    The spinal needle was inserted toward the target using a "trajectory" view along the fluoroscope beam.  Under AP and lateral visualization, the needle was advanced so it did not puncture dura. Biplanar projections were used to confirm position. Aspiration was confirmed to be negative for CSF and/or blood. A 1-2 ml. volume of Isovue-250 was injected and flow of contrast was noted at each level. Radiographs were obtained for documentation purposes.   After attaining the desired flow of contrast documented above, a 0.5 to 1.0 ml test dose of 0.25% Marcaine was injected  into each respective transforaminal space.  The patient was observed for 90 seconds post injection.  After no sensory deficits were reported, and normal lower extremity motor function was noted,   the above injectate was administered so that equal amounts of the injectate were placed at each foramen (level) into the  transforaminal epidural space.   Additional Comments:  The patient tolerated the procedure well Dressing: Band-Aid with 2 x 2 sterile gauze    Post-procedure details: Patient was observed during the procedure. Post-procedure instructions were reviewed.  Patient left the clinic in stable condition.    Clinical History: MRI LUMBAR SPINE WITHOUT CONTRAST   TECHNIQUE: Multiplanar, multisequence MR imaging of the lumbar spine was performed. No intravenous contrast was administered.   COMPARISON:  Radiographs February 22, 2021.   FINDINGS: Segmentation:  Standard.   Alignment: Levoconvex scoliosis. Trace retrolisthesis of L2 over L3.   Vertebrae: No fracture, evidence of discitis, or bone lesion. Schmorl node in the superior endplate of L3. Endplate degenerative changes at L5-S1.   Conus medullaris and cauda equina: Conus extends to the L1 level. Conus and cauda equina appear normal.   Paraspinal and other soft tissues: Negative.   Disc levels:   T12-L1: No spinal canal or neural foraminal stenosis.   L1-2: No spinal canal or neural foraminal stenosis.   L2-3: Shallow disc bulge and mild facet degenerative changes resulting in mild bilateral neural foraminal narrowing. No spinal canal stenosis.   L3-4: Shallow disc bulge, mild facet degenerative changes and ligamentum flavum redundancy resulting in mild bilateral neural foraminal narrowing. No significant spinal canal stenosis.   L4-5: Disc bulge, mild facet degenerative changes ligamentum flavum redundancy resulting in mild bilateral neural foraminal narrowing. No significant spinal stenosis.   L5-S1: Loss of disc height, disc bulge with superimposed left subarticular/foraminal disc protrusion with associated osteophytic component and mild-to-moderate facet degenerative changes with ligamentum flavum redundancy resulting in narrowing of the left subarticular zone and severe left neural foraminal narrowing.    IMPRESSION: 1. Degenerative changes at L5-S1 with narrowing of the left subarticular zone and severe left neural foraminal narrowing, likely impinging on the left L5 and S1 nerve roots. 2. Mild degenerative changes in the remainder of the lumbar spine without high-grade spinal canal or neural foraminal stenosis.     Electronically Signed   By: Randall Montoya M.D.   On: 03/18/2021 17:14     Objective:  VS:  HT:     WT:    BMI:      BP:(!) 159/80   HR:71bpm   TEMP: ( )   RESP:  Physical Exam Vitals and nursing note reviewed.  Constitutional:      General: He is not in acute distress.    Appearance: Normal appearance. He is not ill-appearing.  HENT:     Head: Normocephalic and atraumatic.     Right Ear: External ear normal.     Left Ear: External ear normal.     Nose: No congestion.  Eyes:     Extraocular Movements: Extraocular movements intact.  Cardiovascular:     Rate and Rhythm: Normal rate.     Pulses: Normal pulses.  Pulmonary:     Effort: Pulmonary effort is normal. No respiratory distress.  Abdominal:     General: There is no distension.     Palpations: Abdomen is soft.  Musculoskeletal:        General: No tenderness or signs of injury.     Cervical back: Neck supple.  Right lower leg: No edema.     Left lower leg: No edema.     Comments: Patient has good distal strength without clonus.  Wearing bilateral knee braces.  Ambulates with a walker.  Skin:    Findings: No erythema or rash.  Neurological:     General: No focal deficit present.     Mental Status: He is alert and oriented to person, place, and time.     Sensory: No sensory deficit.     Motor: No weakness or abnormal muscle tone.     Coordination: Coordination normal.     Gait: Gait abnormal.  Psychiatric:        Mood and Affect: Mood normal.        Behavior: Behavior normal.     Imaging: XR C-ARM NO REPORT  Result Date: 05/11/2021 Please see Notes tab for imaging impression.

## 2021-05-12 NOTE — Procedures (Signed)
S1 Lumbosacral Transforaminal Epidural Steroid Injection - Sub-Pedicular Approach with Fluoroscopic Guidance   Patient: Randall Montoya      Date of Birth: 02/11/1951 MRN: [id number] PCP: Mardi Mainland, FNP      Visit Date: 05/11/2021   Universal Protocol:    Date/Time: 12/23/228:14 AM  Consent Given By: the patient  Position:  PRONE  Additional Comments: Vital signs were monitored before and after the procedure. Patient was prepped and draped in the usual sterile fashion. The correct patient, procedure, and site was verified.   Injection Procedure Details:  Procedure Site One Meds Administered:  Meds ordered this encounter  Medications   methylPREDNISolone acetate (DEPO-MEDROL) injection 80 mg    Laterality: Left  Location/Site:  S1 Foramen   Needle size: 22 ga.  Needle type: Spinal  Needle Placement: Transforaminal  Findings:   -Comments: Excellent flow of contrast along the nerve, nerve root and into the epidural space.  Epidurogram: Contrast epidurogram showed no nerve root cut off or restricted flow pattern.  Procedure Details: After squaring off the sacral end-plate to get a true AP view, the C-arm was positioned so that the best possible view of the S1 foramen was visualized. The soft tissues overlying this structure were infiltrated with 2-3 ml. of 1% Lidocaine without Epinephrine.    The spinal needle was inserted toward the target using a "trajectory" view along the fluoroscope beam.  Under AP and lateral visualization, the needle was advanced so it did not puncture dura. Biplanar projections were used to confirm position. Aspiration was confirmed to be negative for CSF and/or blood. A 1-2 ml. volume of Isovue-250 was injected and flow of contrast was noted at each level. Radiographs were obtained for documentation purposes.   After attaining the desired flow of contrast documented above, a 0.5 to 1.0 ml test dose of 0.25% Marcaine was injected into  each respective transforaminal space.  The patient was observed for 90 seconds post injection.  After no sensory deficits were reported, and normal lower extremity motor function was noted,   the above injectate was administered so that equal amounts of the injectate were placed at each foramen (level) into the transforaminal epidural space.   Additional Comments:  The patient tolerated the procedure well Dressing: Band-Aid with 2 x 2 sterile gauze    Post-procedure details: Patient was observed during the procedure. Post-procedure instructions were reviewed.  Patient left the clinic in stable condition.

## 2021-05-23 ENCOUNTER — Other Ambulatory Visit: Payer: Self-pay

## 2021-05-23 ENCOUNTER — Ambulatory Visit
Admission: RE | Admit: 2021-05-23 | Discharge: 2021-05-23 | Disposition: A | Discharge status: Home / Self Care | Payer: Medicare Other | Source: Ambulatory Visit | Attending: Orthopaedic Surgery | Admitting: Orthopaedic Surgery

## 2021-05-23 DIAGNOSIS — M542 Cervicalgia: Secondary | ICD-10-CM

## 2021-05-23 IMAGING — MR MR CERVICAL SPINE W/O CM
6 series · 41 of 48 positions shown · non-contrast
Comparison: Radiographs [DATE].

CLINICAL DATA: Spondylosis, C6-7.  Neck pain.

EXAM:
MRI CERVICAL SPINE WITHOUT CONTRAST
TECHNIQUE: Multiplanar, multisequence MR imaging of the cervical spine was
performed. No intravenous contrast was administered.

[Series 5: T2 · sagittal · 3.0mm · 0.55mm/px · 5 of 19 slices shown (1 of 3)]
[im 1/19]
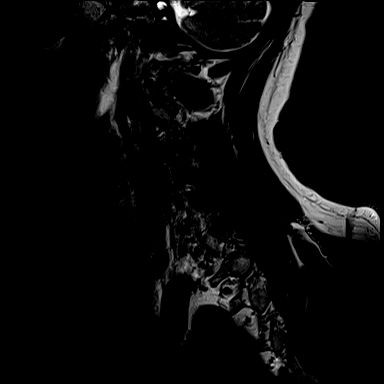
[im 5/19]
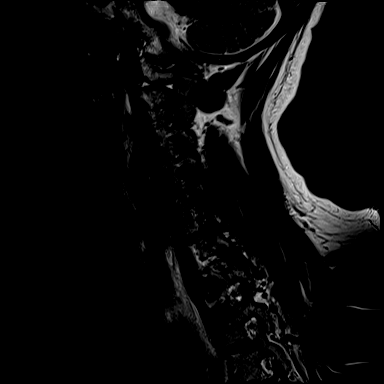
[im 10/19]
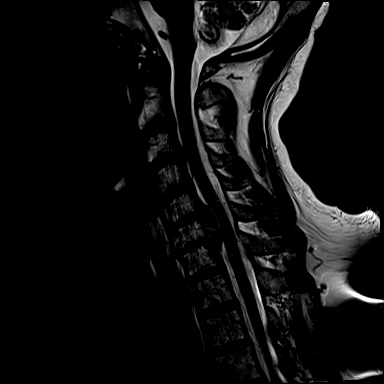
[im 14/19]
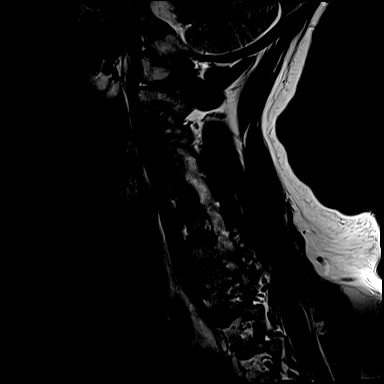
[im 19/19]
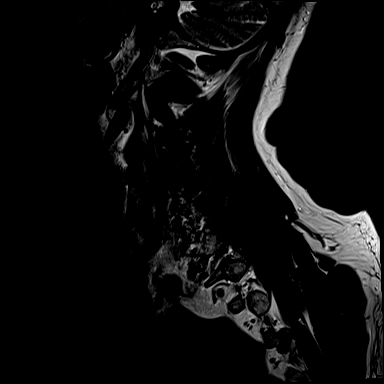

[Series 6: T1 · sagittal · 3.0mm · 0.82mm/px · 5 of 19 slices shown]
[im 1/19]
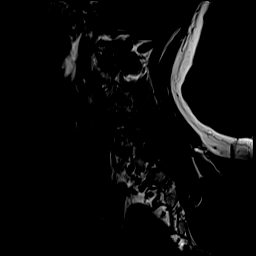
[im 5/19]
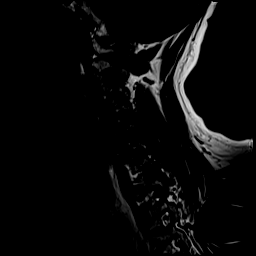
[im 10/19]
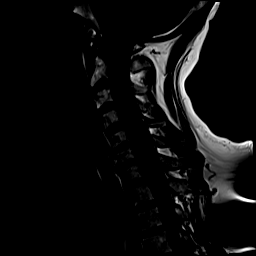
[im 14/19]
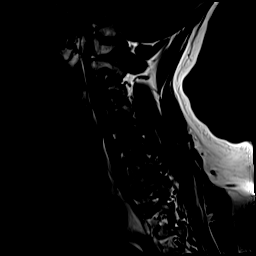
[im 19/19]
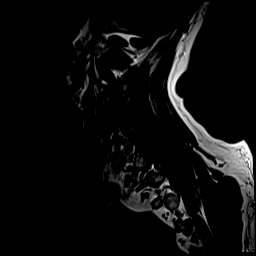

[Series 7: STIR · sagittal · 3.0mm · 0.41mm/px · 5 of 19 slices shown]
[im 1/19]
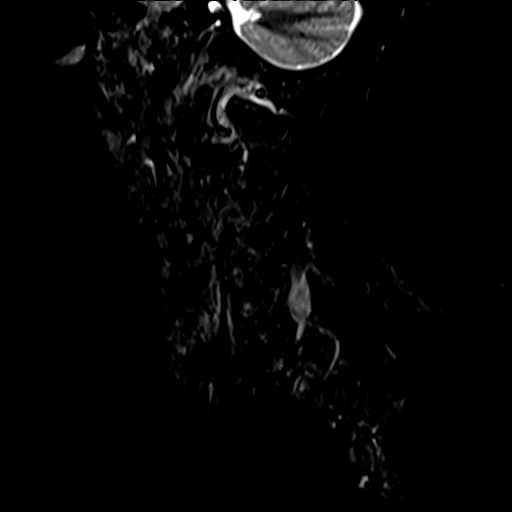
[im 5/19]
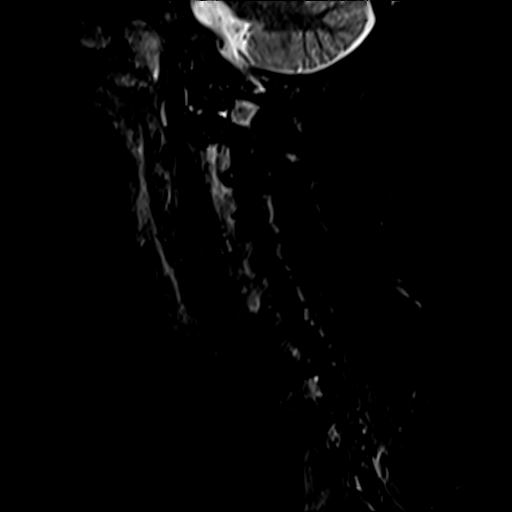
[im 10/19]
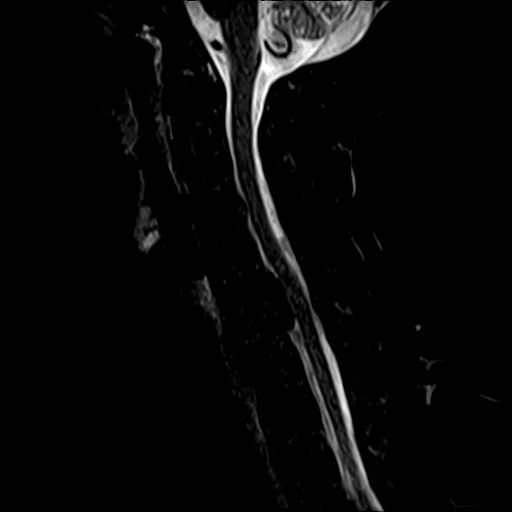
[im 14/19]
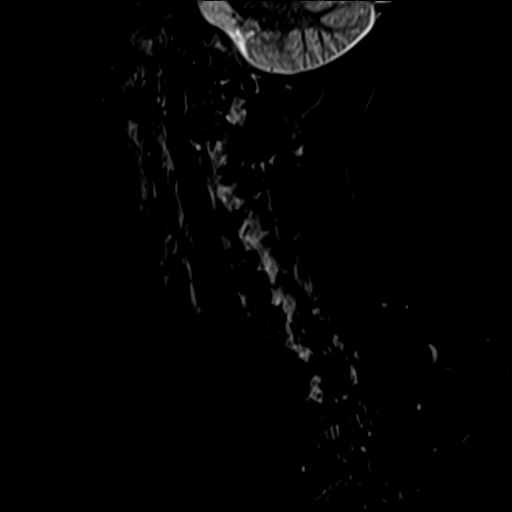
[im 19/19]
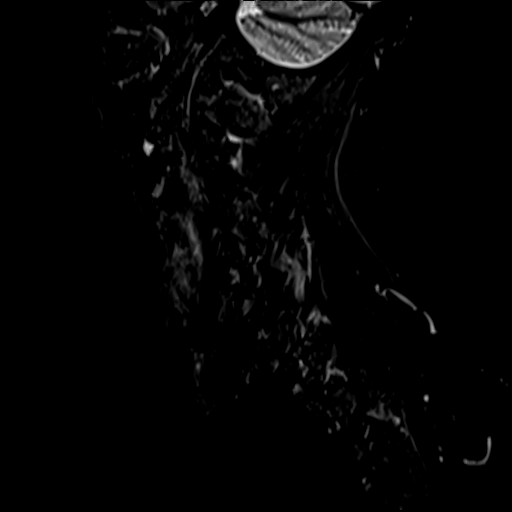

[Series 8: T2 · axial · 3.0mm · 0.70mm/px · z∈[-59,+61]mm · 11 of 39 slices shown (2 of 3)]
[im 1/39]
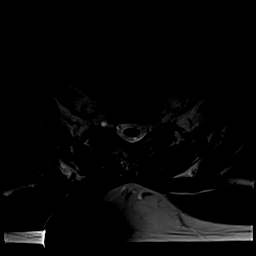
[im 4/39]
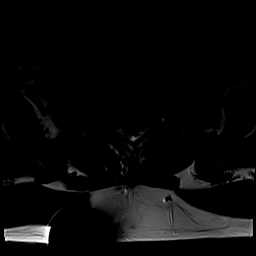
[im 8/39]
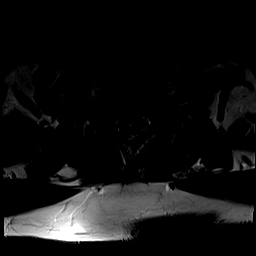
[im 12/39]
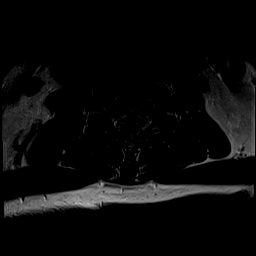
[im 16/39]
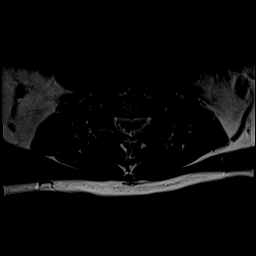
[im 20/39]
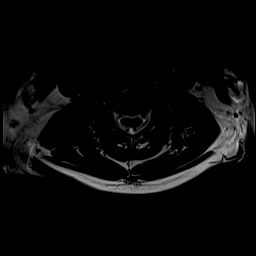
[im 23/39]
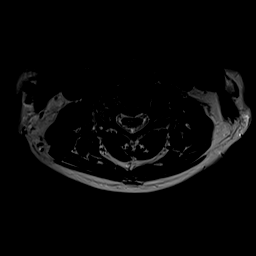
[im 27/39]
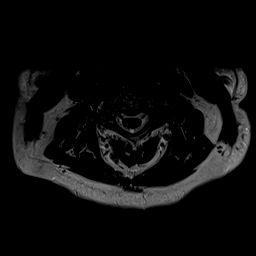
[im 31/39]
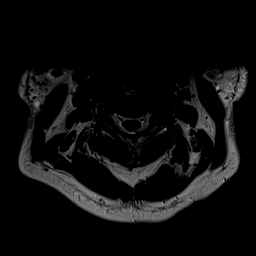
[im 35/39]
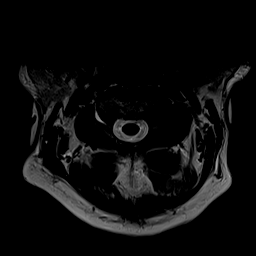
[im 39/39]
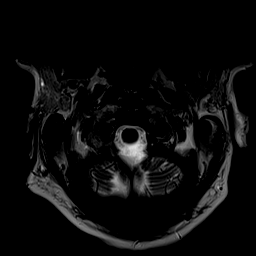

[Series 9: GRE · axial · 3.0mm · 0.47mm/px · z∈[-59,-11]mm · 4 of 39 slices shown]
[im 1/39]
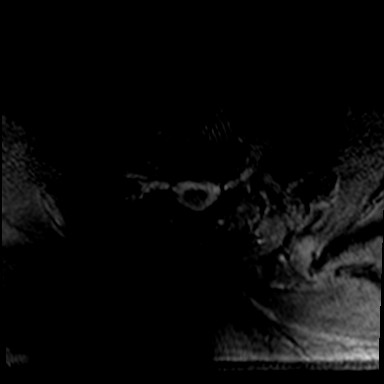
[im 8/39]
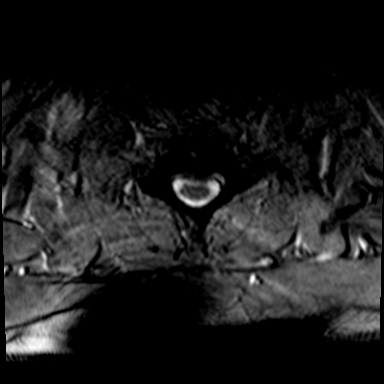
[im 12/39]
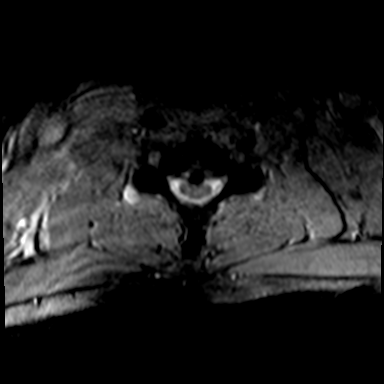
[im 16/39]
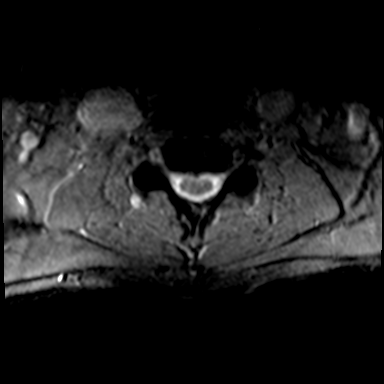

[Series 10: T2 · axial · 3.0mm · 0.70mm/px · z∈[-59,+61]mm · 11 of 39 slices shown (3 of 3)]
[im 1/39]
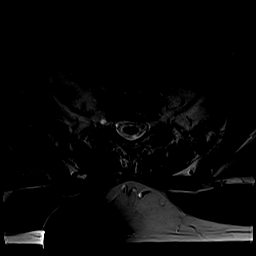
[im 4/39]
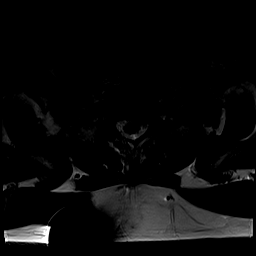
[im 8/39]
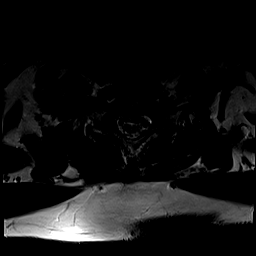
[im 12/39]
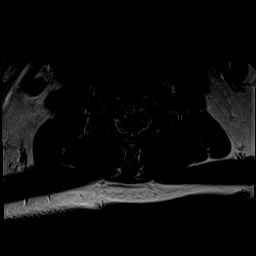
[im 16/39]
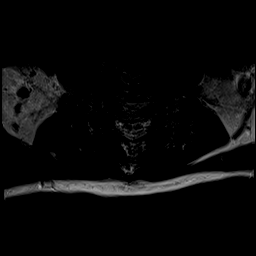
[im 20/39]
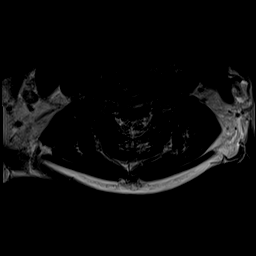
[im 23/39]
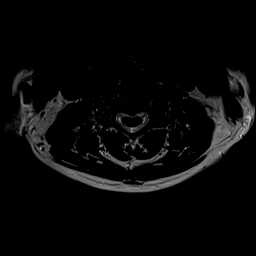
[im 27/39]
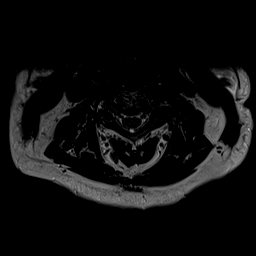
[im 31/39]
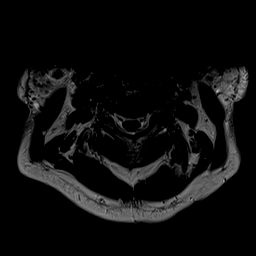
[im 35/39]
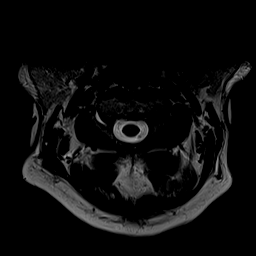
[im 39/39]
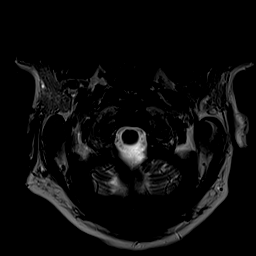

[41 of 48 positions shown; findings below may reference images not displayed]

FINDINGS: The study is partially degraded by motion.

Alignment: Straightening of the cervical curvature.

Vertebrae: No fracture, evidence of discitis, or bone lesion.

Cord: Mass effect on the cord at C6-7 without definitive cord signal
abnormality.

Posterior Fossa, vertebral arteries, paraspinal tissues: Negative.

Disc levels:

C1-2: Right-sided atlantoaxial joint effusion.

C2-3: Facet degenerative changes resulting in mild left neural
foraminal narrowing. No spinal canal stenosis.

C3-4: Posterior disc osteophyte complex causing indentation of the
thecal sac and contacting the anterior cord surface without
significant spinal canal stenosis. Facet degenerative changes
without significant neural foraminal narrowing.

C4-5: Mild facet degenerative changes. No significant spinal canal
or neural foraminal stenosis.

C5-6: Posterior disc osteophyte complex with ossification of the
posterior longitudinal ligament abutting the anterior cord surface
resulting in mild spinal canal stenosis. Uncovertebral and facet
degenerative changes resulting in mild bilateral neural foraminal
narrowing.

C6-7: Posterior disc osteophyte complex with ossification of the
posterior longitudinal ligament resulting in mild mass effect on the
cord and mild to moderate spinal canal stenosis. Uncovertebral and
facet degenerative changes resulting in severe right and moderate
left neural foraminal narrowing.

C7-T1: Small disc bulge and mild facet degenerative changes
resulting in mild bilateral neural foraminal narrowing. No
significant spinal canal stenosis.
IMPRESSION: 1. Degenerative changes of the cervical spine, more pronounced at
C5-6 and C6-7 where there is ossification of the posterior
longitudinal ligament.
2. Mild to moderate spinal canal stenosis at C6-7 and mild at C5-6.
3. Severe right and moderate left neural foraminal narrowing at
C6-7.

## 2021-05-30 ENCOUNTER — Ambulatory Visit (INDEPENDENT_AMBULATORY_CARE_PROVIDER_SITE_OTHER): Payer: Commercial Managed Care - HMO | Admitting: Orthopaedic Surgery

## 2021-05-30 ENCOUNTER — Encounter: Payer: Self-pay | Admitting: Orthopaedic Surgery

## 2021-05-30 ENCOUNTER — Ambulatory Visit: Payer: Commercial Managed Care - HMO

## 2021-05-30 ENCOUNTER — Other Ambulatory Visit: Payer: Self-pay

## 2021-05-30 VITALS — BP 134/71 | HR 73 | Ht 68.0 in | Wt 229.0 lb

## 2021-05-30 DIAGNOSIS — M542 Cervicalgia: Secondary | ICD-10-CM

## 2021-05-30 DIAGNOSIS — M47812 Spondylosis without myelopathy or radiculopathy, cervical region: Secondary | ICD-10-CM | POA: Diagnosis not present
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
## 2021-06-02 ENCOUNTER — Telehealth: Payer: Self-pay

## 2021-06-02 NOTE — Telephone Encounter (Signed)
FYI Pt called and wanted to know what to do with the chair lift rx he got. I advised pt. He needs to take it to a medical supply store. Pt stated understanding

## 2021-06-02 NOTE — Telephone Encounter (Signed)
noted 

## 2021-06-16 ENCOUNTER — Inpatient Hospital Stay (HOSPITAL_COMMUNITY)
Admission: EM | Admit: 2021-06-16 | Discharge: 2021-06-20 | DRG: 291 | Disposition: A | Discharge status: Home / Self Care | Payer: Medicare Other | Attending: Internal Medicine | Admitting: Internal Medicine

## 2021-06-16 ENCOUNTER — Emergency Department (HOSPITAL_COMMUNITY): Payer: Medicare Other

## 2021-06-16 ENCOUNTER — Other Ambulatory Visit: Payer: Self-pay

## 2021-06-16 ENCOUNTER — Encounter (HOSPITAL_COMMUNITY): Payer: Self-pay | Admitting: Emergency Medicine

## 2021-06-16 DIAGNOSIS — T380X5A Adverse effect of glucocorticoids and synthetic analogues, initial encounter: Secondary | ICD-10-CM | POA: Diagnosis present

## 2021-06-16 DIAGNOSIS — E785 Hyperlipidemia, unspecified: Secondary | ICD-10-CM | POA: Diagnosis present

## 2021-06-16 DIAGNOSIS — Z7982 Long term (current) use of aspirin: Secondary | ICD-10-CM

## 2021-06-16 DIAGNOSIS — Z79899 Other long term (current) drug therapy: Secondary | ICD-10-CM

## 2021-06-16 DIAGNOSIS — R0902 Hypoxemia: Secondary | ICD-10-CM | POA: Diagnosis not present

## 2021-06-16 DIAGNOSIS — Z87891 Personal history of nicotine dependence: Secondary | ICD-10-CM

## 2021-06-16 DIAGNOSIS — R2241 Localized swelling, mass and lump, right lower limb: Secondary | ICD-10-CM

## 2021-06-16 DIAGNOSIS — Z7902 Long term (current) use of antithrombotics/antiplatelets: Secondary | ICD-10-CM

## 2021-06-16 DIAGNOSIS — M109 Gout, unspecified: Secondary | ICD-10-CM | POA: Diagnosis present

## 2021-06-16 DIAGNOSIS — I252 Old myocardial infarction: Secondary | ICD-10-CM

## 2021-06-16 DIAGNOSIS — G4733 Obstructive sleep apnea (adult) (pediatric): Secondary | ICD-10-CM

## 2021-06-16 DIAGNOSIS — R739 Hyperglycemia, unspecified: Secondary | ICD-10-CM | POA: Diagnosis present

## 2021-06-16 DIAGNOSIS — Z955 Presence of coronary angioplasty implant and graft: Secondary | ICD-10-CM

## 2021-06-16 DIAGNOSIS — I509 Heart failure, unspecified: Secondary | ICD-10-CM

## 2021-06-16 DIAGNOSIS — I2510 Atherosclerotic heart disease of native coronary artery without angina pectoris: Secondary | ICD-10-CM | POA: Diagnosis present

## 2021-06-16 DIAGNOSIS — R7303 Prediabetes: Secondary | ICD-10-CM | POA: Diagnosis present

## 2021-06-16 DIAGNOSIS — I10 Essential (primary) hypertension: Secondary | ICD-10-CM | POA: Diagnosis present

## 2021-06-16 DIAGNOSIS — I110 Hypertensive heart disease with heart failure: Secondary | ICD-10-CM | POA: Diagnosis not present

## 2021-06-16 DIAGNOSIS — I5033 Acute on chronic diastolic (congestive) heart failure: Secondary | ICD-10-CM | POA: Diagnosis present

## 2021-06-16 DIAGNOSIS — Z20822 Contact with and (suspected) exposure to covid-19: Secondary | ICD-10-CM | POA: Diagnosis present

## 2021-06-16 DIAGNOSIS — J441 Chronic obstructive pulmonary disease with (acute) exacerbation: Secondary | ICD-10-CM | POA: Diagnosis present

## 2021-06-16 DIAGNOSIS — I5032 Chronic diastolic (congestive) heart failure: Secondary | ICD-10-CM | POA: Diagnosis present

## 2021-06-16 DIAGNOSIS — Z7951 Long term (current) use of inhaled steroids: Secondary | ICD-10-CM

## 2021-06-16 LAB — CBC WITH DIFFERENTIAL/PLATELET
Abs Immature Granulocytes: 0.04 10*3/uL (ref 0.00–0.07)
Basophils Absolute: 0 10*3/uL (ref 0.0–0.1)
Basophils Relative: 1 %
Eosinophils Absolute: 0.1 10*3/uL (ref 0.0–0.5)
Eosinophils Relative: 2 %
HCT: 46.6 % (ref 39.0–52.0)
Hemoglobin: 15.4 g/dL (ref 13.0–17.0)
Immature Granulocytes: 1 %
Lymphocytes Relative: 31 %
Lymphs Abs: 2 10*3/uL (ref 0.7–4.0)
MCH: 29.3 pg (ref 26.0–34.0)
MCHC: 33 g/dL (ref 30.0–36.0)
MCV: 88.8 fL (ref 80.0–100.0)
Monocytes Absolute: 0.7 10*3/uL (ref 0.1–1.0)
Monocytes Relative: 11 %
Neutro Abs: 3.5 10*3/uL (ref 1.7–7.7)
Neutrophils Relative %: 54 %
Platelets: 233 10*3/uL (ref 150–400)
RBC: 5.25 MIL/uL (ref 4.22–5.81)
RDW: 14.6 % (ref 11.5–15.5)
WBC: 6.5 10*3/uL (ref 4.0–10.5)
nRBC: 0 % (ref 0.0–0.2)

## 2021-06-16 LAB — TROPONIN I (HIGH SENSITIVITY)
Troponin I (High Sensitivity): 5 ng/L (ref <18)
Troponin I (High Sensitivity): 4 ng/L (ref <18)

## 2021-06-16 LAB — COMPREHENSIVE METABOLIC PANEL
ALT: 28 U/L (ref 0–44)
AST: 19 U/L (ref 15–41)
Albumin: 4.4 g/dL (ref 3.5–5.0)
Alkaline Phosphatase: 95 U/L (ref 38–126)
Anion gap: 10 (ref 5–15)
BUN: 30 mg/dL — ABNORMAL HIGH (ref 8–23)
CO2: 23 mmol/L (ref 22–32)
Calcium: 9 mg/dL (ref 8.9–10.3)
Chloride: 105 mmol/L (ref 98–111)
Creatinine, Ser: 1.19 mg/dL (ref 0.61–1.24)
GFR, Estimated: >60 mL/min (ref >60)
Glucose, Bld: 107 mg/dL — ABNORMAL HIGH (ref 70–99)
Potassium: 3.6 mmol/L (ref 3.5–5.1)
Sodium: 138 mmol/L (ref 135–145)
Total Bilirubin: 0.7 mg/dL (ref 0.3–1.2)
Total Protein: 7.2 g/dL (ref 6.5–8.1)

## 2021-06-16 LAB — LIPASE, BLOOD: Lipase: 43 U/L (ref 11–51)

## 2021-06-16 LAB — RESP PANEL BY RT-PCR (FLU A&B, COVID) ARPGX2
Influenza A by PCR: NEGATIVE
Influenza B by PCR: NEGATIVE
SARS Coronavirus 2 by RT PCR: NEGATIVE

## 2021-06-16 LAB — BRAIN NATRIURETIC PEPTIDE: B Natriuretic Peptide: 65.8 pg/mL (ref 0.0–100.0)

## 2021-06-16 LAB — MAGNESIUM: Magnesium: 2.1 mg/dL (ref 1.7–2.4)

## 2021-06-16 IMAGING — CR DG CHEST 2V
2 series · 2 of 2 positions shown · non-contrast
Comparison: [DATE]

CLINICAL DATA: Shortness of breath, weight gain, lower extremity
swelling

EXAM:
CHEST - 2 VIEW

[w chest lat]
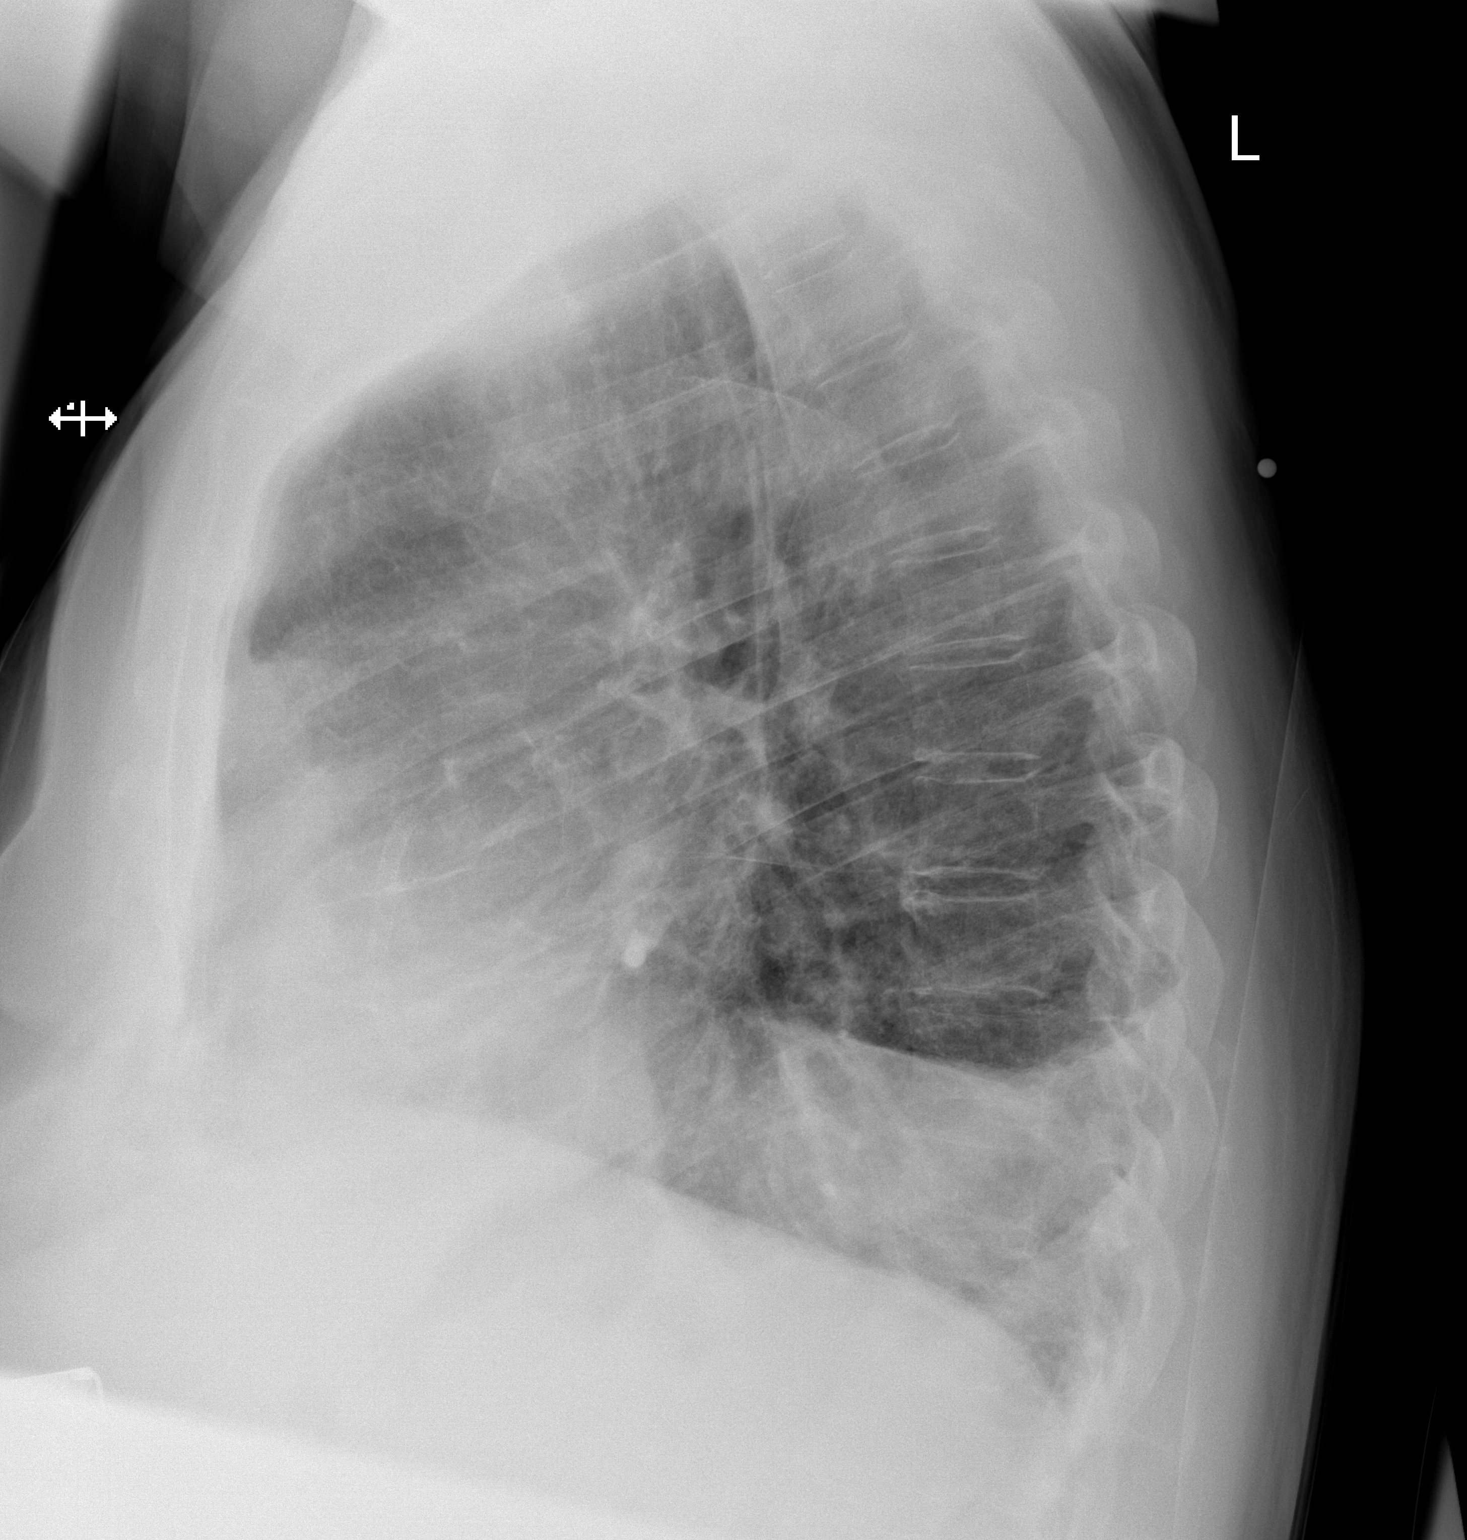

[x chest ap]
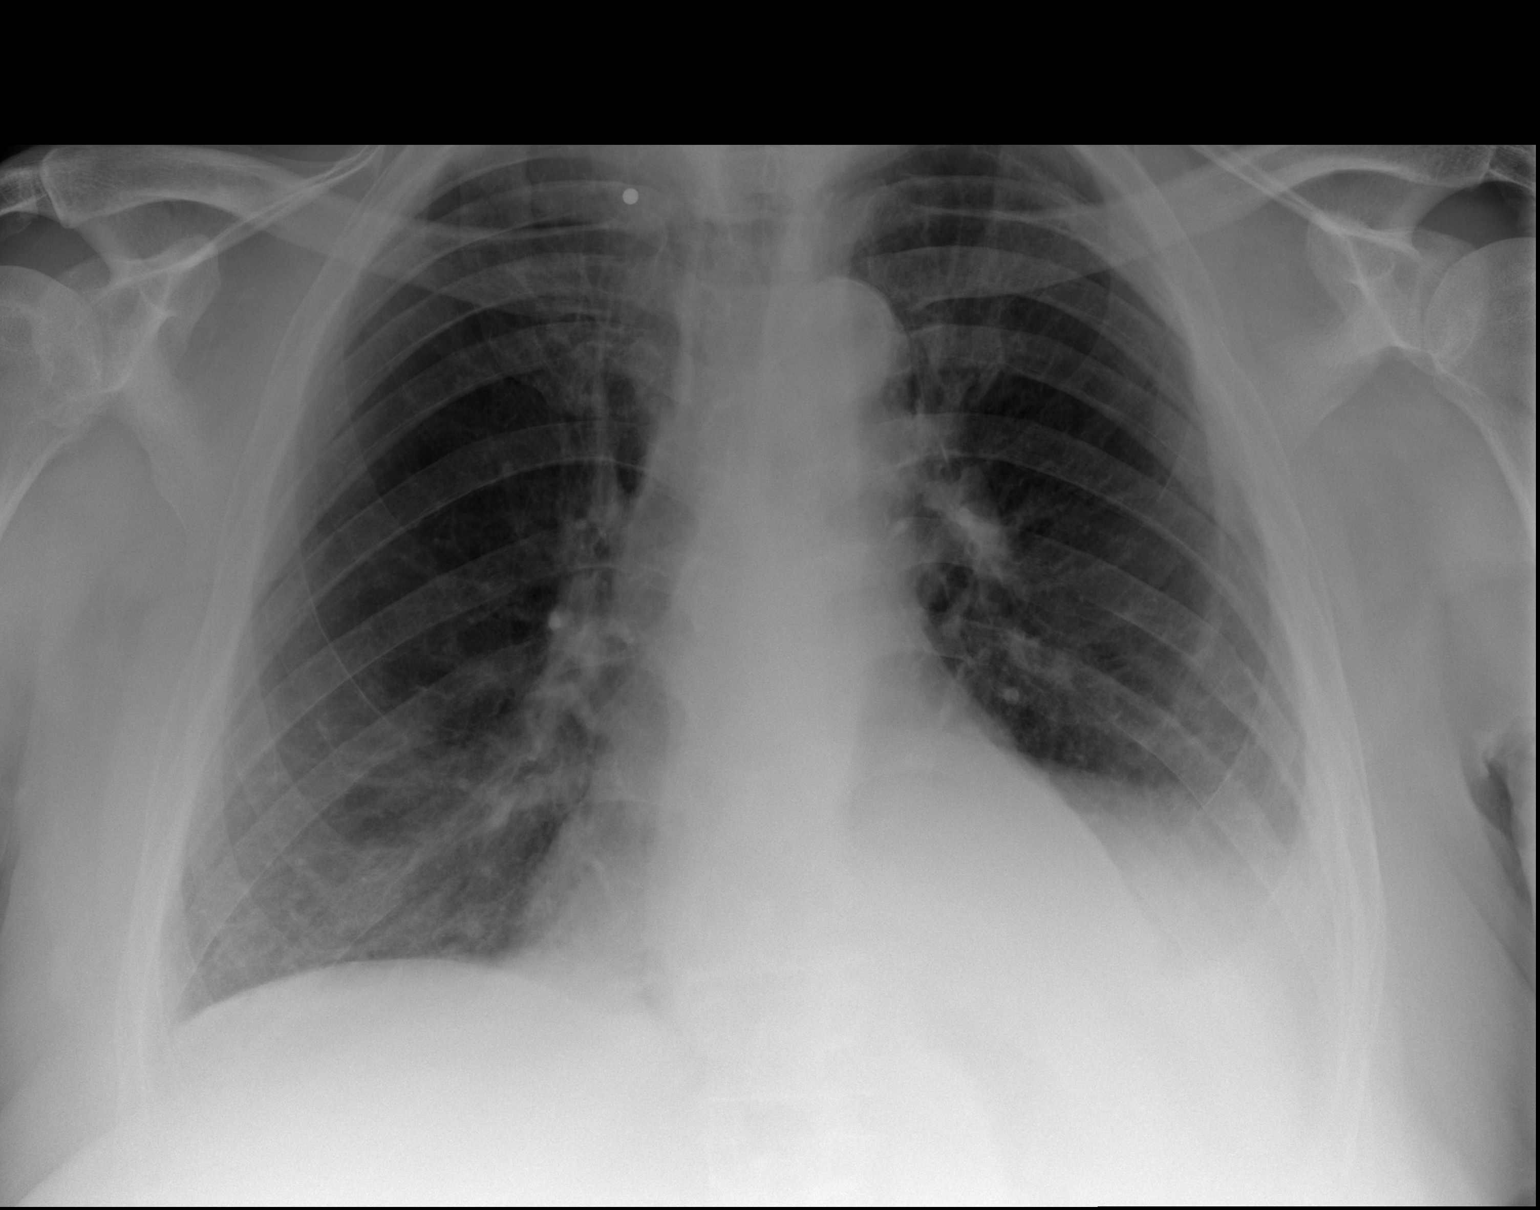

[2 of 2 positions shown; findings below may reference images not displayed]

FINDINGS: Frontal and lateral views of the chest demonstrate a stable cardiac
silhouette. Increased veiling opacity at the left lung base
consistent with enlarging left pleural effusion and increasing left
lower lobe consolidation. Right chest is clear. No pneumothorax. No
acute bony abnormalities. Prior resorption or resection of the left
posterior fourth rib.
IMPRESSION: 1. Progressive left basilar consolidation and effusion.

## 2021-06-16 IMAGING — CT CT ABD-PELV W/ CM
2 of 5 series · 16 of 46 positions shown, 18 images · IV contrast (agent unspecified)
Comparison: None.

CLINICAL DATA: 30 pound weight gain over 4 days, lower abdominal
and leg swelling, abdominal pain

EXAM:
CT ABDOMEN AND PELVIS WITH CONTRAST
TECHNIQUE: Multidetector CT imaging of the abdomen and pelvis was performed
using the standard protocol following bolus administration of
intravenous contrast.

[Series 2: axial st · axial · 0.98mm/px · z∈[-544,-129]mm · 13 of 97 slices shown, 15 images]
[im 7/97  soft-tissue]
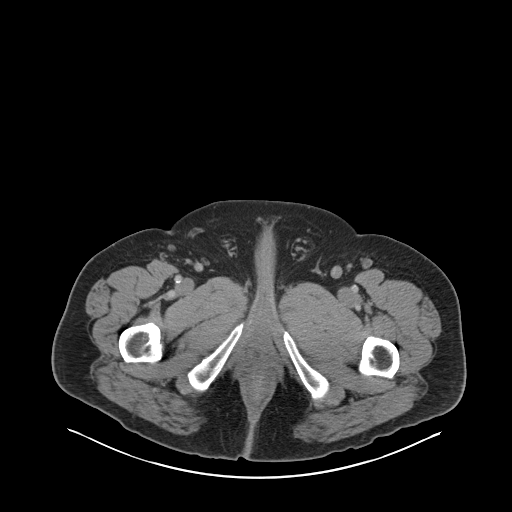
[im 7/97  bone]
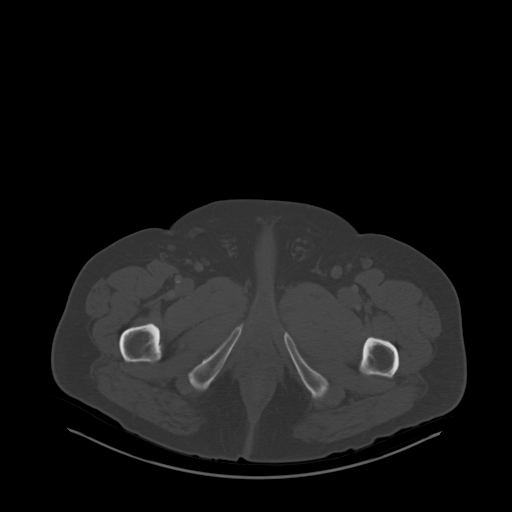
[im 13/97  soft-tissue]
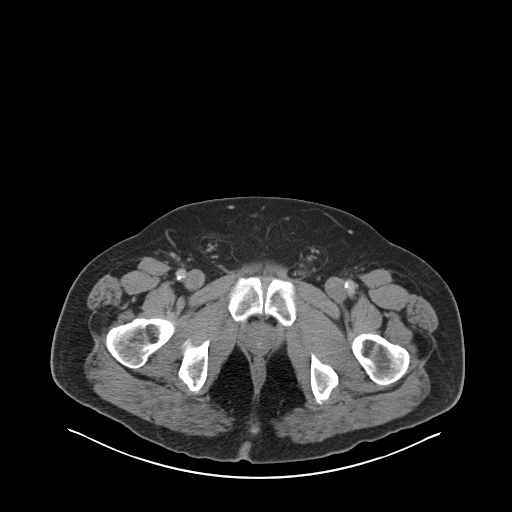
[im 20/97  soft-tissue]
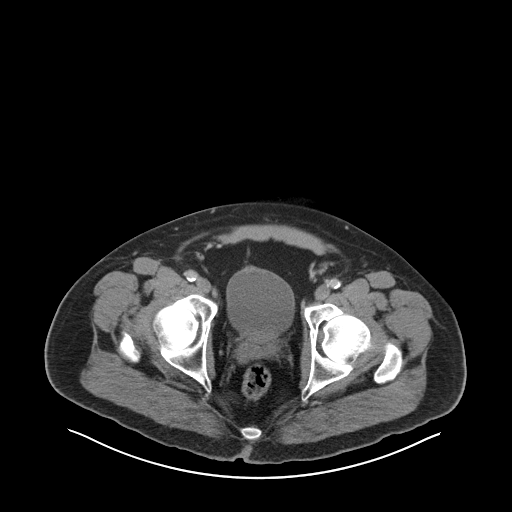
[im 26/97  soft-tissue]
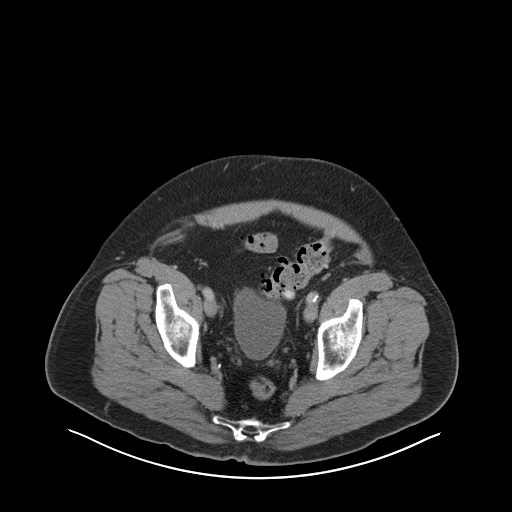
[im 33/97  soft-tissue]
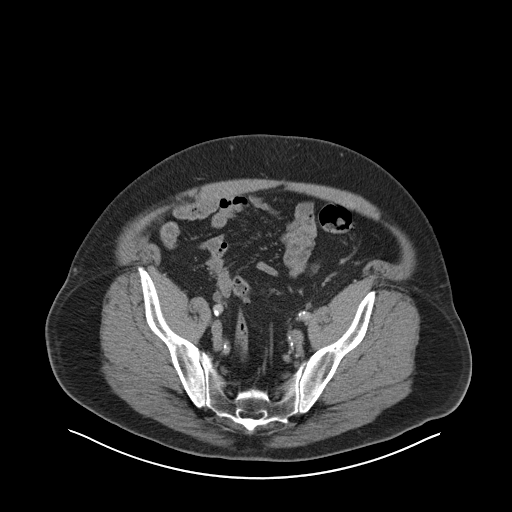
[im 39/97  soft-tissue]
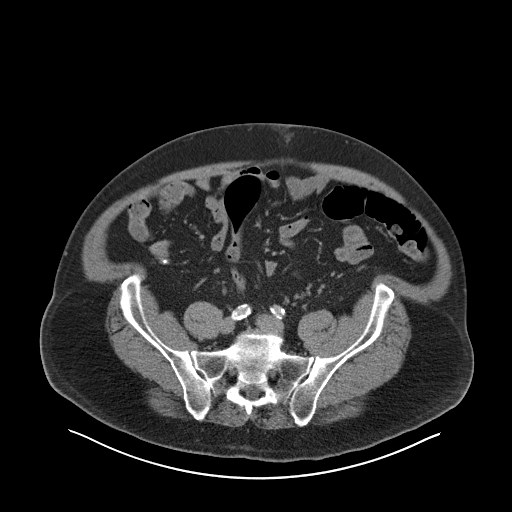
[im 52/97  soft-tissue]
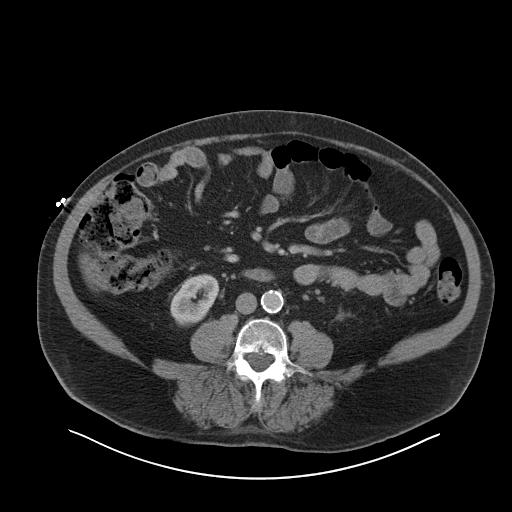
[im 58/97  soft-tissue]
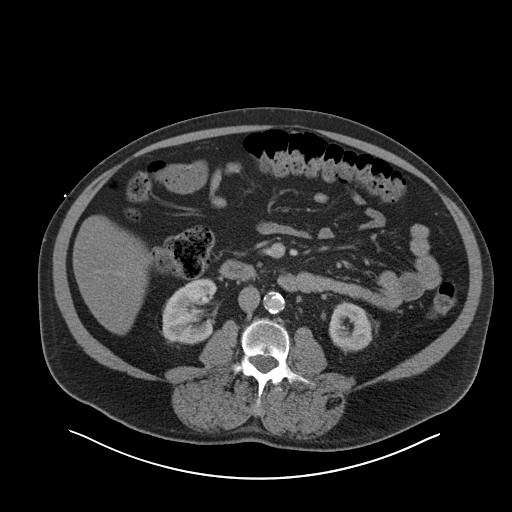
[im 65/97  soft-tissue]
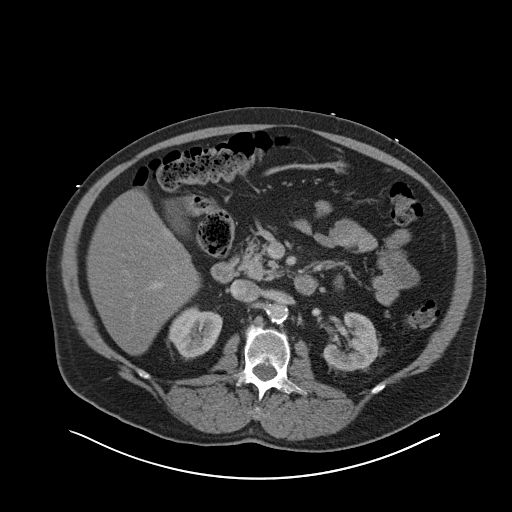
[im 65/97  bone]
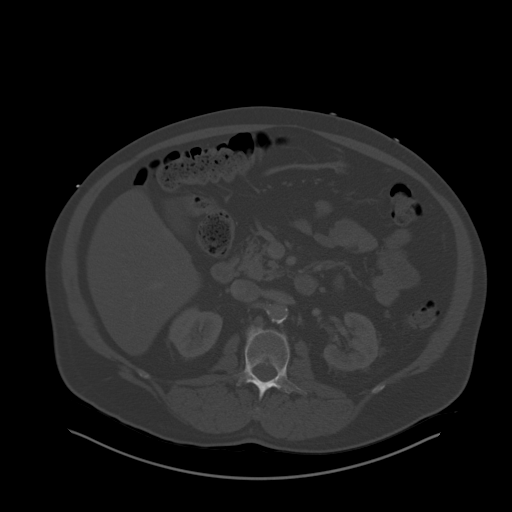
[im 71/97  soft-tissue]
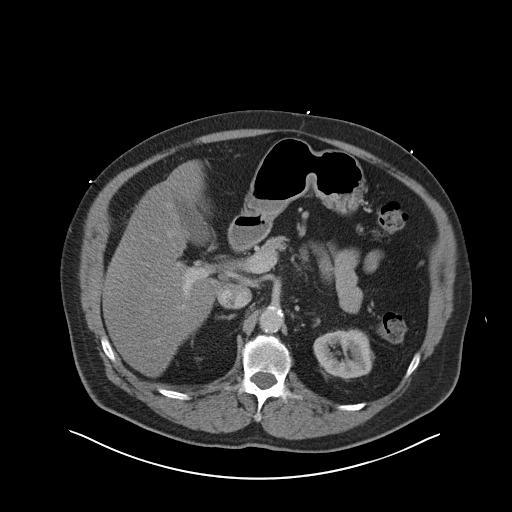
[im 77/97  soft-tissue]
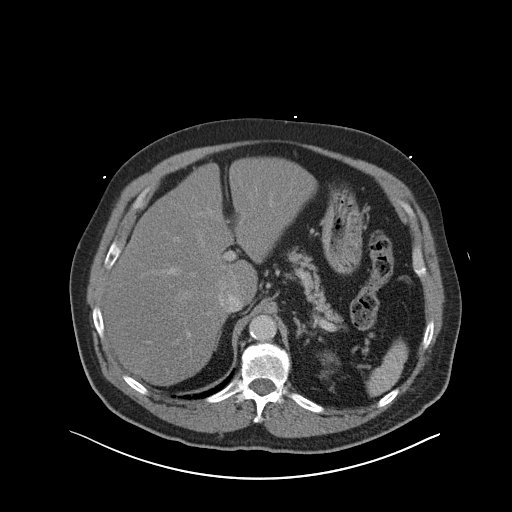
[im 84/97  soft-tissue]
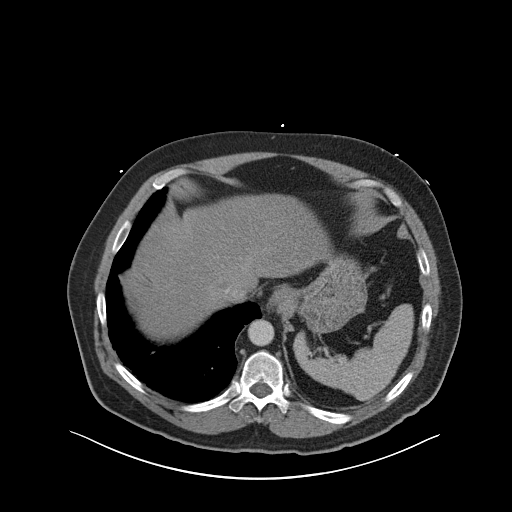
[im 90/97  soft-tissue]
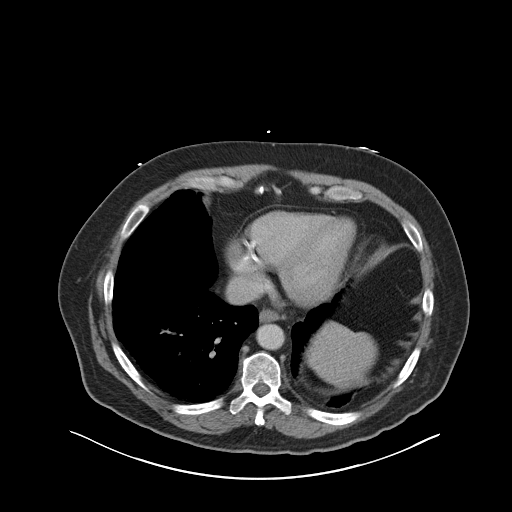

[Series 5: coronal st · coronal · 0.91mm/px · 3 of 185 slices shown]
[im 62/185  soft-tissue]
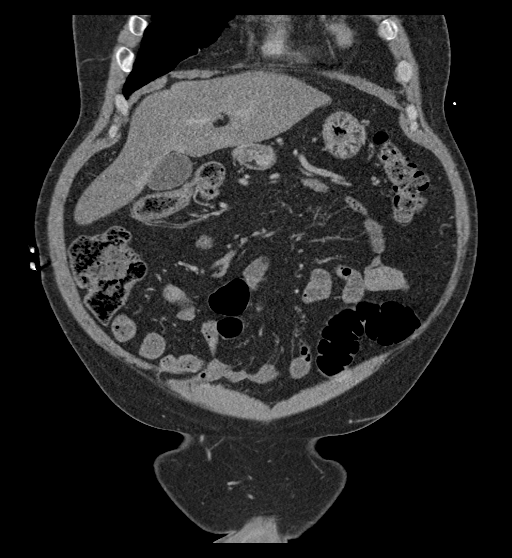
[im 82/185  soft-tissue]
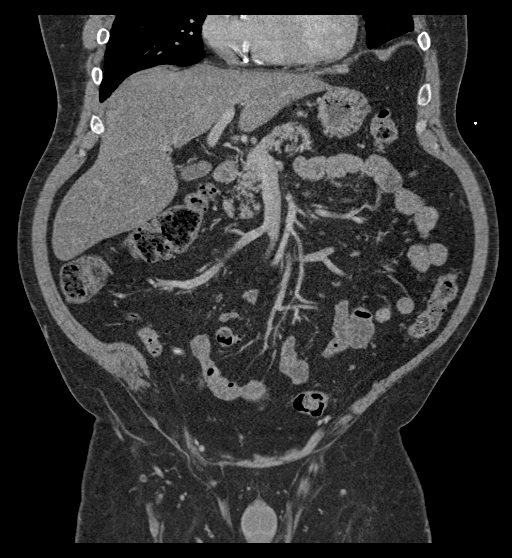
[im 103/185  soft-tissue]
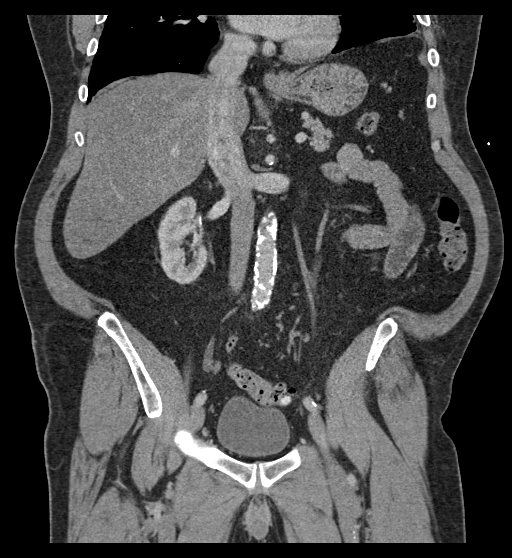

[16 of 46 positions shown; findings below may reference images not displayed]

RADIATION DOSE REDUCTION: This exam was performed according to the
departmental dose-optimization program which includes automated
exposure control, adjustment of the mA and/or kV according to
patient size and/or use of iterative reconstruction technique.

CONTRAST:  100mL OMNIPAQUE IOHEXOL 300 MG/ML  SOLN
FINDINGS: Lower chest: No acute pleural or parenchymal lung disease. Mild
bibasilar subpleural scarring.

Hepatobiliary: Diffuse hepatic steatosis. No focal liver
abnormality. Gallbladder is unremarkable.

Pancreas: Unremarkable. No pancreatic ductal dilatation or
surrounding inflammatory changes.

Spleen: Normal in size without focal abnormality.

Adrenals/Urinary Tract: Kidneys enhance normally and symmetrically.
No urinary tract calculi or obstruction. The adrenals and bladder
are unremarkable.

Stomach/Bowel: No bowel obstruction or ileus. Minimal diverticulosis
of the descending and sigmoid colon. No evidence of acute
diverticulitis. Normal appendix right lower quadrant. No bowel wall
thickening or inflammatory change.

Vascular/Lymphatic: Aortic atherosclerosis. No enlarged abdominal or
pelvic lymph nodes.

Reproductive: Prostate is unremarkable.

Other: No free fluid or free gas. Fat containing umbilical hernia.
No bowel herniation.

Musculoskeletal: No acute or destructive bony lesions. Reconstructed
images demonstrate no additional findings.
IMPRESSION: 1. Distal colonic diverticulosis without diverticulitis.
2. Fat containing umbilical hernia.
3. Hepatic steatosis.
4.  Aortic Atherosclerosis ([QK]-[QK]).

## 2021-06-16 MED ORDER — FUROSEMIDE 10 MG/ML IJ SOLN
80.0000 mg | Freq: Once | INTRAMUSCULAR | Status: AC
  Administered 2021-06-16: 80 mg via INTRAVENOUS
  Filled 2021-06-16: qty 8

## 2021-06-16 MED ORDER — IOHEXOL 300 MG/ML  SOLN
100.0000 mL | Freq: Once | INTRAMUSCULAR | Status: AC | PRN
  Administered 2021-06-16: 100 mL via INTRAVENOUS

## 2021-06-16 NOTE — ED Provider Notes (Signed)
[facility] DEPT Provider Note   CSN: [id number] Arrival date & time: 06/16/21  1713     History  Chief Complaint  Patient presents with   Shortness of Breath   Leg Swelling    Randall Montoya is a 71 y.o. male. PMH includes CHF, COPD, HTN, and HLD. Patient presents to the ED with 4 days of worsening bilateral LE swelling, abdominal distension, and shortness of breath. He also reports a 30 lb weight gain over the past week. He was recently discharged in mid December for CHF exasperation. He states that he takes 80 mg of Lasix in the morning and 40 mg of Lasix at night and has been compliant. He states that he is not urinating as much as usual. He also reports abdominal pain. Normal bowel movements and no n/v/d.    Shortness of Breath Associated symptoms: abdominal pain       Home Medications Prior to Admission medications   Medication Sig Start Date End Date Taking? Authorizing Provider  albuterol (VENTOLIN HFA) 108 (90 Base) MCG/ACT inhaler Inhale 1 puff into the lungs every 6 (six) hours as needed for wheezing or shortness of breath. 03/22/21   [provider]  amLODipine (NORVASC) 10 MG tablet Take 10 mg by mouth daily. 03/31/21   [provider]  aspirin EC 81 MG tablet Take 81 mg by mouth daily. Swallow whole.    [provider]  atorvastatin (LIPITOR) 80 MG tablet Take 80 mg by mouth daily. 04/30/21   [provider]  clopidogrel (PLAVIX) 75 MG tablet Take 75 mg by mouth daily. 04/30/21   [provider]  furosemide (LASIX) 80 MG tablet Take 80 mg by mouth daily. 04/30/21   [provider]  gabapentin (NEURONTIN) 100 MG capsule Take 1 capsule (100 mg total) by mouth 2 (two) times daily. 05/06/21   Regalado, Belkys A, MD  guaiFENesin (MUCINEX) 600 MG 12 hr tablet Take 1 tablet (600 mg total) by mouth 2 (two) times daily. 05/06/21   Regalado, Belkys A, MD  metoprolol tartrate (LOPRESSOR) 50 MG  tablet Take 50 mg by mouth 2 (two) times daily. 03/31/21   [provider]  Multiple Vitamin (MULTIVITAMIN WITH MINERALS) TABS tablet Take 1 tablet by mouth daily.    [provider]  nitroGLYCERIN (NITROSTAT) 0.4 MG SL tablet Place 0.4 mg under the tongue every 5 (five) minutes as needed for chest pain. 02/02/21   [provider]  oxybutynin (DITROPAN-XL) 5 MG 24 hr tablet Take 5 mg by mouth daily. 04/30/21   [provider]  pantoprazole (PROTONIX) 40 MG tablet Take 1 tablet (40 mg total) by mouth daily. 05/06/21   Regalado, Belkys A, MD  tamsulosin (FLOMAX) 0.4 MG CAPS capsule Take 0.4 mg by mouth daily. 04/30/21   [provider]  TRELEGY ELLIPTA 100-62.5-25 MCG/ACT AEPB Inhale 1 puff into the lungs daily. 04/11/21   [provider]      Allergies    Patient has no known allergies.    Review of Systems   Review of Systems  Respiratory:  Positive for shortness of breath.   Cardiovascular:  Positive for leg swelling.  Gastrointestinal:  Positive for abdominal distention and abdominal pain.  All other systems reviewed and are negative.  Physical Exam Updated Vital Signs BP 140/83    Pulse 79    Temp 98.7 F (37.1 C)    Resp 20    Ht 5\' 8"  (1.727 m)    Wt  111 kg    SpO2 94%    BMI 37.22 kg/m  Physical Exam Vitals and nursing note reviewed.  Constitutional:      General: He is not in acute distress.    Appearance: Normal appearance. He is obese. He is not ill-appearing, toxic-appearing or diaphoretic.  HENT:     Head: Normocephalic and atraumatic.     Nose: No nasal deformity.     Mouth/Throat:     Lips: Pink. No lesions.     Mouth: No injury, lacerations, oral lesions or angioedema.     Pharynx: Uvula midline. No uvula swelling.  Eyes:     General: Gaze aligned appropriately. No scleral icterus.       Right eye: No discharge.        Left eye: No discharge.     Conjunctiva/sclera: Conjunctivae normal.     Right eye: Right  conjunctiva is not injected. No exudate or hemorrhage.    Left eye: Left conjunctiva is not injected. No exudate or hemorrhage. Cardiovascular:     Rate and Rhythm: Normal rate and regular rhythm.     Pulses: Normal pulses.          Radial pulses are 2+ on the right side and 2+ on the left side.       Dorsalis pedis pulses are 2+ on the right side and 2+ on the left side.     Heart sounds: Normal heart sounds, S1 normal and S2 normal. Heart sounds not distant. No murmur heard.   No friction rub. No gallop. No S3 or S4 sounds.  Pulmonary:     Effort: Pulmonary effort is normal. No tachypnea, accessory muscle usage or respiratory distress.     Breath sounds: Normal breath sounds. No stridor. No wheezing, rhonchi or rales.  Chest:     Chest wall: No tenderness.  Abdominal:     General: Abdomen is flat. Bowel sounds are normal. There is distension.     Palpations: Abdomen is soft. There is no mass or pulsatile mass.     Tenderness: There is generalized abdominal tenderness. There is guarding. There is no rebound.     Hernia: A hernia is present.     Comments: Significant abdominal distension. Moderately tender on exam with some rebound and guarding present. Umbilical hernia present  Musculoskeletal:     Right lower leg: 2+ Pitting Edema present.     Left lower leg: 2+ Pitting Edema present.  Skin:    General: Skin is warm and dry.     Coloration: Skin is not jaundiced or pale.     Findings: No bruising, erythema, lesion or rash.  Neurological:     General: No focal deficit present.     Mental Status: He is alert and oriented to person, place, and time.     GCS: GCS eye subscore is 4. GCS verbal subscore is 5. GCS motor subscore is 6.  Psychiatric:        Mood and Affect: Mood normal.        Behavior: Behavior normal. Behavior is cooperative.    ED Results / Procedures / Treatments   Labs (all labs ordered are listed, but only abnormal results are displayed) Labs Reviewed   COMPREHENSIVE METABOLIC PANEL - Abnormal; Notable for the following components:      Result Value   Glucose, Bld 107 (*)    BUN 30 (*)    All other components within normal limits  RESP PANEL BY RT-PCR (FLU A&B, COVID)  ARPGX2  CBC WITH DIFFERENTIAL/PLATELET  BRAIN NATRIURETIC PEPTIDE  MAGNESIUM  LIPASE, BLOOD  URINALYSIS, ROUTINE W REFLEX MICROSCOPIC  TROPONIN I (HIGH SENSITIVITY)  TROPONIN I (HIGH SENSITIVITY)    EKG None  Radiology DG Chest 2 View  Result Date: 06/16/2021 CLINICAL DATA:  Shortness of breath, weight gain, lower extremity swelling EXAM: CHEST - 2 VIEW COMPARISON:  05/04/2021 FINDINGS: Frontal and lateral views of the chest demonstrate a stable cardiac silhouette. Increased veiling opacity at the left lung base consistent with enlarging left pleural effusion and increasing left lower lobe consolidation. Right chest is clear. No pneumothorax. No acute bony abnormalities. Prior resorption or resection of the left posterior fourth rib. IMPRESSION: 1. Progressive left basilar consolidation and effusion. Electronically Signed   By: Randa Ngo M.D.   On: 06/16/2021 18:20   CT Abdomen Pelvis W Contrast  Result Date: 06/16/2021 CLINICAL DATA:  30 pound weight gain over 4 days, lower abdominal and leg swelling, abdominal pain EXAM: CT ABDOMEN AND PELVIS WITH CONTRAST TECHNIQUE: Multidetector CT imaging of the abdomen and pelvis was performed using the standard protocol following bolus administration of intravenous contrast. RADIATION DOSE REDUCTION: This exam was performed according to the departmental dose-optimization program which includes automated exposure control, adjustment of the mA and/or kV according to patient size and/or use of iterative reconstruction technique. CONTRAST:  148mL OMNIPAQUE IOHEXOL 300 MG/ML  SOLN COMPARISON:  None. FINDINGS: Lower chest: No acute pleural or parenchymal lung disease. Mild bibasilar subpleural scarring. Hepatobiliary: Diffuse hepatic  steatosis. No focal liver abnormality. Gallbladder is unremarkable. Pancreas: Unremarkable. No pancreatic ductal dilatation or surrounding inflammatory changes. Spleen: Normal in size without focal abnormality. Adrenals/Urinary Tract: Kidneys enhance normally and symmetrically. No urinary tract calculi or obstruction. The adrenals and bladder are unremarkable. Stomach/Bowel: No bowel obstruction or ileus. Minimal diverticulosis of the descending and sigmoid colon. No evidence of acute diverticulitis. Normal appendix right lower quadrant. No bowel wall thickening or inflammatory change. Vascular/Lymphatic: Aortic atherosclerosis. No enlarged abdominal or pelvic lymph nodes. Reproductive: Prostate is unremarkable. Other: No free fluid or free gas. Fat containing umbilical hernia. No bowel herniation. Musculoskeletal: No acute or destructive bony lesions. Reconstructed images demonstrate no additional findings. IMPRESSION: 1. Distal colonic diverticulosis without diverticulitis. 2. Fat containing umbilical hernia. 3. Hepatic steatosis. 4.  Aortic Atherosclerosis (ICD10-I70.0). Electronically Signed   By: Randa Ngo M.D.   On: 06/16/2021 23:03    Procedures Procedures   Medications Ordered in ED Medications  furosemide (LASIX) injection 80 mg (80 mg Intravenous Given 06/16/21 2257)  iohexol (OMNIPAQUE) 300 MG/ML solution 100 mL (100 mLs Intravenous Contrast Given 06/16/21 2243)    ED Course/ Medical Decision Making/ A&P                           Medical Decision Making Amount and/or Complexity of Data Reviewed Radiology: ordered.  Risk Prescription drug management.   This is a 71 y.o. male with a PMH of CHF, COPD, HTN, and HLD who presents to the ED with worsening bilateral LE swelling, abdominal distension, abdominal pain, and shortness of breath. Of note, he has had a subjective 30 lb weight gain over the past week.  Vitals are stable.  Standing at 92% on room air.  Exam notable for bilateral  LE swelling. Lungs CTA bilaterally.  He also has pretty notable abdominal swelling and tenderness.   Initial Impression: Patient appears hypervolemic.  Initial labs were obtained in triage.  I personally reviewed all  laboratory work and imaging. Abnormal results outlined below.  CBC reassuring, CMP overal reassuring with mildly elevated BUN. Troponin negative. BNP negative. Mag normal. Resp panel negative. CXR reveals left lung base opacity and enlarging left pleural effusion.  Since abdomen is so tender and appears distended with possible anasarca, plan to obtain CT A/P  10:30 pm:  Care of Randall Montoya transferred to [facility] and Dr. Langston Masker at the end of my shift as the patient will require reassessment once labs/imaging have resulted. Patient presentation, ED course, and plan of care discussed with review of all pertinent labs and imaging. Please see his/her note for further details regarding further ED course and disposition. Plan at time of handoff is f/u on CT imaging of abdomen. Reassess and ambulate patient to see how shortness of breath and oxygenation are. May require admission if continues to be symptomatic. This may be altered or completely changed at the discretion of the oncoming team pending results of further workup.   I have discussed this patient with my attending physician who agrees and has made changes to the plan accordingly.  Portions of this note were generated with [employer]. Dictation errors may occur despite best attempts at proofreading.   Final Clinical Impression(s) / ED Diagnoses Final diagnoses:  None    Rx / DC Orders ED Discharge Orders     None         Adolphus Birchwood, PA-C 06/16/21 2351    Wyvonnia Dusky, MD 06/17/21 0001

## 2021-06-16 NOTE — ED Triage Notes (Signed)
Patient reports gaining almost 30 lbs over the past 4 days, reports compliance w/ lasix. Swelling in legs and abdomen, reports SOB w/ sitting and w/ exertion. R foot burning and chest discomfort.

## 2021-06-16 NOTE — ED Provider Triage Note (Signed)
Emergency Medicine Provider Triage Evaluation Note  Laith Antonelli , a 71 y.o. male  was evaluated in triage.  Pt complains of shortness of breath and leg swelling.  He reports since his dis charge he has gained almost 30 lbs.  He is compliant with his lasix per his report.  He reports he isn't urinating as much. He is coughing up clear liquid over the past few days.   Physical Exam  BP (!) 141/74 (BP Location: Left Arm)    Pulse 77    Temp 98.4 F (36.9 C) (Oral)    Resp 20    Ht 5\' 8"  (1.727 m)    Wt 111 kg    SpO2 96%    BMI 37.22 kg/m  Gen:   Awake, no distress   Resp:  Slightly increased effort.  MSK:   Moves extremities without difficulty  Other:  Palpable petal pulses bilaterally.   Medical Decision Making  Medically screening exam initiated at 5:40 PM.  Appropriate orders placed.  Jaivion Kingsley was informed that the remainder of the evaluation will be completed by another provider, this initial triage assessment does not replace that evaluation, and the importance of remaining in the ED until their evaluation is complete.     Lorin Glass, [place] 06/16/21 1747

## 2021-06-17 ENCOUNTER — Encounter (HOSPITAL_COMMUNITY): Payer: Self-pay | Admitting: Family Medicine

## 2021-06-17 ENCOUNTER — Observation Stay (HOSPITAL_COMMUNITY): Payer: Medicare Other

## 2021-06-17 DIAGNOSIS — I10 Essential (primary) hypertension: Secondary | ICD-10-CM

## 2021-06-17 DIAGNOSIS — R0902 Hypoxemia: Secondary | ICD-10-CM | POA: Diagnosis present

## 2021-06-17 DIAGNOSIS — Z7951 Long term (current) use of inhaled steroids: Secondary | ICD-10-CM | POA: Diagnosis not present

## 2021-06-17 DIAGNOSIS — Z7902 Long term (current) use of antithrombotics/antiplatelets: Secondary | ICD-10-CM | POA: Diagnosis not present

## 2021-06-17 DIAGNOSIS — Z79899 Other long term (current) drug therapy: Secondary | ICD-10-CM | POA: Diagnosis not present

## 2021-06-17 DIAGNOSIS — G4733 Obstructive sleep apnea (adult) (pediatric): Secondary | ICD-10-CM | POA: Diagnosis present

## 2021-06-17 DIAGNOSIS — J441 Chronic obstructive pulmonary disease with (acute) exacerbation: Secondary | ICD-10-CM | POA: Diagnosis present

## 2021-06-17 DIAGNOSIS — I110 Hypertensive heart disease with heart failure: Secondary | ICD-10-CM | POA: Diagnosis present

## 2021-06-17 DIAGNOSIS — Z955 Presence of coronary angioplasty implant and graft: Secondary | ICD-10-CM | POA: Diagnosis not present

## 2021-06-17 DIAGNOSIS — E785 Hyperlipidemia, unspecified: Secondary | ICD-10-CM | POA: Diagnosis present

## 2021-06-17 DIAGNOSIS — I5033 Acute on chronic diastolic (congestive) heart failure: Secondary | ICD-10-CM | POA: Diagnosis present

## 2021-06-17 DIAGNOSIS — Z20822 Contact with and (suspected) exposure to covid-19: Secondary | ICD-10-CM | POA: Diagnosis present

## 2021-06-17 DIAGNOSIS — M109 Gout, unspecified: Secondary | ICD-10-CM | POA: Diagnosis present

## 2021-06-17 DIAGNOSIS — Z7982 Long term (current) use of aspirin: Secondary | ICD-10-CM | POA: Diagnosis not present

## 2021-06-17 DIAGNOSIS — I5032 Chronic diastolic (congestive) heart failure: Secondary | ICD-10-CM

## 2021-06-17 DIAGNOSIS — T380X5A Adverse effect of glucocorticoids and synthetic analogues, initial encounter: Secondary | ICD-10-CM | POA: Diagnosis present

## 2021-06-17 DIAGNOSIS — R739 Hyperglycemia, unspecified: Secondary | ICD-10-CM | POA: Diagnosis present

## 2021-06-17 DIAGNOSIS — R0600 Dyspnea, unspecified: Secondary | ICD-10-CM | POA: Insufficient documentation

## 2021-06-17 DIAGNOSIS — Z87891 Personal history of nicotine dependence: Secondary | ICD-10-CM | POA: Diagnosis not present

## 2021-06-17 DIAGNOSIS — I252 Old myocardial infarction: Secondary | ICD-10-CM | POA: Diagnosis not present

## 2021-06-17 DIAGNOSIS — R7303 Prediabetes: Secondary | ICD-10-CM | POA: Diagnosis present

## 2021-06-17 DIAGNOSIS — I2510 Atherosclerotic heart disease of native coronary artery without angina pectoris: Secondary | ICD-10-CM | POA: Diagnosis present

## 2021-06-17 DIAGNOSIS — Z9989 Dependence on other enabling machines and devices: Secondary | ICD-10-CM

## 2021-06-17 LAB — URINALYSIS, ROUTINE W REFLEX MICROSCOPIC
Bilirubin Urine: NEGATIVE
Glucose, UA: NEGATIVE mg/dL
Hgb urine dipstick: NEGATIVE
Ketones, ur: NEGATIVE mg/dL
Leukocytes,Ua: NEGATIVE
Nitrite: NEGATIVE
Protein, ur: NEGATIVE mg/dL
Specific Gravity, Urine: 1.028 (ref 1.005–1.030)
pH: 5 (ref 5.0–8.0)

## 2021-06-17 LAB — CBC
HCT: 44.7 % (ref 39.0–52.0)
Hemoglobin: 14.8 g/dL (ref 13.0–17.0)
MCH: 29.3 pg (ref 26.0–34.0)
MCHC: 33.1 g/dL (ref 30.0–36.0)
MCV: 88.5 fL (ref 80.0–100.0)
Platelets: 213 10*3/uL (ref 150–400)
RBC: 5.05 MIL/uL (ref 4.22–5.81)
RDW: 14.4 % (ref 11.5–15.5)
WBC: 7.9 10*3/uL (ref 4.0–10.5)
nRBC: 0 % (ref 0.0–0.2)

## 2021-06-17 LAB — BASIC METABOLIC PANEL
Anion gap: 7 (ref 5–15)
BUN: 27 mg/dL — ABNORMAL HIGH (ref 8–23)
CO2: 28 mmol/L (ref 22–32)
Calcium: 9.2 mg/dL (ref 8.9–10.3)
Chloride: 103 mmol/L (ref 98–111)
Creatinine, Ser: 1.15 mg/dL (ref 0.61–1.24)
GFR, Estimated: >60 mL/min (ref >60)
Glucose, Bld: 173 mg/dL — ABNORMAL HIGH (ref 70–99)
Potassium: 3.5 mmol/L (ref 3.5–5.1)
Sodium: 138 mmol/L (ref 135–145)

## 2021-06-17 LAB — URIC ACID: Uric Acid, Serum: 11.3 mg/dL — ABNORMAL HIGH (ref 3.7–8.6)

## 2021-06-17 IMAGING — CR DG FOOT 2V*R*
2 series · 2 of 2 positions shown · non-contrast
Comparison: No priors.

CLINICAL DATA: 70-year-old male with swelling of the right foot for
the past 5 days with burning sensation in the right foot.

EXAM:
RIGHT FOOT - 2 VIEW

[x foot ap right]
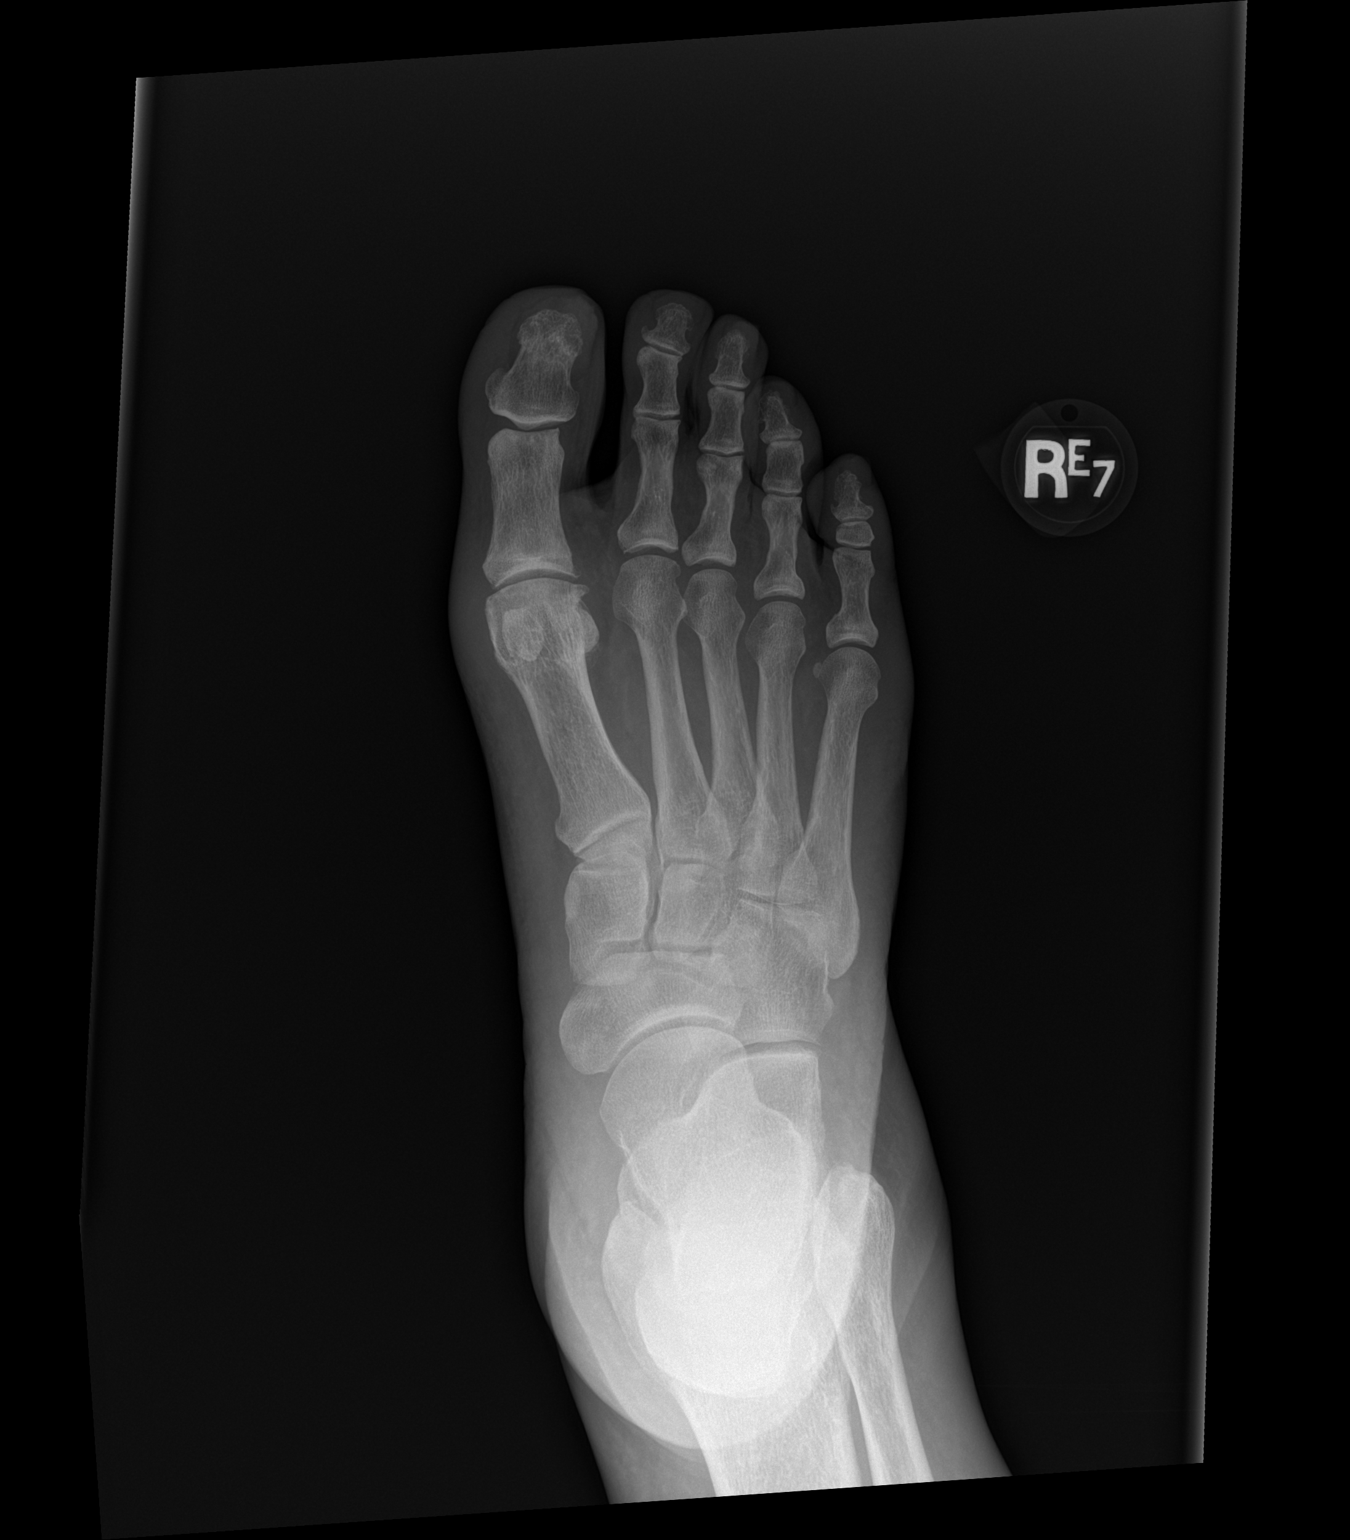

[x foot lat right]
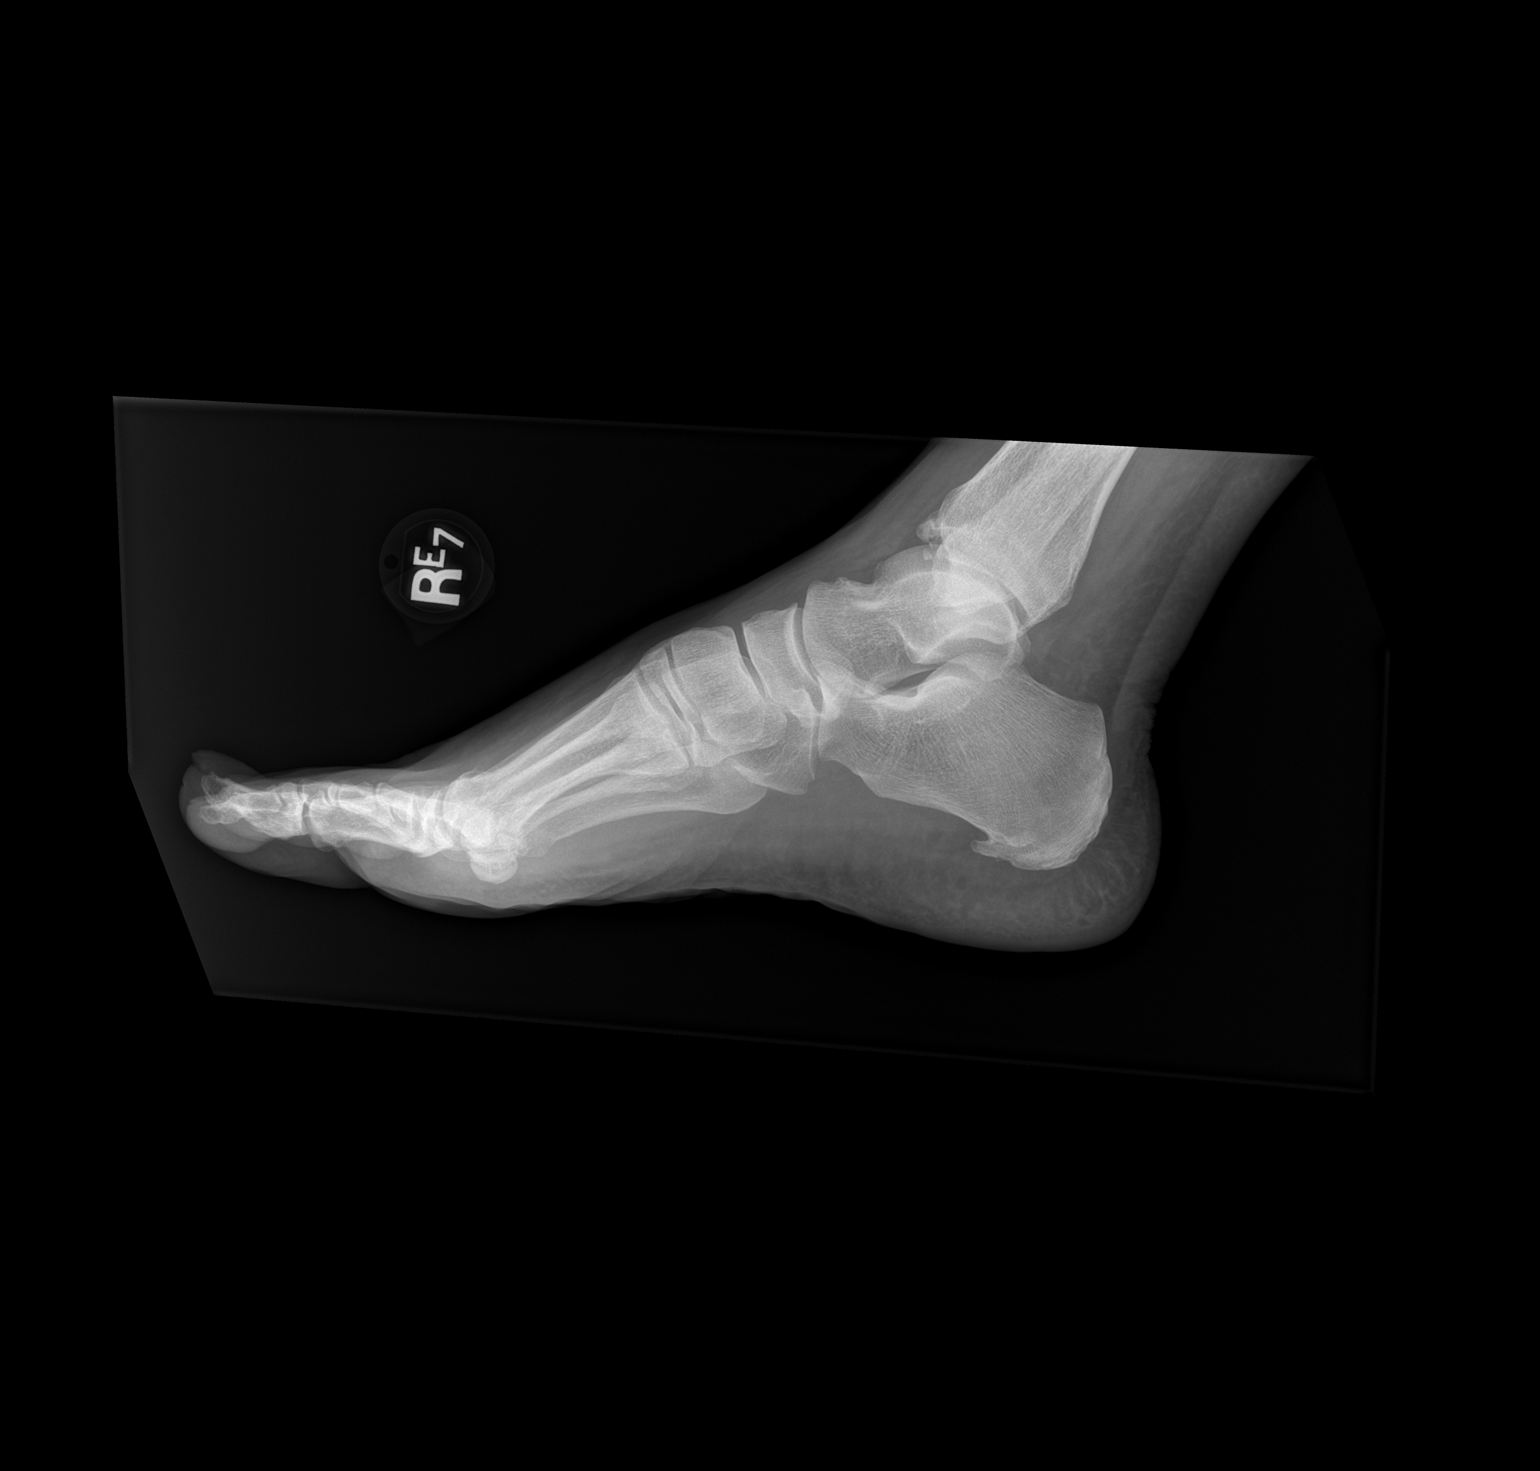

[2 of 2 positions shown; findings below may reference images not displayed]

FINDINGS: Two views of the right foot demonstrate no acute displaced fracture,
subluxation or dislocation. Small plantar calcaneal spur
incidentally noted. Mild degenerative changes of osteoarthritis,
most evident at the first MTP joint.
IMPRESSION: 1. No acute radiographic abnormality of the right foot.

## 2021-06-17 MED ORDER — PANTOPRAZOLE SODIUM 40 MG PO TBEC
40.0000 mg | DELAYED_RELEASE_TABLET | Freq: Every day | ORAL | Status: DC
  Administered 2021-06-17 – 2021-06-20 (×4): 40 mg via ORAL
  Filled 2021-06-17 (×4): qty 1

## 2021-06-17 MED ORDER — SODIUM CHLORIDE 0.9% FLUSH
3.0000 mL | Freq: Two times a day (BID) | INTRAVENOUS | Status: DC
  Administered 2021-06-17 – 2021-06-20 (×7): 3 mL via INTRAVENOUS

## 2021-06-17 MED ORDER — AMLODIPINE BESYLATE 10 MG PO TABS
10.0000 mg | ORAL_TABLET | Freq: Every day | ORAL | Status: DC
  Administered 2021-06-17 – 2021-06-20 (×4): 10 mg via ORAL
  Filled 2021-06-17 (×4): qty 1

## 2021-06-17 MED ORDER — FUROSEMIDE 40 MG PO TABS: 40.0000 mg | ORAL_TABLET | Freq: Every day | ORAL | Status: DC

## 2021-06-17 MED ORDER — GABAPENTIN 100 MG PO CAPS
100.0000 mg | ORAL_CAPSULE | Freq: Two times a day (BID) | ORAL | Status: DC
  Administered 2021-06-17 – 2021-06-20 (×7): 100 mg via ORAL
  Filled 2021-06-17 (×7): qty 1

## 2021-06-17 MED ORDER — ACETAMINOPHEN 650 MG RE SUPP: 650.0000 mg | Freq: Four times a day (QID) | RECTAL | Status: DC | PRN

## 2021-06-17 MED ORDER — POTASSIUM CHLORIDE CRYS ER 20 MEQ PO TBCR
40.0000 meq | EXTENDED_RELEASE_TABLET | Freq: Two times a day (BID) | ORAL | Status: AC
  Administered 2021-06-17 – 2021-06-18 (×2): 40 meq via ORAL
  Filled 2021-06-17 (×2): qty 2

## 2021-06-17 MED ORDER — FUROSEMIDE 40 MG PO TABS
80.0000 mg | ORAL_TABLET | Freq: Every day | ORAL | Status: DC
  Administered 2021-06-17: 80 mg via ORAL
  Filled 2021-06-17: qty 2

## 2021-06-17 MED ORDER — UMECLIDINIUM BROMIDE 62.5 MCG/ACT IN AEPB
1.0000 | INHALATION_SPRAY | Freq: Every day | RESPIRATORY_TRACT | Status: DC
  Administered 2021-06-18 – 2021-06-20 (×3): 1 via RESPIRATORY_TRACT
  Filled 2021-06-17: qty 7

## 2021-06-17 MED ORDER — TAMSULOSIN HCL 0.4 MG PO CAPS
0.4000 mg | ORAL_CAPSULE | Freq: Every day | ORAL | Status: DC
  Administered 2021-06-17 – 2021-06-20 (×4): 0.4 mg via ORAL
  Filled 2021-06-17 (×4): qty 1

## 2021-06-17 MED ORDER — ADULT MULTIVITAMIN W/MINERALS CH
1.0000 | ORAL_TABLET | Freq: Every day | ORAL | Status: DC
  Administered 2021-06-18 – 2021-06-20 (×3): 1 via ORAL
  Filled 2021-06-17 (×3): qty 1

## 2021-06-17 MED ORDER — ENOXAPARIN SODIUM 40 MG/0.4ML IJ SOSY
40.0000 mg | PREFILLED_SYRINGE | INTRAMUSCULAR | Status: DC
  Administered 2021-06-17 – 2021-06-20 (×4): 40 mg via SUBCUTANEOUS
  Filled 2021-06-17 (×4): qty 0.4

## 2021-06-17 MED ORDER — OXYCODONE HCL 5 MG PO TABS
5.0000 mg | ORAL_TABLET | Freq: Once | ORAL | Status: AC
  Administered 2021-06-17: 5 mg via ORAL
  Filled 2021-06-17: qty 1

## 2021-06-17 MED ORDER — ATORVASTATIN CALCIUM 40 MG PO TABS
80.0000 mg | ORAL_TABLET | Freq: Every day | ORAL | Status: DC
  Administered 2021-06-17 – 2021-06-20 (×4): 80 mg via ORAL
  Filled 2021-06-17 (×5): qty 2

## 2021-06-17 MED ORDER — OXYBUTYNIN CHLORIDE ER 5 MG PO TB24
5.0000 mg | ORAL_TABLET | Freq: Every day | ORAL | Status: DC
  Administered 2021-06-17 – 2021-06-20 (×4): 5 mg via ORAL
  Filled 2021-06-17 (×4): qty 1

## 2021-06-17 MED ORDER — ALBUTEROL SULFATE HFA 108 (90 BASE) MCG/ACT IN AERS
2.0000 | INHALATION_SPRAY | Freq: Once | RESPIRATORY_TRACT | Status: AC
Start: 2021-06-17 — End: 2021-06-17
  Administered 2021-06-17: 2 via RESPIRATORY_TRACT
  Filled 2021-06-17: qty 6.7

## 2021-06-17 MED ORDER — ACETAMINOPHEN 325 MG PO TABS
650.0000 mg | ORAL_TABLET | Freq: Four times a day (QID) | ORAL | Status: DC | PRN
  Administered 2021-06-17: 650 mg via ORAL
  Filled 2021-06-17 (×2): qty 2

## 2021-06-17 MED ORDER — IPRATROPIUM BROMIDE 0.02 % IN SOLN
0.5000 mg | Freq: Once | RESPIRATORY_TRACT | Status: AC
Start: 2021-06-17 — End: 2021-06-17
  Administered 2021-06-17: 0.5 mg via RESPIRATORY_TRACT
  Filled 2021-06-17: qty 2.5

## 2021-06-17 MED ORDER — COLCHICINE 0.6 MG PO TABS
0.6000 mg | ORAL_TABLET | Freq: Once | ORAL | Status: AC
  Administered 2021-06-17: 0.6 mg via ORAL
  Filled 2021-06-17: qty 1

## 2021-06-17 MED ORDER — GUAIFENESIN ER 600 MG PO TB12
600.0000 mg | ORAL_TABLET | Freq: Two times a day (BID) | ORAL | Status: DC
  Administered 2021-06-17 – 2021-06-20 (×6): 600 mg via ORAL
  Filled 2021-06-17 (×6): qty 1

## 2021-06-17 MED ORDER — IPRATROPIUM-ALBUTEROL 0.5-2.5 (3) MG/3ML IN SOLN
3.0000 mL | Freq: Three times a day (TID) | RESPIRATORY_TRACT | Status: DC
  Administered 2021-06-17 – 2021-06-20 (×10): 3 mL via RESPIRATORY_TRACT
  Filled 2021-06-17 (×10): qty 3

## 2021-06-17 MED ORDER — FLUTICASONE FUROATE-VILANTEROL 100-25 MCG/ACT IN AEPB
1.0000 | INHALATION_SPRAY | Freq: Every day | RESPIRATORY_TRACT | Status: DC
  Administered 2021-06-18 – 2021-06-20 (×3): 1 via RESPIRATORY_TRACT
  Filled 2021-06-17: qty 28

## 2021-06-17 MED ORDER — OXYCODONE HCL 5 MG PO TABS: 5.0000 mg | ORAL_TABLET | ORAL | Status: DC | PRN

## 2021-06-17 MED ORDER — METOPROLOL TARTRATE 50 MG PO TABS
50.0000 mg | ORAL_TABLET | Freq: Two times a day (BID) | ORAL | Status: DC
  Administered 2021-06-17 – 2021-06-20 (×7): 50 mg via ORAL
  Filled 2021-06-17 (×2): qty 1
  Filled 2021-06-17: qty 2
  Filled 2021-06-17 (×4): qty 1

## 2021-06-17 MED ORDER — FUROSEMIDE 10 MG/ML IJ SOLN
80.0000 mg | Freq: Two times a day (BID) | INTRAMUSCULAR | Status: DC
  Administered 2021-06-17 – 2021-06-18 (×2): 80 mg via INTRAVENOUS
  Filled 2021-06-17 (×2): qty 8

## 2021-06-17 MED ORDER — PREDNISONE 20 MG PO TABS
40.0000 mg | ORAL_TABLET | Freq: Every day | ORAL | Status: DC
  Administered 2021-06-17: 40 mg via ORAL
  Filled 2021-06-17: qty 2

## 2021-06-17 MED ORDER — ACETAMINOPHEN 325 MG PO TABS
650.0000 mg | ORAL_TABLET | Freq: Once | ORAL | Status: AC
  Administered 2021-06-17: 650 mg via ORAL
  Filled 2021-06-17: qty 2

## 2021-06-17 MED ORDER — SODIUM CHLORIDE 0.9 % IV SOLN
500.0000 mg | INTRAVENOUS | Status: AC
  Administered 2021-06-17: 500 mg via INTRAVENOUS
  Filled 2021-06-17: qty 5

## 2021-06-17 MED ORDER — METHYLPREDNISOLONE SODIUM SUCC 40 MG IJ SOLR
40.0000 mg | Freq: Two times a day (BID) | INTRAMUSCULAR | Status: DC
  Administered 2021-06-17 – 2021-06-19 (×6): 40 mg via INTRAVENOUS
  Filled 2021-06-17 (×6): qty 1

## 2021-06-17 MED ORDER — SENNOSIDES-DOCUSATE SODIUM 8.6-50 MG PO TABS: 1.0000 | ORAL_TABLET | Freq: Every evening | ORAL | Status: DC | PRN

## 2021-06-17 MED ORDER — ALBUTEROL SULFATE (2.5 MG/3ML) 0.083% IN NEBU: 2.5000 mg | INHALATION_SOLUTION | RESPIRATORY_TRACT | Status: DC | PRN

## 2021-06-17 MED ORDER — CLOPIDOGREL BISULFATE 75 MG PO TABS
75.0000 mg | ORAL_TABLET | Freq: Every day | ORAL | Status: DC
  Administered 2021-06-17 – 2021-06-20 (×4): 75 mg via ORAL
  Filled 2021-06-17 (×4): qty 1

## 2021-06-17 MED ORDER — AZITHROMYCIN 250 MG PO TABS
500.0000 mg | ORAL_TABLET | Freq: Every day | ORAL | Status: DC
  Administered 2021-06-18 – 2021-06-20 (×3): 500 mg via ORAL
  Filled 2021-06-17 (×3): qty 2

## 2021-06-17 MED ORDER — ASPIRIN EC 81 MG PO TBEC
81.0000 mg | DELAYED_RELEASE_TABLET | Freq: Every day | ORAL | Status: DC
  Administered 2021-06-17 – 2021-06-20 (×4): 81 mg via ORAL
  Filled 2021-06-17 (×4): qty 1

## 2021-06-17 NOTE — Plan of Care (Signed)

## 2021-06-17 NOTE — ED Provider Notes (Signed)
Care assumed from [place], PA-C at shift change. Patient c/o worsening BLE edema x 4 days with worsening SOB, DOE and abdominal distension. Symptoms worsening despite compliance with Lasix 80mg  in the morning and 40mg  at night.   Discharge weight on 05/06/21 was 229 pounds. He had urine output of 4.9L during this admission. Echo showed diastolic dysfunction. Today, weight is 242 pounds.   CXR shows progressive L basilar consolidation and effusion. By my interpretation; however, this appears fairly stable. Abdominal CT without acute findings. No ascites or findings of anasarca. Labs are WNL today. Had BNP of 12 on admission in [place]; this has trended to 65.8 today.  Patient is hypoxic to 84% on RA with minimal exertion. Continues to c/o SOB. Was not fully ambulated due to c/o foot pain which is chronic; however movement from the bed in an attempt to stand elicited transient hypoxemia. This is after diuresis with IV Lasix and approximately 2L urine output. He has been otherwise stable at 92-93% on RA at rest.   Case discussed with Dr. Myna Hidalgo of [facility] who will assess patient in the ED for admission.   Results for orders placed or performed during the hospital encounter of 06/16/21  Resp Panel by RT-PCR (Flu A&B, Covid) Nasopharyngeal Swab   Specimen: Nasopharyngeal Swab; Nasopharyngeal(NP) swabs in vial transport medium  Result Value Ref Range   SARS Coronavirus 2 by RT PCR NEGATIVE NEGATIVE   Influenza A by PCR NEGATIVE NEGATIVE   Influenza B by PCR NEGATIVE NEGATIVE  Comprehensive metabolic panel  Result Value Ref Range   Sodium 138 135 - 145 mmol/L   Potassium 3.6 3.5 - 5.1 mmol/L   Chloride 105 98 - 111 mmol/L   CO2 23 22 - 32 mmol/L   Glucose, Bld 107 (H) 70 - 99 mg/dL   BUN 30 (H) 8 - 23 mg/dL   Creatinine, Ser 1.19 0.61 - 1.24 mg/dL   Calcium 9.0 8.9 - 10.3 mg/dL   Total Protein 7.2 6.5 - 8.1 g/dL   Albumin 4.4 3.5 - 5.0 g/dL   AST 19 15 - 41 U/L   ALT 28 0 - 44 U/L   Alkaline  Phosphatase 95 38 - 126 U/L   Total Bilirubin 0.7 0.3 - 1.2 mg/dL   GFR, Estimated >60 >60 mL/min   Anion gap 10 5 - 15  CBC with Differential  Result Value Ref Range   WBC 6.5 4.0 - 10.5 K/uL   RBC 5.25 4.22 - 5.81 MIL/uL   Hemoglobin 15.4 13.0 - 17.0 g/dL   HCT 46.6 39.0 - 52.0 %   MCV 88.8 80.0 - 100.0 fL   MCH 29.3 26.0 - 34.0 pg   MCHC 33.0 30.0 - 36.0 g/dL   RDW 14.6 11.5 - 15.5 %   Platelets 233 150 - 400 K/uL   nRBC 0.0 0.0 - 0.2 %   Neutrophils Relative % 54 %   Neutro Abs 3.5 1.7 - 7.7 K/uL   Lymphocytes Relative 31 %   Lymphs Abs 2.0 0.7 - 4.0 K/uL   Monocytes Relative 11 %   Monocytes Absolute 0.7 0.1 - 1.0 K/uL   Eosinophils Relative 2 %   Eosinophils Absolute 0.1 0.0 - 0.5 K/uL   Basophils Relative 1 %   Basophils Absolute 0.0 0.0 - 0.1 K/uL   Immature Granulocytes 1 %   Abs Immature Granulocytes 0.04 0.00 - 0.07 K/uL  Brain natriuretic peptide  Result Value Ref Range   B Natriuretic Peptide 65.8 0.0 -  100.0 pg/mL  Magnesium  Result Value Ref Range   Magnesium 2.1 1.7 - 2.4 mg/dL  Lipase, blood  Result Value Ref Range   Lipase 43 11 - 51 U/L  Troponin I (High Sensitivity)  Result Value Ref Range   Troponin I (High Sensitivity) 5 <18 ng/L  Troponin I (High Sensitivity)  Result Value Ref Range   Troponin I (High Sensitivity) 4 <18 ng/L   DG Chest 2 View  Result Date: 06/16/2021 CLINICAL DATA:  Shortness of breath, weight gain, lower extremity swelling EXAM: CHEST - 2 VIEW COMPARISON:  05/04/2021 FINDINGS: Frontal and lateral views of the chest demonstrate a stable cardiac silhouette. Increased veiling opacity at the left lung base consistent with enlarging left pleural effusion and increasing left lower lobe consolidation. Right chest is clear. No pneumothorax. No acute bony abnormalities. Prior resorption or resection of the left posterior fourth rib. IMPRESSION: 1. Progressive left basilar consolidation and effusion. Electronically Signed   By: Randa Ngo M.D.   On: 06/16/2021 18:20   MR Cervical Spine w/o contrast  Result Date: 05/23/2021 CLINICAL DATA:  Spondylosis, C6-7.  Neck pain. EXAM: MRI CERVICAL SPINE WITHOUT CONTRAST TECHNIQUE: Multiplanar, multisequence MR imaging of the cervical spine was performed. No intravenous contrast was administered. COMPARISON:  Radiographs April 19, 2021. FINDINGS: The study is partially degraded by motion. Alignment: Straightening of the cervical curvature. Vertebrae: No fracture, evidence of discitis, or bone lesion. Cord: Mass effect on the cord at C6-7 without definitive cord signal abnormality. Posterior Fossa, vertebral arteries, paraspinal tissues: Negative. Disc levels: C1-2: Right-sided atlantoaxial joint effusion. C2-3: Facet degenerative changes resulting in mild left neural foraminal narrowing. No spinal canal stenosis. C3-4: Posterior disc osteophyte complex causing indentation of the thecal sac and contacting the anterior cord surface without significant spinal canal stenosis. Facet degenerative changes without significant neural foraminal narrowing. C4-5: Mild facet degenerative changes. No significant spinal canal or neural foraminal stenosis. C5-6: Posterior disc osteophyte complex with ossification of the posterior longitudinal ligament abutting the anterior cord surface resulting in mild spinal canal stenosis. Uncovertebral and facet degenerative changes resulting in mild bilateral neural foraminal narrowing. C6-7: Posterior disc osteophyte complex with ossification of the posterior longitudinal ligament resulting in mild mass effect on the cord and mild to moderate spinal canal stenosis. Uncovertebral and facet degenerative changes resulting in severe right and moderate left neural foraminal narrowing. C7-T1: Small disc bulge and mild facet degenerative changes resulting in mild bilateral neural foraminal narrowing. No significant spinal canal stenosis. IMPRESSION: 1. Degenerative changes of the  cervical spine, more pronounced at C5-6 and C6-7 where there is ossification of the posterior longitudinal ligament. 2. 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 3. Severe right and moderate left neural foraminal narrowing at C6-7. Electronically Signed   By: Pedro Earls M.D.   On: 05/23/2021 11:00   CT Abdomen Pelvis W Contrast  Result Date: 06/16/2021 CLINICAL DATA:  30 pound weight gain over 4 days, lower abdominal and leg swelling, abdominal pain EXAM: CT ABDOMEN AND PELVIS WITH CONTRAST TECHNIQUE: Multidetector CT imaging of the abdomen and pelvis was performed using the standard protocol following bolus administration of intravenous contrast. RADIATION DOSE REDUCTION: This exam was performed according to the departmental dose-optimization program which includes automated exposure control, adjustment of the mA and/or kV according to patient size and/or use of iterative reconstruction technique. CONTRAST:  165mL OMNIPAQUE IOHEXOL 300 MG/ML  SOLN COMPARISON:  None. FINDINGS: Lower chest: No acute  pleural or parenchymal lung disease. Mild bibasilar subpleural scarring. Hepatobiliary: Diffuse hepatic steatosis. No focal liver abnormality. Gallbladder is unremarkable. Pancreas: Unremarkable. No pancreatic ductal dilatation or surrounding inflammatory changes. Spleen: Normal in size without focal abnormality. Adrenals/Urinary Tract: Kidneys enhance normally and symmetrically. No urinary tract calculi or obstruction. The adrenals and bladder are unremarkable. Stomach/Bowel: No bowel obstruction or ileus. Minimal diverticulosis of the descending and sigmoid colon. No evidence of acute diverticulitis. Normal appendix right lower quadrant. No bowel wall thickening or inflammatory change. Vascular/Lymphatic: Aortic atherosclerosis. No enlarged abdominal or pelvic lymph nodes. Reproductive: Prostate is unremarkable. Other: No free fluid or free gas. Fat containing umbilical  hernia. No bowel herniation. Musculoskeletal: No acute or destructive bony lesions. Reconstructed images demonstrate no additional findings. IMPRESSION: 1. Distal colonic diverticulosis without diverticulitis. 2. Fat containing umbilical hernia. 3. Hepatic steatosis. 4.  Aortic Atherosclerosis (ICD10-I70.0). Electronically Signed   By: Randa Ngo M.D.   On: 06/16/2021 23:03   XR Cervical Spine 2 or 3 views  Result Date: 05/30/2021 Lateral cervical flexion-extension x-rays are obtained and reviewed.  This shows trace motion at C6-7 where there is disc space narrowing and spondylosis.  More mobility at C5-6.  Comparison to 05/23/2021 cervical MRI scan which showed posterior longitudinal ligament calcification. Impression: Minimal mobility C6-7.  C5-6 level appears mobile.     Antonietta Breach, PA-C 06/17/21 0404    Ezequiel Essex, MD 06/17/21 [phone]

## 2021-06-17 NOTE — H&P (Signed)
History and Physical    Randall Montoya DEY:[id number] DOB: 1951-01-06 DOA: 06/16/2021  PCP: Mardi Mainland, FNP   Patient coming from: Home   Chief Complaint: SOB, wt gain, swelling, productive cough   HPI: Randall Montoya is a pleasant 71 y.o. male with medical history significant for COPD, OSA, HFpEF, CAD, and hypertension, now presenting to the emergency department with worsening shortness of breath, swelling, weight gain, and productive cough.  Patient reports recent weight gain and increased swelling, was instructed to increase his Lasix which he has done, but reports worsening shortness of breath.  He also reports cough productive of thick white sputum and wheezing that improves briefly with inhaler at home.  Denies fevers or chills.  ED Course: Upon arrival to the ED, patient is found to be afebrile, saturating mid 90s on room air while at rest.  Chest x-ray with progressive left basilar opacity.  CT of the abdomen and pelvis is negative for acute findings.  Chemistry panel with BUN 30.  CBC unremarkable.  Troponin is normal x2 and BNP is normal.  COVID and influenza were negative.  Patient was given 80 mg IV Lasix, oxycodone, albuterol, Atrovent, acetaminophen, and placed on CPAP in the ED.  Review of Systems:  All other systems reviewed and apart from HPI, are negative.  Past Medical History:  Diagnosis Date   Arthritis    Brain syndrome    Cataract    Congestive heart failure (CHF) (HCC)    COPD (chronic obstructive pulmonary disease) (HCC)    Hyperlipidemia    Hypertension    Kidney failure    Prediabetes    Sleep apnea     Past Surgical History:  Procedure Laterality Date   heart stent     myocardial infarction      Social History:   reports that he has quit smoking. His smoking use included cigarettes. He has never used smokeless tobacco. He reports that he does not currently use alcohol. He reports that he does not use drugs.  No Known Allergies  History  reviewed. No pertinent family history.   Prior to Admission medications   Medication Sig Start Date End Date Taking? Authorizing Provider  albuterol (VENTOLIN HFA) 108 (90 Base) MCG/ACT inhaler Inhale 1 puff into the lungs every 6 (six) hours as needed for wheezing or shortness of breath. 03/22/21   [provider]  amLODipine (NORVASC) 10 MG tablet Take 10 mg by mouth daily. 03/31/21   [provider]  aspirin EC 81 MG tablet Take 81 mg by mouth daily. Swallow whole.    [provider]  atorvastatin (LIPITOR) 80 MG tablet Take 80 mg by mouth daily. 04/30/21   [provider]  clopidogrel (PLAVIX) 75 MG tablet Take 75 mg by mouth daily. 04/30/21   [provider]  furosemide (LASIX) 80 MG tablet Take 80 mg by mouth daily. 04/30/21   [provider]  gabapentin (NEURONTIN) 100 MG capsule Take 1 capsule (100 mg total) by mouth 2 (two) times daily. 05/06/21   Regalado, Belkys A, MD  guaiFENesin (MUCINEX) 600 MG 12 hr tablet Take 1 tablet (600 mg total) by mouth 2 (two) times daily. 05/06/21   Regalado, Belkys A, MD  metoprolol tartrate (LOPRESSOR) 50 MG tablet Take 50 mg by mouth 2 (two) times daily. 03/31/21   [provider]  Multiple Vitamin (MULTIVITAMIN WITH MINERALS) TABS tablet Take 1 tablet by mouth daily.    [provider]  nitroGLYCERIN (NITROSTAT) 0.4 MG SL  tablet Place 0.4 mg under the tongue every 5 (five) minutes as needed for chest pain. 02/02/21   [provider]  oxybutynin (DITROPAN-XL) 5 MG 24 hr tablet Take 5 mg by mouth daily. 04/30/21   [provider]  pantoprazole (PROTONIX) 40 MG tablet Take 1 tablet (40 mg total) by mouth daily. 05/06/21   Regalado, Belkys A, MD  tamsulosin (FLOMAX) 0.4 MG CAPS capsule Take 0.4 mg by mouth daily. 04/30/21   [provider]  TRELEGY ELLIPTA 100-62.5-25 MCG/ACT AEPB Inhale 1 puff into the lungs daily. 04/11/21   [provider]     Physical Exam: Vitals:   06/17/21 0303 06/17/21 0315 06/17/21 0415 06/17/21 0421  BP:  126/64 127/73 130/78  Pulse:  77 80 78  Resp:  20 (!) 22 20  Temp:      TempSrc:      SpO2:  98% 96% 97%  Weight: 109.8 kg     Height:        Constitutional: NAD, calm  Eyes: PERTLA, lids and conjunctivae normal ENMT: Mucous membranes are moist. Posterior pharynx clear of any exudate or lesions.   Neck: supple, no masses  Respiratory: Prolonged expiratory phase with expiratory wheezes. No accessory muscle use.  Cardiovascular: S1 & S2 heard, regular rate and rhythm. Mild lower leg edema b/l. No JVD.  Abdomen: No distension, no tenderness, soft. Bowel sounds active.  Musculoskeletal: no clubbing / cyanosis. No joint deformity upper and lower extremities.   Skin: no significant rashes, lesions, ulcers. Warm, dry, well-perfused. Neurologic: CN 2-12 grossly intact. Moving all extremities. Alert and oriented.  Psychiatric: Calm. Cooperative.    Labs and Imaging on Admission: I have personally reviewed following labs and imaging studies  CBC: Recent Labs  Lab 06/16/21 1829  WBC 6.5  NEUTROABS 3.5  HGB 15.4  HCT 46.6  MCV 88.8  PLT 546   Basic Metabolic Panel: Recent Labs  Lab 06/16/21 1829  NA 138  K 3.6  CL 105  CO2 23  GLUCOSE 107*  BUN 30*  CREATININE 1.19  CALCIUM 9.0  MG 2.1   GFR: Estimated Creatinine Clearance: 70.4 mL/min (by C-G formula based on SCr of 1.19 mg/dL). Liver Function Tests: Recent Labs  Lab 06/16/21 1829  AST 19  ALT 28  ALKPHOS 95  BILITOT 0.7  PROT 7.2  ALBUMIN 4.4   Recent Labs  Lab 06/16/21 2200  LIPASE 43   No results for input(s): AMMONIA in the last 168 hours. Coagulation Profile: No results for input(s): INR, PROTIME in the last 168 hours. Cardiac Enzymes: No results for input(s): CKTOTAL, CKMB, CKMBINDEX, TROPONINI in the last 168 hours. BNP (last 3 results) No results for input(s): PROBNP in the last 8760 hours. HbA1C: No  results for input(s): HGBA1C in the last 72 hours. CBG: No results for input(s): GLUCAP in the last 168 hours. Lipid Profile: No results for input(s): CHOL, HDL, LDLCALC, TRIG, CHOLHDL, LDLDIRECT in the last 72 hours. Thyroid Function Tests: No results for input(s): TSH, T4TOTAL, FREET4, T3FREE, THYROIDAB in the last 72 hours. Anemia Panel: No results for input(s): VITAMINB12, FOLATE, FERRITIN, TIBC, IRON, RETICCTPCT in the last 72 hours. Urine analysis: No results found for: COLORURINE, APPEARANCEUR, LABSPEC, PHURINE, GLUCOSEU, HGBUR, BILIRUBINUR, KETONESUR, PROTEINUR, UROBILINOGEN, NITRITE, LEUKOCYTESUR Sepsis Labs: @LABRCNTIP (procalcitonin:4,lacticidven:4) ) Recent Results (from the past 240 hour(s))  Resp Panel by RT-PCR (Flu A&B, Covid) Nasopharyngeal Swab     Status: None   Collection Time: 06/16/21  6:30 PM   Specimen: Nasopharyngeal  Swab; Nasopharyngeal(NP) swabs in vial transport medium  Result Value Ref Range Status   SARS Coronavirus 2 by RT PCR NEGATIVE NEGATIVE Final    Comment: (NOTE) SARS-CoV-2 target nucleic acids are NOT DETECTED.  The SARS-CoV-2 RNA is generally detectable in upper respiratory specimens during the acute phase of infection. The lowest concentration of SARS-CoV-2 viral copies this assay can detect is 138 copies/mL. A negative result does not preclude SARS-Cov-2 infection and should not be used as the sole basis for treatment or other patient management decisions. A negative result may occur with  improper specimen collection/handling, submission of specimen other than nasopharyngeal swab, presence of viral mutation(s) within the areas targeted by this assay, and inadequate number of viral copies(<138 copies/mL). A negative result must be combined with clinical observations, patient history, and epidemiological information. The expected result is Negative.  Fact Sheet for Patients:  EntrepreneurPulse.com.au  Fact Sheet for  Healthcare Providers:  IncredibleEmployment.be  This test is no t yet approved or cleared by the [place] FDA and  has been authorized for detection and/or diagnosis of SARS-CoV-2 by FDA under an Emergency Use Authorization (EUA). This EUA will remain  in effect (meaning this test can be used) for the duration of the COVID-19 declaration under Section 564(b)(1) of the Act, 21 U.S.C.section 360bbb-3(b)(1), unless the authorization is terminated  or revoked sooner.       Influenza A by PCR NEGATIVE NEGATIVE Final   Influenza B by PCR NEGATIVE NEGATIVE Final    Comment: (NOTE) The Xpert Xpress SARS-CoV-2/FLU/RSV plus assay is intended as an aid in the diagnosis of influenza from Nasopharyngeal swab specimens and should not be used as a sole basis for treatment. Nasal washings and aspirates are unacceptable for Xpert Xpress SARS-CoV-2/FLU/RSV testing.  Fact Sheet for Patients: EntrepreneurPulse.com.au  Fact Sheet for Healthcare Providers: IncredibleEmployment.be  This test is not yet approved or cleared by the [place] FDA and has been authorized for detection and/or diagnosis of SARS-CoV-2 by FDA under an Emergency Use Authorization (EUA). This EUA will remain in effect (meaning this test can be used) for the duration of the COVID-19 declaration under Section 564(b)(1) of the Act, 21 U.S.C. section 360bbb-3(b)(1), unless the authorization is terminated or revoked.  Performed at [facility], [place] [address], [place], [place] [address]      Radiological Exams on Admission: DG Chest 2 View  Result Date: 06/16/2021 CLINICAL DATA:  Shortness of breath, weight gain, lower extremity swelling EXAM: CHEST - 2 VIEW COMPARISON:  05/04/2021 FINDINGS: Frontal and lateral views of the chest demonstrate a stable cardiac silhouette. Increased veiling opacity at the left lung base consistent with enlarging left  pleural effusion and increasing left lower lobe consolidation. Right chest is clear. No pneumothorax. No acute bony abnormalities. Prior resorption or resection of the left posterior fourth rib. IMPRESSION: 1. Progressive left basilar consolidation and effusion. Electronically Signed   By: Randa Ngo M.D.   On: 06/16/2021 18:20   CT Abdomen Pelvis W Contrast  Result Date: 06/16/2021 CLINICAL DATA:  30 pound weight gain over 4 days, lower abdominal and leg swelling, abdominal pain EXAM: CT ABDOMEN AND PELVIS WITH CONTRAST TECHNIQUE: Multidetector CT imaging of the abdomen and pelvis was performed using the standard protocol following bolus administration of intravenous contrast. RADIATION DOSE REDUCTION: This exam was performed according to the departmental dose-optimization program which includes automated exposure control, adjustment of the mA and/or kV according to patient size and/or use of iterative reconstruction technique. CONTRAST:  168mL OMNIPAQUE IOHEXOL 300 MG/ML  SOLN COMPARISON:  None. FINDINGS: Lower chest: No acute pleural or parenchymal lung disease. Mild bibasilar subpleural scarring. Hepatobiliary: Diffuse hepatic steatosis. No focal liver abnormality. Gallbladder is unremarkable. Pancreas: Unremarkable. No pancreatic ductal dilatation or surrounding inflammatory changes. Spleen: Normal in size without focal abnormality. Adrenals/Urinary Tract: Kidneys enhance normally and symmetrically. No urinary tract calculi or obstruction. The adrenals and bladder are unremarkable. Stomach/Bowel: No bowel obstruction or ileus. Minimal diverticulosis of the descending and sigmoid colon. No evidence of acute diverticulitis. Normal appendix right lower quadrant. No bowel wall thickening or inflammatory change. Vascular/Lymphatic: Aortic atherosclerosis. No enlarged abdominal or pelvic lymph nodes. Reproductive: Prostate is unremarkable. Other: No free fluid or free gas. Fat containing umbilical hernia. No  bowel herniation. Musculoskeletal: No acute or destructive bony lesions. Reconstructed images demonstrate no additional findings. IMPRESSION: 1. Distal colonic diverticulosis without diverticulitis. 2. Fat containing umbilical hernia. 3. Hepatic steatosis. 4.  Aortic Atherosclerosis (ICD10-I70.0). Electronically Signed   By: Randa Ngo M.D.   On: 06/16/2021 23:03    EKG: Independently reviewed. Sinus rhythm.   Assessment/Plan   1. COPD exacerbation  - Presents with increased SOB, cough, and sputum production  - Treated with nebs in ED and reports improvement but still dyspneic at rest and wheezing  - Check sputum culture, start systemic steroid and antibiotic, schedule SAMA/SABA, and use additional SABA prn    2. Chronic diastolic CHF  - Diuresed 2 L in ED after a dose of IV Lasix and is lying flat at time of admission, does not appear to be in acute CHF  - EF was 60-65% in December 2022  - He has been taking increased Lasix at home, 80 mg in am and 40 in PM, will continue this for now, monitor wt and I/Os    3. CAD  - Hx of stent in 2013  - No anginal complaints  - Continue antiplatelets, statin, beta-blocker    4. OSA  - Continue CPAP qHS    DVT prophylaxis: Lovenox  Code Status: Full  Level of Care: Level of care: Telemetry Family Communication: None present  Disposition Plan:  Patient is from: Home  Anticipated d/c is to: Home  Anticipated d/c date is: 1/29 or 06/19/21  Patient currently: Pending improvement in respiratory status  Consults called: none  Admission status: Observation     Vianne Bulls, MD [facility]  06/17/2021, 5:21 AM

## 2021-06-17 NOTE — Progress Notes (Addendum)
PROGRESS NOTE  Randall Montoya YOV:[id number] DOB: 1951-02-17 DOA: 06/16/2021 PCP: Mardi Mainland, FNP   LOS: 0 days   Brief narrative:  Goerge Mohr is a pleasant 71 y.o. male with past medical history of COPD, obstructive sleep apnea, heart failure with preserved ejection fraction, coronary artery disease and hypertension presented hospital with worsening shortness of breath weight gain and productive cough.  In the ED, patient was saturating in the mid 80s on room air.  Chest x-ray showed progressive left basilar opacity.  CT scan of the abdomen pelvis was negative for acute findings.  Troponin was negative.  Patient was given 80 mg IV Lasix albuterol and was admitted to hospital for further evaluation and treatment.  Assessment/Plan:  Principal Problem:   COPD with acute exacerbation (HCC) Active Problems:   Chronic diastolic CHF (congestive heart failure) (HCC)   Hypertension   OSA on CPAP   CAD (coronary artery disease)   Acute COPD exacerbation  Patient presenting with acute shortness of breath cough and sputum production.  Continue with bronchodilators and resume home inhalers..  On IV Solu-Medrol twice daily.   Acute on chronic diastolic CHF  Patient received IV diuretic in the ED with significant diuresis.  2D echocardiogram showed LV ejection fraction of 60 to 65%.  Patient had been taking decreased dose of Lasix at home.  Continue aspirin and Lipitor.  Continue with IV Lasix 80 twice daily for now.  Strict intake and output charting Daily weights.    CAD status post stent in 2013 Continue antiplatelets with aspirin Plavix, statin and beta-blocker.  No active issues.  No chest pain.  Right dorsal foot erythema with swelling of the great toe.  Possibility of gout.  Uric acid elevated at 11.3.  Patient is already on IV steroids.  Renal function is stable so we will give 1 dose of colchicine today at 0.60 mg x 1.  Will reassess in AM.    OSA  Continue CPAP at  bedtime..  DVT prophylaxis: enoxaparin (LOVENOX) injection 40 mg Start: 06/17/21 1000   Code Status: Full code  Family Communication:  Spoke with the patient at bedside.  Spoke with the patient's sister on the phone and updated her about the clinical condition of the patient as well.  Status is: Observation  The patient will require care spanning > 2 midnights and should be moved to inpatient because: Decompensated heart failure, IV diuresis, COPD exacerbation  Consultants: None  Procedures: None  Anti-infectives:  Azithromycin  Anti-infectives (From admission, onward)    Start     Dose/Rate Route Frequency Ordered Stop   06/18/21 0530  azithromycin (ZITHROMAX) tablet 500 mg       See Hyperspace for full Linked Orders Report.   500 mg Oral Daily 06/17/21 0521 06/22/21 0959   06/17/21 0530  azithromycin (ZITHROMAX) 500 mg in sodium chloride 0.9 % 250 mL IVPB       See Hyperspace for full Linked Orders Report.   500 mg 250 mL/hr over 60 Minutes Intravenous Every 24 hours 06/17/21 0521 06/17/21 0755      Subjective: Today, patient was seen and examined at bedside.  Complains of some throbbing pain over the right dorsal foot and toe.  Has mild cough and shortness of breath.  Objective: Vitals:   06/17/21 1327 06/17/21 1420  BP: (!) 127/100 (!) 159/74  Pulse: 88 89  Resp: (!) 26 17  Temp: 97.8 F (36.6 C) 97.6 F (36.4 C)  SpO2: 93% 97%  Intake/Output Summary (Last 24 hours) at 06/17/2021 1510 Last data filed at 06/17/2021 0755 Gross per 24 hour  Intake 250 ml  Output --  Net 250 ml   Filed Weights   06/16/21 1726 06/17/21 0303  Weight: 111 kg 109.8 kg   Body mass index is 36.8 kg/m.   Physical Exam: GENERAL: Patient is alert awake and oriented. Not in obvious distress.  Obese, on nasal cannula oxygen HENT: No scleral pallor or icterus. Pupils equally reactive to light. Oral mucosa is moist NECK: is supple, no gross swelling noted. CHEST:  Diminished  breath sounds bilaterally.  Mild expiratory wheeze. CVS: S1 and S2 heard, no murmur. Regular rate and rhythm.  ABDOMEN: Soft, non-tender, bowel sounds are present. EXTREMITIES: Right foot with inflamed swollen great toe and erythema over the dorsum.  Mild bilateral lower extremity edema. CNS: Cranial nerves are intact. No focal motor deficits. SKIN: warm and dry without rashes.  Data Review: I have personally reviewed the following laboratory data and studies,  CBC: Recent Labs  Lab 06/16/21 1829 06/17/21 1146  WBC 6.5 7.9  NEUTROABS 3.5  --   HGB 15.4 14.8  HCT 46.6 44.7  MCV 88.8 88.5  PLT 233 732   Basic Metabolic Panel: Recent Labs  Lab 06/16/21 1829 06/17/21 1146  NA 138 138  K 3.6 3.5  CL 105 103  CO2 23 28  GLUCOSE 107* 173*  BUN 30* 27*  CREATININE 1.19 1.15  CALCIUM 9.0 9.2  MG 2.1  --    Liver Function Tests: Recent Labs  Lab 06/16/21 1829  AST 19  ALT 28  ALKPHOS 95  BILITOT 0.7  PROT 7.2  ALBUMIN 4.4   Recent Labs  Lab 06/16/21 2200  LIPASE 43   No results for input(s): AMMONIA in the last 168 hours. Cardiac Enzymes: No results for input(s): CKTOTAL, CKMB, CKMBINDEX, TROPONINI in the last 168 hours. BNP (last 3 results) Recent Labs    05/04/21 1355 06/16/21 1829  BNP 12.1 65.8    ProBNP (last 3 results) No results for input(s): PROBNP in the last 8760 hours.  CBG: No results for input(s): GLUCAP in the last 168 hours. Recent Results (from the past 240 hour(s))  Resp Panel by RT-PCR (Flu A&B, Covid) Nasopharyngeal Swab     Status: None   Collection Time: 06/16/21  6:30 PM   Specimen: Nasopharyngeal Swab; Nasopharyngeal(NP) swabs in vial transport medium  Result Value Ref Range Status   SARS Coronavirus 2 by RT PCR NEGATIVE NEGATIVE Final    Comment: (NOTE) SARS-CoV-2 target nucleic acids are NOT DETECTED.  The SARS-CoV-2 RNA is generally detectable in upper respiratory specimens during the acute phase of infection. The  lowest concentration of SARS-CoV-2 viral copies this assay can detect is 138 copies/mL. A negative result does not preclude SARS-Cov-2 infection and should not be used as the sole basis for treatment or other patient management decisions. A negative result may occur with  improper specimen collection/handling, submission of specimen other than nasopharyngeal swab, presence of viral mutation(s) within the areas targeted by this assay, and inadequate number of viral copies(<138 copies/mL). A negative result must be combined with clinical observations, patient history, and epidemiological information. The expected result is Negative.  Fact Sheet for Patients:  EntrepreneurPulse.com.au  Fact Sheet for Healthcare Providers:  IncredibleEmployment.be  This test is no t yet approved or cleared by the [place] FDA and  has been authorized for detection and/or diagnosis of SARS-CoV-2 by FDA under an Emergency  Use Authorization (EUA). This EUA will remain  in effect (meaning this test can be used) for the duration of the COVID-19 declaration under Section 564(b)(1) of the Act, 21 U.S.C.section 360bbb-3(b)(1), unless the authorization is terminated  or revoked sooner.       Influenza A by PCR NEGATIVE NEGATIVE Final   Influenza B by PCR NEGATIVE NEGATIVE Final    Comment: (NOTE) The Xpert Xpress SARS-CoV-2/FLU/RSV plus assay is intended as an aid in the diagnosis of influenza from Nasopharyngeal swab specimens and should not be used as a sole basis for treatment. Nasal washings and aspirates are unacceptable for Xpert Xpress SARS-CoV-2/FLU/RSV testing.  Fact Sheet for Patients: EntrepreneurPulse.com.au  Fact Sheet for Healthcare Providers: IncredibleEmployment.be  This test is not yet approved or cleared by the [place] FDA and has been authorized for detection and/or diagnosis of SARS-CoV-2 by FDA under  an Emergency Use Authorization (EUA). This EUA will remain in effect (meaning this test can be used) for the duration of the COVID-19 declaration under Section 564(b)(1) of the Act, 21 U.S.C. section 360bbb-3(b)(1), unless the authorization is terminated or revoked.  Performed at [facility], [place] [address] [place], [place] [address]      Studies: DG Chest 2 View  Result Date: 06/16/2021 CLINICAL DATA:  Shortness of breath, weight gain, lower extremity swelling EXAM: CHEST - 2 VIEW COMPARISON:  05/04/2021 FINDINGS: Frontal and lateral views of the chest demonstrate a stable cardiac silhouette. Increased veiling opacity at the left lung base consistent with enlarging left pleural effusion and increasing left lower lobe consolidation. Right chest is clear. No pneumothorax. No acute bony abnormalities. Prior resorption or resection of the left posterior fourth rib. IMPRESSION: 1. Progressive left basilar consolidation and effusion. Electronically Signed   By: Randa Ngo M.D.   On: 06/16/2021 18:20   CT Abdomen Pelvis W Contrast  Result Date: 06/16/2021 CLINICAL DATA:  30 pound weight gain over 4 days, lower abdominal and leg swelling, abdominal pain EXAM: CT ABDOMEN AND PELVIS WITH CONTRAST TECHNIQUE: Multidetector CT imaging of the abdomen and pelvis was performed using the standard protocol following bolus administration of intravenous contrast. RADIATION DOSE REDUCTION: This exam was performed according to the departmental dose-optimization program which includes automated exposure control, adjustment of the mA and/or kV according to patient size and/or use of iterative reconstruction technique. CONTRAST:  161mL OMNIPAQUE IOHEXOL 300 MG/ML  SOLN COMPARISON:  None. FINDINGS: Lower chest: No acute pleural or parenchymal lung disease. Mild bibasilar subpleural scarring. Hepatobiliary: Diffuse hepatic steatosis. No focal liver abnormality. Gallbladder is unremarkable. Pancreas:  Unremarkable. No pancreatic ductal dilatation or surrounding inflammatory changes. Spleen: Normal in size without focal abnormality. Adrenals/Urinary Tract: Kidneys enhance normally and symmetrically. No urinary tract calculi or obstruction. The adrenals and bladder are unremarkable. Stomach/Bowel: No bowel obstruction or ileus. Minimal diverticulosis of the descending and sigmoid colon. No evidence of acute diverticulitis. Normal appendix right lower quadrant. No bowel wall thickening or inflammatory change. Vascular/Lymphatic: Aortic atherosclerosis. No enlarged abdominal or pelvic lymph nodes. Reproductive: Prostate is unremarkable. Other: No free fluid or free gas. Fat containing umbilical hernia. No bowel herniation. Musculoskeletal: No acute or destructive bony lesions. Reconstructed images demonstrate no additional findings. IMPRESSION: 1. Distal colonic diverticulosis without diverticulitis. 2. Fat containing umbilical hernia. 3. Hepatic steatosis. 4.  Aortic Atherosclerosis (ICD10-I70.0). Electronically Signed   By: Randa Ngo M.D.   On: 06/16/2021 23:03   DG Foot 2 Views Right  Result Date: 06/17/2021 CLINICAL DATA:  71 year old male  with swelling of the right foot for the past 5 days with burning sensation in the right foot. EXAM: RIGHT FOOT - 2 VIEW COMPARISON:  No priors. FINDINGS: Two views of the right foot demonstrate no acute displaced fracture, subluxation or dislocation. Small plantar calcaneal spur incidentally noted. Mild degenerative changes of osteoarthritis, most evident at the first MTP joint. IMPRESSION: 1. No acute radiographic abnormality of the right foot. Electronically Signed   By: Vinnie Langton M.D.   On: 06/17/2021 11:06      Flora Lipps, MD  [facility] 06/17/2021  If 7PM-7AM, please contact night-coverage

## 2021-06-18 DIAGNOSIS — M109 Gout, unspecified: Secondary | ICD-10-CM

## 2021-06-18 LAB — CBC
HCT: 44 % (ref 39.0–52.0)
Hemoglobin: 14.6 g/dL (ref 13.0–17.0)
MCH: 29.1 pg (ref 26.0–34.0)
MCHC: 33.2 g/dL (ref 30.0–36.0)
MCV: 87.6 fL (ref 80.0–100.0)
Platelets: 245 10*3/uL (ref 150–400)
RBC: 5.02 MIL/uL (ref 4.22–5.81)
RDW: 13.9 % (ref 11.5–15.5)
WBC: 15.9 10*3/uL — ABNORMAL HIGH (ref 4.0–10.5)
nRBC: 0 % (ref 0.0–0.2)

## 2021-06-18 LAB — MAGNESIUM: Magnesium: 1.9 mg/dL (ref 1.7–2.4)

## 2021-06-18 LAB — BASIC METABOLIC PANEL
Anion gap: 10 (ref 5–15)
BUN: 38 mg/dL — ABNORMAL HIGH (ref 8–23)
CO2: 23 mmol/L (ref 22–32)
Calcium: 9 mg/dL (ref 8.9–10.3)
Chloride: 104 mmol/L (ref 98–111)
Creatinine, Ser: 1.38 mg/dL — ABNORMAL HIGH (ref 0.61–1.24)
GFR, Estimated: 55 mL/min — ABNORMAL LOW (ref >60)
Glucose, Bld: 169 mg/dL — ABNORMAL HIGH (ref 70–99)
Potassium: 3.8 mmol/L (ref 3.5–5.1)
Sodium: 137 mmol/L (ref 135–145)

## 2021-06-18 LAB — PHOSPHORUS: Phosphorus: 3 mg/dL (ref 2.5–4.6)

## 2021-06-18 MED ORDER — COLCHICINE 0.3 MG HALF TABLET
0.3000 mg | ORAL_TABLET | Freq: Two times a day (BID) | ORAL | Status: DC
  Administered 2021-06-18 – 2021-06-20 (×5): 0.3 mg via ORAL
  Filled 2021-06-18 (×5): qty 1

## 2021-06-18 MED ORDER — METOLAZONE 5 MG PO TABS
5.0000 mg | ORAL_TABLET | Freq: Once | ORAL | Status: AC
  Administered 2021-06-18: 5 mg via ORAL
  Filled 2021-06-18: qty 1

## 2021-06-18 MED ORDER — FUROSEMIDE 10 MG/ML IJ SOLN
40.0000 mg | Freq: Two times a day (BID) | INTRAMUSCULAR | Status: DC
  Administered 2021-06-18 – 2021-06-20 (×4): 40 mg via INTRAVENOUS
  Filled 2021-06-18 (×5): qty 4

## 2021-06-18 NOTE — Progress Notes (Signed)
PROGRESS NOTE  Randall Montoya OIN:[id number] DOB: 06/24/1950 DOA: 06/16/2021 PCP: Mardi Mainland, FNP   LOS: 1 day   Brief narrative:  Randall Montoya is a pleasant 71 y.o. male with past medical history of COPD, obstructive sleep apnea, heart failure with preserved ejection fraction, coronary artery disease and hypertension presented hospital with worsening shortness of breath weight gain and productive cough.  In the ED, patient was saturating in the mid 80s on room air.  Chest x-ray showed progressive left basilar opacity.  CT scan of the abdomen pelvis was negative for acute findings.  Troponin was negative.  Patient was given 80 mg IV Lasix, albuterol and was admitted to hospital for further evaluation and treatment.  Assessment/Plan:  Principal Problem:   COPD with acute exacerbation (HCC) Active Problems:   Chronic diastolic CHF (congestive heart failure) (HCC)   Hypertension   OSA on CPAP   CAD (coronary artery disease)   Acute exacerbation of chronic obstructive pulmonary disease (COPD) ([place])   Acute COPD exacerbation  Patient feels improved with nebulizer and steroids.  Requesting for nebulizer machine at home.  We will put a DME order for nebulizer.  Continue with bronchodilators and  home inhalers..  On IV Solu-Medrol twice daily.  Want to change to oral prednisone on discharge.   Acute on chronic diastolic CHF  Patient received IV diuretic in the ED with significant diuresis.  2D echocardiogram showed LV ejection fraction of 60 to 65%.  Patient had been on Lasix 80 in the morning and 40 at nighttime at home.  Continue aspirin and Lipitor.  Continue with IV Lasix 40 twice daily.  We will add Zaroxolyn 5 mg x1, continue strict intake and output charting Daily weights.  Continue to monitor creatinine.    CAD status post stent in 2013 Continue antiplatelets with aspirin Plavix, statin and beta-blocker.  No active issues.  No chest pain.  Right dorsal foot erythema with  swelling of the great toe likely from acute gouty arthritis.  Significant improvement compared to yesterday.  Possibility of gout.  X-ray of the right foot with no acute bony abnormality.  Uric acid elevated at 11.3.  Continue IV steroids.  Renal function is stable.  Patient received 1 dose of colchicine yesterday.  Will start on colchicine 0.3 twice daily for now.  Will reassess in AM.    OSA  Continue CPAP at bedtime..  DVT prophylaxis: enoxaparin (LOVENOX) injection 40 mg Start: 06/17/21 1000   Code Status: Full code  Family Communication:   Spoke with the patient's sister on the phone on 06/17/2021   Status is: Inpatient  The patient is  inpatient because: Decompensated heart failure, IV diuresis, COPD exacerbation, acute gouty arthritis  Consultants: None  Procedures: None  Anti-infectives:  Azithromycin  Anti-infectives (From admission, onward)    Start     Dose/Rate Route Frequency Ordered Stop   06/18/21 0530  azithromycin (ZITHROMAX) tablet 500 mg       See Hyperspace for full Linked Orders Report.   500 mg Oral Daily 06/17/21 0521 06/22/21 0959   06/17/21 0530  azithromycin (ZITHROMAX) 500 mg in sodium chloride 0.9 % 250 mL IVPB       See Hyperspace for full Linked Orders Report.   500 mg 250 mL/hr over 60 Minutes Intravenous Every 24 hours 06/17/21 0521 06/17/21 0755      Subjective: Today, patient was seen and examined at bedside.  Patient states that that his pain in the foot has improved including  redness.  Feels better with breathing especially when he gets his nebulizer treatment.  Has less cough.  Denies any chest pain.   Objective: Vitals:   06/18/21 0439 06/18/21 0820  BP: (!) 153/79   Pulse: 87   Resp: 20   Temp: 98.2 F (36.8 C)   SpO2: 98% 95%    Intake/Output Summary (Last 24 hours) at 06/18/2021 7654 Last data filed at 06/17/2021 2154 Gross per 24 hour  Intake 480 ml  Output 1300 ml  Net -820 ml    Filed Weights   06/16/21 1726  06/17/21 0303 06/18/21 0451  Weight: 111 kg 109.8 kg 108.4 kg   Body mass index is 36.34 kg/m.   Physical Exam:  General: Obese built, not in obvious distress, on nasal cannula oxygen HENT:   No scleral pallor or icterus noted. Oral mucosa is moist.  Chest:    Diminished breath sounds bilaterally, mild expiratory wheezes noted CVS: S1 &S2 heard. No murmur.  Regular rate and rhythm. Abdomen: Soft, nontender, nondistended.  Bowel sounds are heard.   Extremities: No cyanosis, clubbing with lower extremity edema.  Right great toe and dorsum with erythema which has improved including tenderness, peripheral pulses are palpable. Psych: Alert, awake and oriented, normal mood CNS:  No cranial nerve deficits.  Power equal in all extremities.   Skin: Warm and dry.  Mild erythema over the right foot  Data Review: I have personally reviewed the following laboratory data and studies,  CBC: Recent Labs  Lab 06/16/21 1829 06/17/21 1146 06/18/21 0535  WBC 6.5 7.9 15.9*  NEUTROABS 3.5  --   --   HGB 15.4 14.8 14.6  HCT 46.6 44.7 44.0  MCV 88.8 88.5 87.6  PLT 233 213 650    Basic Metabolic Panel: Recent Labs  Lab 06/16/21 1829 06/17/21 1146 06/18/21 0535  NA 138 138 137  K 3.6 3.5 3.8  CL 105 103 104  CO2 23 28 23   GLUCOSE 107* 173* 169*  BUN 30* 27* 38*  CREATININE 1.19 1.15 1.38*  CALCIUM 9.0 9.2 9.0  MG 2.1  --  1.9  PHOS  --   --  3.0    Liver Function Tests: Recent Labs  Lab 06/16/21 1829  AST 19  ALT 28  ALKPHOS 95  BILITOT 0.7  PROT 7.2  ALBUMIN 4.4    Recent Labs  Lab 06/16/21 2200  LIPASE 43    No results for input(s): AMMONIA in the last 168 hours. Cardiac Enzymes: No results for input(s): CKTOTAL, CKMB, CKMBINDEX, TROPONINI in the last 168 hours. BNP (last 3 results) Recent Labs    05/04/21 1355 06/16/21 1829  BNP 12.1 65.8     ProBNP (last 3 results) No results for input(s): PROBNP in the last 8760 hours.  CBG: No results for input(s):  GLUCAP in the last 168 hours. Recent Results (from the past 240 hour(s))  Resp Panel by RT-PCR (Flu A&B, Covid) Nasopharyngeal Swab     Status: None   Collection Time: 06/16/21  6:30 PM   Specimen: Nasopharyngeal Swab; Nasopharyngeal(NP) swabs in vial transport medium  Result Value Ref Range Status   SARS Coronavirus 2 by RT PCR NEGATIVE NEGATIVE Final    Comment: (NOTE) SARS-CoV-2 target nucleic acids are NOT DETECTED.  The SARS-CoV-2 RNA is generally detectable in upper respiratory specimens during the acute phase of infection. The lowest concentration of SARS-CoV-2 viral copies this assay can detect is 138 copies/mL. A negative result does not preclude SARS-Cov-2 infection and  should not be used as the sole basis for treatment or other patient management decisions. A negative result may occur with  improper specimen collection/handling, submission of specimen other than nasopharyngeal swab, presence of viral mutation(s) within the areas targeted by this assay, and inadequate number of viral copies(<138 copies/mL). A negative result must be combined with clinical observations, patient history, and epidemiological information. The expected result is Negative.  Fact Sheet for Patients:  EntrepreneurPulse.com.au  Fact Sheet for Healthcare Providers:  IncredibleEmployment.be  This test is no t yet approved or cleared by the [place] FDA and  has been authorized for detection and/or diagnosis of SARS-CoV-2 by FDA under an Emergency Use Authorization (EUA). This EUA will remain  in effect (meaning this test can be used) for the duration of the COVID-19 declaration under Section 564(b)(1) of the Act, 21 U.S.C.section 360bbb-3(b)(1), unless the authorization is terminated  or revoked sooner.       Influenza A by PCR NEGATIVE NEGATIVE Final   Influenza B by PCR NEGATIVE NEGATIVE Final    Comment: (NOTE) The Xpert Xpress SARS-CoV-2/FLU/RSV  plus assay is intended as an aid in the diagnosis of influenza from Nasopharyngeal swab specimens and should not be used as a sole basis for treatment. Nasal washings and aspirates are unacceptable for Xpert Xpress SARS-CoV-2/FLU/RSV testing.  Fact Sheet for Patients: EntrepreneurPulse.com.au  Fact Sheet for Healthcare Providers: IncredibleEmployment.be  This test is not yet approved or cleared by the [place] FDA and has been authorized for detection and/or diagnosis of SARS-CoV-2 by FDA under an Emergency Use Authorization (EUA). This EUA will remain in effect (meaning this test can be used) for the duration of the COVID-19 declaration under Section 564(b)(1) of the Act, 21 U.S.C. section 360bbb-3(b)(1), unless the authorization is terminated or revoked.  Performed at [facility], [place] [address], [place],  [address]       Studies: DG Chest 2 View  Result Date: 06/16/2021 CLINICAL DATA:  Shortness of breath, weight gain, lower extremity swelling EXAM: CHEST - 2 VIEW COMPARISON:  05/04/2021 FINDINGS: Frontal and lateral views of the chest demonstrate a stable cardiac silhouette. Increased veiling opacity at the left lung base consistent with enlarging left pleural effusion and increasing left lower lobe consolidation. Right chest is clear. No pneumothorax. No acute bony abnormalities. Prior resorption or resection of the left posterior fourth rib. IMPRESSION: 1. Progressive left basilar consolidation and effusion. Electronically Signed   By: Randa Ngo M.D.   On: 06/16/2021 18:20   CT Abdomen Pelvis W Contrast  Result Date: 06/16/2021 CLINICAL DATA:  30 pound weight gain over 4 days, lower abdominal and leg swelling, abdominal pain EXAM: CT ABDOMEN AND PELVIS WITH CONTRAST TECHNIQUE: Multidetector CT imaging of the abdomen and pelvis was performed using the standard protocol following bolus administration of  intravenous contrast. RADIATION DOSE REDUCTION: This exam was performed according to the departmental dose-optimization program which includes automated exposure control, adjustment of the mA and/or kV according to patient size and/or use of iterative reconstruction technique. CONTRAST:  144mL OMNIPAQUE IOHEXOL 300 MG/ML  SOLN COMPARISON:  None. FINDINGS: Lower chest: No acute pleural or parenchymal lung disease. Mild bibasilar subpleural scarring. Hepatobiliary: Diffuse hepatic steatosis. No focal liver abnormality. Gallbladder is unremarkable. Pancreas: Unremarkable. No pancreatic ductal dilatation or surrounding inflammatory changes. Spleen: Normal in size without focal abnormality. Adrenals/Urinary Tract: Kidneys enhance normally and symmetrically. No urinary tract calculi or obstruction. The adrenals and bladder are unremarkable. Stomach/Bowel: No bowel obstruction or ileus.  Minimal diverticulosis of the descending and sigmoid colon. No evidence of acute diverticulitis. Normal appendix right lower quadrant. No bowel wall thickening or inflammatory change. Vascular/Lymphatic: Aortic atherosclerosis. No enlarged abdominal or pelvic lymph nodes. Reproductive: Prostate is unremarkable. Other: No free fluid or free gas. Fat containing umbilical hernia. No bowel herniation. Musculoskeletal: No acute or destructive bony lesions. Reconstructed images demonstrate no additional findings. IMPRESSION: 1. Distal colonic diverticulosis without diverticulitis. 2. Fat containing umbilical hernia. 3. Hepatic steatosis. 4.  Aortic Atherosclerosis (ICD10-I70.0). Electronically Signed   By: Randa Ngo M.D.   On: 06/16/2021 23:03   DG Foot 2 Views Right  Result Date: 06/17/2021 CLINICAL DATA:  71 year old male with swelling of the right foot for the past 5 days with burning sensation in the right foot. EXAM: RIGHT FOOT - 2 VIEW COMPARISON:  No priors. FINDINGS: Two views of the right foot demonstrate no acute displaced  fracture, subluxation or dislocation. Small plantar calcaneal spur incidentally noted. Mild degenerative changes of osteoarthritis, most evident at the first MTP joint. IMPRESSION: 1. No acute radiographic abnormality of the right foot. Electronically Signed   By: Vinnie Langton M.D.   On: 06/17/2021 11:06      Flora Lipps, MD  [facility] 06/18/2021  If 7PM-7AM, please contact night-coverage

## 2021-06-18 NOTE — Plan of Care (Signed)
°  Problem: Education: Goal: Knowledge of disease or condition will improve Outcome: Progressing Goal: Knowledge of the prescribed therapeutic regimen will improve Outcome: Progressing Goal: Individualized Educational Video(s) Outcome: Progressing   Problem: Activity: Goal: Ability to tolerate increased activity will improve Outcome: Progressing Goal: Will verbalize the importance of balancing activity with adequate rest periods Outcome: Progressing   Problem: Respiratory: Goal: Ability to maintain a clear airway will improve Outcome: Progressing Goal: Levels of oxygenation will improve Outcome: Progressing Goal: Ability to maintain adequate ventilation will improve Outcome: Progressing   Problem: Education: Goal: Knowledge of General Education information will improve Description: Including pain rating scale, medication(s)/side effects and non-pharmacologic comfort measures Outcome: Progressing   Problem: Health Behavior/Discharge Planning: Goal: Ability to manage health-related needs will improve Outcome: Progressing   Problem: Clinical Measurements: Goal: Ability to maintain clinical measurements within normal limits will improve Outcome: Progressing Goal: Will remain free from infection Outcome: Progressing Goal: Diagnostic test results will improve Outcome: Progressing Goal: Respiratory complications will improve Outcome: Progressing Goal: Cardiovascular complication will be avoided Outcome: Progressing   Problem: Activity: Goal: Risk for activity intolerance will decrease Outcome: Progressing   Problem: Nutrition: Goal: Adequate nutrition will be maintained Outcome: Progressing   Problem: Coping: Goal: Level of anxiety will decrease Outcome: Progressing   Problem: Elimination: Goal: Will not experience complications related to bowel motility Outcome: Progressing Goal: Will not experience complications related to urinary retention Outcome: Progressing    Problem: Pain Managment: Goal: General experience of comfort will improve Outcome: Progressing

## 2021-06-19 LAB — CBC
HCT: 42.8 % (ref 39.0–52.0)
Hemoglobin: 14.3 g/dL (ref 13.0–17.0)
MCH: 29.2 pg (ref 26.0–34.0)
MCHC: 33.4 g/dL (ref 30.0–36.0)
MCV: 87.5 fL (ref 80.0–100.0)
Platelets: 257 10*3/uL (ref 150–400)
RBC: 4.89 MIL/uL (ref 4.22–5.81)
RDW: 14.2 % (ref 11.5–15.5)
WBC: 16.1 10*3/uL — ABNORMAL HIGH (ref 4.0–10.5)
nRBC: 0 % (ref 0.0–0.2)

## 2021-06-19 LAB — GLUCOSE, CAPILLARY
Glucose-Capillary: 177 mg/dL — ABNORMAL HIGH (ref 70–99)
Glucose-Capillary: 244 mg/dL — ABNORMAL HIGH (ref 70–99)
Glucose-Capillary: 206 mg/dL — ABNORMAL HIGH (ref 70–99)

## 2021-06-19 LAB — BASIC METABOLIC PANEL
Anion gap: 10 (ref 5–15)
BUN: 48 mg/dL — ABNORMAL HIGH (ref 8–23)
CO2: 26 mmol/L (ref 22–32)
Calcium: 9.3 mg/dL (ref 8.9–10.3)
Chloride: 99 mmol/L (ref 98–111)
Creatinine, Ser: 1.43 mg/dL — ABNORMAL HIGH (ref 0.61–1.24)
GFR, Estimated: 53 mL/min — ABNORMAL LOW (ref >60)
Glucose, Bld: 201 mg/dL — ABNORMAL HIGH (ref 70–99)
Potassium: 4.2 mmol/L (ref 3.5–5.1)
Sodium: 135 mmol/L (ref 135–145)

## 2021-06-19 LAB — HEMOGLOBIN A1C
Hgb A1c MFr Bld: 5.9 % — ABNORMAL HIGH (ref 4.8–5.6)
Mean Plasma Glucose: 122.63 mg/dL

## 2021-06-19 LAB — MAGNESIUM: Magnesium: 2.2 mg/dL (ref 1.7–2.4)

## 2021-06-19 MED ORDER — INSULIN ASPART 100 UNIT/ML IJ SOLN
0.0000 [IU] | Freq: Three times a day (TID) | INTRAMUSCULAR | Status: DC
  Administered 2021-06-19: 5 [IU] via SUBCUTANEOUS
  Administered 2021-06-19 – 2021-06-20 (×2): 3 [IU] via SUBCUTANEOUS

## 2021-06-19 MED ORDER — ERYTHROMYCIN 5 MG/GM OP OINT
TOPICAL_OINTMENT | Freq: Three times a day (TID) | OPHTHALMIC | Status: DC
  Filled 2021-06-19: qty 3.5

## 2021-06-19 MED ORDER — INSULIN ASPART 100 UNIT/ML IJ SOLN
0.0000 [IU] | Freq: Every day | INTRAMUSCULAR | Status: DC
  Administered 2021-06-19: 2 [IU] via SUBCUTANEOUS

## 2021-06-19 MED ORDER — METOLAZONE 2.5 MG PO TABS
2.5000 mg | ORAL_TABLET | Freq: Once | ORAL | Status: AC
  Administered 2021-06-19: 2.5 mg via ORAL
  Filled 2021-06-19: qty 1

## 2021-06-19 NOTE — Progress Notes (Addendum)
PROGRESS NOTE  Randall Montoya:[id number] DOB: 07/16/1950 DOA: 06/16/2021 PCP: Mardi Mainland, FNP   LOS: 2 days   Brief narrative: Randall Montoya is a pleasant 71 y.o. male with past medical history of COPD, obstructive sleep apnea, heart failure with preserved ejection fraction, coronary artery disease and hypertension presented hospital with worsening shortness of breath weight gain and productive cough.  In the ED, patient was saturating in the mid 80s on room air.  Chest x-ray showed progressive left basilar opacity.  CT scan of the abdomen pelvis was negative for acute findings.  Troponin was negative.  Patient was given 80 mg IV Lasix, albuterol and was admitted to hospital for further evaluation and treatment.  Assessment/Plan:  Principal Problem:   COPD with acute exacerbation (HCC) Active Problems:   Chronic diastolic CHF (congestive heart failure) (HCC)   Hypertension   OSA on CPAP   CAD (coronary artery disease)   Acute exacerbation of chronic obstructive pulmonary disease (COPD) ([place])   Acute COPD exacerbation  Continues to feel improved with the bronchodilators nebulizers.  Requesting for nebulizer machine at home.  Me order has been placed in.  Continue with bronchodilators and  home inhalers..  On IV Solu-Medrol twice daily.  Continue oral prednisone on discharge.   Acute on chronic diastolic CHF  Patient received IV diuretic in the ED with significant diuresis.  2D echocardiogram showed LV ejection fraction of 60 to 65%.  Patient had been on Lasix 80 in the morning and 40 at nighttime at home.  Continue aspirin and Lipitor.  Continue with IV Lasix 40 twice daily.  Received Zaroxolyn 5 mg x1 yesterday.  We will give additional dose today.  Continue strict intake and output charting Daily weights.  Continue to monitor creatinine.  Patient is negative balance for around 2048 mL with 8 pound weight loss.  We will continue to monitor closely.  Spoke with the patient  regarding a low-salt diet, fluid restriction Daily weights.  Would benefit from ongoing CHF education.  Will encourage ambulation today    CAD status post stent in 2013 Continue antiplatelets with aspirin Plavix, statin and beta-blocker.  No active issues.  No chest pain.  Right dorsal foot erythema with swelling of the great toe likely from acute gouty arthritis.  Continues to improve.   X-ray of the right foot with no acute bony abnormality.  Uric acid was elevated at 11.3.  Continue IV steroids.  Renal function is stable.  Continue colchicine 0.3 twice daily for now.      OSA  Continue CPAP at bedtime.  Hyperglycemia.  Likely steroid-induced.  We will add sliding scale insulin.  Disposition.  Likely home tomorrow if continues to improve.  Continue diuresis.  DVT prophylaxis: enoxaparin (LOVENOX) injection 40 mg Start: 06/17/21 1000   Code Status: Full code  Family Communication:   Spoke with the patient's sister on the phone on 06/17/2021   Status is: Inpatient  The patient is  inpatient because: Decompensated heart failure, IV diuresis, COPD exacerbation, acute gouty arthritis  Consultants: None  Procedures: None  Anti-infectives:  Azithromycin  Anti-infectives (From admission, onward)    Start     Dose/Rate Route Frequency Ordered Stop   06/18/21 0530  azithromycin (ZITHROMAX) tablet 500 mg       See Hyperspace for full Linked Orders Report.   500 mg Oral Daily 06/17/21 0521 06/22/21 0959   06/17/21 0530  azithromycin (ZITHROMAX) 500 mg in sodium chloride 0.9 % 250 mL IVPB  See Hyperspace for full Linked Orders Report.   500 mg 250 mL/hr over 60 Minutes Intravenous Every 24 hours 06/17/21 0521 06/17/21 0755      Subjective: Today, patient was seen and examined after.  Continues to feel better with his foot with decreased redness.  Breathing continues to improve.  Denies any chest pain but still has some chest congestion.    Objective: Vitals:   06/19/21  0824 06/19/21 0903  BP:  (!) 151/72  Pulse:  85  Resp:    Temp:    SpO2: 95%     Intake/Output Summary (Last 24 hours) at 06/19/2021 1008 Last data filed at 06/19/2021 0917 Gross per 24 hour  Intake 1485 ml  Output 2900 ml  Net -1415 ml    Filed Weights   06/17/21 0303 06/18/21 0451 06/19/21 0355  Weight: 109.8 kg 108.4 kg 107.4 kg   Body mass index is 36 kg/m.   Physical Exam:  General: Obese built, not in obvious distress room air HENT:   No scleral pallor or icterus noted. Oral mucosa is moist.  Chest:    Diminished breath sounds bilaterally.  Coarse breath sounds noted. CVS: S1 &S2 heard. No murmur.  Regular rate and rhythm. Abdomen: Soft, nontender, nondistended.  Bowel sounds are heard.   Extremities: No cyanosis, clubbing or edema.  Peripheral pulses are palpable.  Right foot with erythema edema of the great toe with tenderness and dorsal erythema which is significantly improved Psych: Alert, awake and oriented, normal mood CNS:  No cranial nerve deficits.  Power equal in all extremities.   Skin: Warm and dry.  Mild erythema of the right foot.   Data Review: I have personally reviewed the following laboratory data and studies,  CBC: Recent Labs  Lab 06/16/21 1829 06/17/21 1146 06/18/21 0535 06/19/21 0451  WBC 6.5 7.9 15.9* 16.1*  NEUTROABS 3.5  --   --   --   HGB 15.4 14.8 14.6 14.3  HCT 46.6 44.7 44.0 42.8  MCV 88.8 88.5 87.6 87.5  PLT 233 213 245 893    Basic Metabolic Panel: Recent Labs  Lab 06/16/21 1829 06/17/21 1146 06/18/21 0535 06/19/21 0451  NA 138 138 137 135  K 3.6 3.5 3.8 4.2  CL 105 103 104 99  CO2 23 28 23 26   GLUCOSE 107* 173* 169* 201*  BUN 30* 27* 38* 48*  CREATININE 1.19 1.15 1.38* 1.43*  CALCIUM 9.0 9.2 9.0 9.3  MG 2.1  --  1.9 2.2  PHOS  --   --  3.0  --     Liver Function Tests: Recent Labs  Lab 06/16/21 1829  AST 19  ALT 28  ALKPHOS 95  BILITOT 0.7  PROT 7.2  ALBUMIN 4.4    Recent Labs  Lab 06/16/21 2200   LIPASE 43    No results for input(s): AMMONIA in the last 168 hours. Cardiac Enzymes: No results for input(s): CKTOTAL, CKMB, CKMBINDEX, TROPONINI in the last 168 hours. BNP (last 3 results) Recent Labs    05/04/21 1355 06/16/21 1829  BNP 12.1 65.8     ProBNP (last 3 results) No results for input(s): PROBNP in the last 8760 hours.  CBG: No results for input(s): GLUCAP in the last 168 hours. Recent Results (from the past 240 hour(s))  Resp Panel by RT-PCR (Flu A&B, Covid) Nasopharyngeal Swab     Status: None   Collection Time: 06/16/21  6:30 PM   Specimen: Nasopharyngeal Swab; Nasopharyngeal(NP) swabs in vial transport medium  Result Value Ref Range Status   SARS Coronavirus 2 by RT PCR NEGATIVE NEGATIVE Final    Comment: (NOTE) SARS-CoV-2 target nucleic acids are NOT DETECTED.  The SARS-CoV-2 RNA is generally detectable in upper respiratory specimens during the acute phase of infection. The lowest concentration of SARS-CoV-2 viral copies this assay can detect is 138 copies/mL. A negative result does not preclude SARS-Cov-2 infection and should not be used as the sole basis for treatment or other patient management decisions. A negative result may occur with  improper specimen collection/handling, submission of specimen other than nasopharyngeal swab, presence of viral mutation(s) within the areas targeted by this assay, and inadequate number of viral copies(<138 copies/mL). A negative result must be combined with clinical observations, patient history, and epidemiological information. The expected result is Negative.  Fact Sheet for Patients:  EntrepreneurPulse.com.au  Fact Sheet for Healthcare Providers:  IncredibleEmployment.be  This test is no t yet approved or cleared by the [place] FDA and  has been authorized for detection and/or diagnosis of SARS-CoV-2 by FDA under an Emergency Use Authorization (EUA). This EUA will  remain  in effect (meaning this test can be used) for the duration of the COVID-19 declaration under Section 564(b)(1) of the Act, 21 U.S.C.section 360bbb-3(b)(1), unless the authorization is terminated  or revoked sooner.       Influenza A by PCR NEGATIVE NEGATIVE Final   Influenza B by PCR NEGATIVE NEGATIVE Final    Comment: (NOTE) The Xpert Xpress SARS-CoV-2/FLU/RSV plus assay is intended as an aid in the diagnosis of influenza from Nasopharyngeal swab specimens and should not be used as a sole basis for treatment. Nasal washings and aspirates are unacceptable for Xpert Xpress SARS-CoV-2/FLU/RSV testing.  Fact Sheet for Patients: EntrepreneurPulse.com.au  Fact Sheet for Healthcare Providers: IncredibleEmployment.be  This test is not yet approved or cleared by the [place] FDA and has been authorized for detection and/or diagnosis of SARS-CoV-2 by FDA under an Emergency Use Authorization (EUA). This EUA will remain in effect (meaning this test can be used) for the duration of the COVID-19 declaration under Section 564(b)(1) of the Act, 21 U.S.C. section 360bbb-3(b)(1), unless the authorization is terminated or revoked.  Performed at [facility], [place] [address] [place], [place] [address]       Studies: DG Foot 2 Views Right  Result Date: 06/17/2021 CLINICAL DATA:  71 year old male with swelling of the right foot for the past 5 days with burning sensation in the right foot. EXAM: RIGHT FOOT - 2 VIEW COMPARISON:  No priors. FINDINGS: Two views of the right foot demonstrate no acute displaced fracture, subluxation or dislocation. Small plantar calcaneal spur incidentally noted. Mild degenerative changes of osteoarthritis, most evident at the first MTP joint. IMPRESSION: 1. No acute radiographic abnormality of the right foot. Electronically Signed   By: Vinnie Langton M.D.   On: 06/17/2021 11:06      Flora Lipps, MD  [facility] 06/19/2021  If 7PM-7AM, please contact night-coverage

## 2021-06-19 NOTE — Progress Notes (Signed)
Pt to place cpap on himself.

## 2021-06-19 NOTE — TOC Transition Note (Signed)
Transition of Care [facility]) - CM/SW Discharge Note   Patient Details  Name: Randall Montoya MRN: [id number] Date of Birth: July 27, 1950  [facility] CM/SW Contact:  Trish Mage, LCSW Phone Number: 06/19/2021, 10:21 AM   Clinical Narrative:  Ordered nebulizer for patient from [place] at [place].      Final next level of care: Home/Self Care Barriers to Discharge: No Barriers Identified   Patient Goals and CMS Choice        Discharge Placement                       Discharge Plan and Services                                     Social Determinants of Health (SDOH) Interventions     Readmission Risk Interventions No flowsheet data found.

## 2021-06-20 LAB — BASIC METABOLIC PANEL
Anion gap: 11 (ref 5–15)
BUN: 64 mg/dL — ABNORMAL HIGH (ref 8–23)
CO2: 28 mmol/L (ref 22–32)
Calcium: 9.4 mg/dL (ref 8.9–10.3)
Chloride: 95 mmol/L — ABNORMAL LOW (ref 98–111)
Creatinine, Ser: 1.56 mg/dL — ABNORMAL HIGH (ref 0.61–1.24)
GFR, Estimated: 48 mL/min — ABNORMAL LOW (ref >60)
Glucose, Bld: 175 mg/dL — ABNORMAL HIGH (ref 70–99)
Potassium: 4.2 mmol/L (ref 3.5–5.1)
Sodium: 134 mmol/L — ABNORMAL LOW (ref 135–145)

## 2021-06-20 LAB — CBC
HCT: 46.4 % (ref 39.0–52.0)
Hemoglobin: 15.7 g/dL (ref 13.0–17.0)
MCH: 29.2 pg (ref 26.0–34.0)
MCHC: 33.8 g/dL (ref 30.0–36.0)
MCV: 86.4 fL (ref 80.0–100.0)
Platelets: 290 10*3/uL (ref 150–400)
RBC: 5.37 MIL/uL (ref 4.22–5.81)
RDW: 14.1 % (ref 11.5–15.5)
WBC: 15.5 10*3/uL — ABNORMAL HIGH (ref 4.0–10.5)
nRBC: 0 % (ref 0.0–0.2)

## 2021-06-20 LAB — GLUCOSE, CAPILLARY: Glucose-Capillary: 161 mg/dL — ABNORMAL HIGH (ref 70–99)

## 2021-06-20 LAB — MAGNESIUM: Magnesium: 2.5 mg/dL — ABNORMAL HIGH (ref 1.7–2.4)

## 2021-06-20 MED ORDER — ALBUTEROL SULFATE (2.5 MG/3ML) 0.083% IN NEBU: 2.5000 mg | INHALATION_SOLUTION | RESPIRATORY_TRACT | 2 refills | Status: AC | PRN

## 2021-06-20 MED ORDER — PREDNISONE 10 MG PO TABS: ORAL_TABLET | ORAL | 0 refills | Status: DC

## 2021-06-20 MED ORDER — COLCHICINE 0.6 MG PO TABS: 0.6000 mg | ORAL_TABLET | Freq: Every day | ORAL | 1 refills | Status: AC

## 2021-06-20 NOTE — Progress Notes (Signed)
Went over discharge instructions w/ pt. Reinforced importance of reading food labels and keeping his sodium intake low and to keep his fluid intake to 1500 mL per day. Pt. Verbalized understanding.

## 2021-06-20 NOTE — Discharge Summary (Signed)
Physician Discharge Summary  Randall Montoya YBO:[id number] DOB: 1950-08-20 DOA: 06/16/2021  PCP: Mardi Mainland, FNP  Admit date: 06/16/2021 Discharge date: 06/20/2021  Admitted From: Home  Discharge disposition: Home  Recommendations for Outpatient Follow-Up:   Follow up with your primary care provider in one week.  Please follow-up for acute gouty attack might benefit from long-term maintenance treatment. Check CBC, BMP, magnesium in the next visit  Discharge Diagnosis:   Principal Problem:   COPD with acute exacerbation ([place]) Active Problems:   Chronic diastolic CHF (congestive heart failure) ([facility])   Hypertension   OSA on CPAP   CAD (coronary artery disease)   Acute exacerbation of chronic obstructive pulmonary disease (COPD) ([place])   Discharge Condition: Improved.  Diet recommendation: Low sodium, heart healthy.    Wound care: None.  Code status: Full.   History of Present Illness:   Randall Montoya is a pleasant 71 y.o. male with past medical history of COPD, obstructive sleep apnea, heart failure with preserved ejection fraction, coronary artery disease and hypertension presented hospital with worsening shortness of breath weight gain and productive cough.  In the ED, patient was saturating in the mid 80s on room air.  Chest x-ray showed progressive left basilar opacity.  CT scan of the abdomen pelvis was negative for acute findings.  Troponin was negative.  Patient was given 80 mg IV Lasix, albuterol and was admitted to hospital for further evaluation and treatment.   Hospital Course:   Following conditions were addressed during hospitalization as listed below,  Acute COPD exacerbation  Improved with bronchodilators nebulizer IV steroids.  Will continue with the prednisone taper on discharge.  Nebulizers has been prescribed as well.  Continue Trelegy Ellipta from home   Acute on chronic diastolic CHF  Patient received IV diuretic in the ED with significant  diuresis.  2D echocardiogram showed LV ejection fraction of 60 to 65%.  Patient had been on Lasix 80 in the morning and 40 at nighttime at home.  This will be resumed on discharge.  Continue aspirin and Lipitor.  Patient had 14 pounds weight loss prior to discharge moderate diuresis and appears compensated at this time.  CHF education was given to the patient including fluid restriction and daily weights at home including low-salt diet.   CAD status post stent in 2013 Continue antiplatelets with aspirin Plavix, statin and beta-blocker.  No active issues.  Patient did not have chest pain.  Right dorsal foot erythema with swelling of the great toe likely from acute gouty arthritis.  X-ray of foot without any abnormality.  Uric acid was elevated at 11.3.  Patient was started on colchicine while in the hospital which will be continued.  Will be given prednisone taper on discharge as well.    OSA  Continue CPAP at bedtime.   Hyperglycemia.  Likely steroid-induced.  On tapering steroids on discharge.  Disposition.  At this time, patient is stable for disposition home with outpatient PCP follow-up.  Medical Consultants:   None.  Procedures:    None Subjective:   Today, patient was seen and examined at bedside.  Continues to feel better with breathing.  Denies any chest pain dizziness lightheadedness.  Has been urinating well.  Discharge Exam:   Vitals:   06/19/21 2012 06/20/21 0435  BP: 132/83 (!) (P) 142/88  Pulse: 91 (P) 74  Resp: 20 (P) 20  Temp: (!) 97.5 F (36.4 C) (P) 98.3 F (36.8 C)  SpO2: 94% (P) 93%   Vitals:  06/19/21 1457 06/19/21 1959 06/19/21 2012 06/20/21 0435  BP:   132/83 (!) (P) 142/88  Pulse:   91 (P) 74  Resp:   20 (P) 20  Temp:   (!) 97.5 F (36.4 C) (P) 98.3 F (36.8 C)  TempSrc:   Oral (P) Oral  SpO2: 94% 95% 94% (P) 93%  Weight:    (P) 104.7 kg  Height:        General: Alert awake, not in obvious distress, obese built, on room air HENT: pupils  equally reacting to light,  No scleral pallor or icterus noted. Oral mucosa is moist.  Chest:  Clear breath sounds.  Diminished breath sounds bilaterally.  Minimal wheezes.   CVS: S1 &S2 heard. No murmur.  Regular rate and rhythm. Abdomen: Soft, nontender, nondistended.  Bowel sounds are heard.   Extremities: No cyanosis, clubbing . Peripheral pulses are palpable.  Right great toe with significantly improved erythema, tenderness has significantly improved. Psych: Alert, awake and oriented, normal mood CNS:  No cranial nerve deficits.  Power equal in all extremities.   Skin: Warm and dry.  No rashes noted.  The results of significant diagnostics from this hospitalization (including imaging, microbiology, ancillary and laboratory) are listed below for reference.     Diagnostic Studies:   DG Chest 2 View  Result Date: 06/16/2021 CLINICAL DATA:  Shortness of breath, weight gain, lower extremity swelling EXAM: CHEST - 2 VIEW COMPARISON:  05/04/2021 FINDINGS: Frontal and lateral views of the chest demonstrate a stable cardiac silhouette. Increased veiling opacity at the left lung base consistent with enlarging left pleural effusion and increasing left lower lobe consolidation. Right chest is clear. No pneumothorax. No acute bony abnormalities. Prior resorption or resection of the left posterior fourth rib. IMPRESSION: 1. Progressive left basilar consolidation and effusion. Electronically Signed   By: Randa Ngo M.D.   On: 06/16/2021 18:20   CT Abdomen Pelvis W Contrast  Result Date: 06/16/2021 CLINICAL DATA:  30 pound weight gain over 4 days, lower abdominal and leg swelling, abdominal pain EXAM: CT ABDOMEN AND PELVIS WITH CONTRAST TECHNIQUE: Multidetector CT imaging of the abdomen and pelvis was performed using the standard protocol following bolus administration of intravenous contrast. RADIATION DOSE REDUCTION: This exam was performed according to the departmental dose-optimization program which  includes automated exposure control, adjustment of the mA and/or kV according to patient size and/or use of iterative reconstruction technique. CONTRAST:  153mL OMNIPAQUE IOHEXOL 300 MG/ML  SOLN COMPARISON:  None. FINDINGS: Lower chest: No acute pleural or parenchymal lung disease. Mild bibasilar subpleural scarring. Hepatobiliary: Diffuse hepatic steatosis. No focal liver abnormality. Gallbladder is unremarkable. Pancreas: Unremarkable. No pancreatic ductal dilatation or surrounding inflammatory changes. Spleen: Normal in size without focal abnormality. Adrenals/Urinary Tract: Kidneys enhance normally and symmetrically. No urinary tract calculi or obstruction. The adrenals and bladder are unremarkable. Stomach/Bowel: No bowel obstruction or ileus. Minimal diverticulosis of the descending and sigmoid colon. No evidence of acute diverticulitis. Normal appendix right lower quadrant. No bowel wall thickening or inflammatory change. Vascular/Lymphatic: Aortic atherosclerosis. No enlarged abdominal or pelvic lymph nodes. Reproductive: Prostate is unremarkable. Other: No free fluid or free gas. Fat containing umbilical hernia. No bowel herniation. Musculoskeletal: No acute or destructive bony lesions. Reconstructed images demonstrate no additional findings. IMPRESSION: 1. Distal colonic diverticulosis without diverticulitis. 2. Fat containing umbilical hernia. 3. Hepatic steatosis. 4.  Aortic Atherosclerosis (ICD10-I70.0). Electronically Signed   By: Randa Ngo M.D.   On: 06/16/2021 23:03   DG Foot 2 Views Right  Result Date: 06/17/2021 CLINICAL DATA:  71 year old male with swelling of the right foot for the past 5 days with burning sensation in the right foot. EXAM: RIGHT FOOT - 2 VIEW COMPARISON:  No priors. FINDINGS: Two views of the right foot demonstrate no acute displaced fracture, subluxation or dislocation. Small plantar calcaneal spur incidentally noted. Mild degenerative changes of osteoarthritis, most  evident at the first MTP joint. IMPRESSION: 1. No acute radiographic abnormality of the right foot. Electronically Signed   By: Vinnie Langton M.D.   On: 06/17/2021 11:06     Labs:   Basic Metabolic Panel: Recent Labs  Lab 06/16/21 1829 06/17/21 1146 06/18/21 0535 06/19/21 0451 06/20/21 0437  NA 138 138 137 135 134*  K 3.6 3.5 3.8 4.2 4.2  CL 105 103 104 99 95*  CO2 23 28 23 26 28   GLUCOSE 107* 173* 169* 201* 175*  BUN 30* 27* 38* 48* 64*  CREATININE 1.19 1.15 1.38* 1.43* 1.56*  CALCIUM 9.0 9.2 9.0 9.3 9.4  MG 2.1  --  1.9 2.2 2.5*  PHOS  --   --  3.0  --   --    GFR Estimated Creatinine Clearance: 53.1 mL/min (A) (by C-G formula based on SCr of 1.56 mg/dL (H)). Liver Function Tests: Recent Labs  Lab 06/16/21 1829  AST 19  ALT 28  ALKPHOS 95  BILITOT 0.7  PROT 7.2  ALBUMIN 4.4   Recent Labs  Lab 06/16/21 2200  LIPASE 43   No results for input(s): AMMONIA in the last 168 hours. Coagulation profile No results for input(s): INR, PROTIME in the last 168 hours.  CBC: Recent Labs  Lab 06/16/21 1829 06/17/21 1146 06/18/21 0535 06/19/21 0451 06/20/21 0437  WBC 6.5 7.9 15.9* 16.1* 15.5*  NEUTROABS 3.5  --   --   --   --   HGB 15.4 14.8 14.6 14.3 15.7  HCT 46.6 44.7 44.0 42.8 46.4  MCV 88.8 88.5 87.6 87.5 86.4  PLT 233 213 245 257 290   Cardiac Enzymes: No results for input(s): CKTOTAL, CKMB, CKMBINDEX, TROPONINI in the last 168 hours. BNP: Invalid input(s): POCBNP CBG: Recent Labs  Lab 06/19/21 1144 06/19/21 1717 06/19/21 2057 06/20/21 0745  GLUCAP 177* 244* 206* 161*   D-Dimer No results for input(s): DDIMER in the last 72 hours. Hgb A1c Recent Labs    06/19/21 0451  HGBA1C 5.9*   Lipid Profile No results for input(s): CHOL, HDL, LDLCALC, TRIG, CHOLHDL, LDLDIRECT in the last 72 hours. Thyroid function studies No results for input(s): TSH, T4TOTAL, T3FREE, THYROIDAB in the last 72 hours.  Invalid input(s): FREET3 Anemia work up No  results for input(s): VITAMINB12, FOLATE, FERRITIN, TIBC, IRON, RETICCTPCT in the last 72 hours. Microbiology Recent Results (from the past 240 hour(s))  Resp Panel by RT-PCR (Flu A&B, Covid) Nasopharyngeal Swab     Status: None   Collection Time: 06/16/21  6:30 PM   Specimen: Nasopharyngeal Swab; Nasopharyngeal(NP) swabs in vial transport medium  Result Value Ref Range Status   SARS Coronavirus 2 by RT PCR NEGATIVE NEGATIVE Final    Comment: (NOTE) SARS-CoV-2 target nucleic acids are NOT DETECTED.  The SARS-CoV-2 RNA is generally detectable in upper respiratory specimens during the acute phase of infection. The lowest concentration of SARS-CoV-2 viral copies this assay can detect is 138 copies/mL. A negative result does not preclude SARS-Cov-2 infection and should not be used as the sole basis for treatment or other patient management decisions. A negative result may  occur with  improper specimen collection/handling, submission of specimen other than nasopharyngeal swab, presence of viral mutation(s) within the areas targeted by this assay, and inadequate number of viral copies(<138 copies/mL). A negative result must be combined with clinical observations, patient history, and epidemiological information. The expected result is Negative.  Fact Sheet for Patients:  EntrepreneurPulse.com.au  Fact Sheet for Healthcare Providers:  IncredibleEmployment.be  This test is no t yet approved or cleared by the [place] FDA and  has been authorized for detection and/or diagnosis of SARS-CoV-2 by FDA under an Emergency Use Authorization (EUA). This EUA will remain  in effect (meaning this test can be used) for the duration of the COVID-19 declaration under Section 564(b)(1) of the Act, 21 U.S.C.section 360bbb-3(b)(1), unless the authorization is terminated  or revoked sooner.       Influenza A by PCR NEGATIVE NEGATIVE Final   Influenza B by PCR  NEGATIVE NEGATIVE Final    Comment: (NOTE) The Xpert Xpress SARS-CoV-2/FLU/RSV plus assay is intended as an aid in the diagnosis of influenza from Nasopharyngeal swab specimens and should not be used as a sole basis for treatment. Nasal washings and aspirates are unacceptable for Xpert Xpress SARS-CoV-2/FLU/RSV testing.  Fact Sheet for Patients: EntrepreneurPulse.com.au  Fact Sheet for Healthcare Providers: IncredibleEmployment.be  This test is not yet approved or cleared by the [place] FDA and has been authorized for detection and/or diagnosis of SARS-CoV-2 by FDA under an Emergency Use Authorization (EUA). This EUA will remain in effect (meaning this test can be used) for the duration of the COVID-19 declaration under Section 564(b)(1) of the Act, 21 U.S.C. section 360bbb-3(b)(1), unless the authorization is terminated or revoked.  Performed at [facility], [place] [address], [place], [place] [address]      Discharge Instructions:   Discharge Instructions     (HEART FAILURE PATIENTS) Call MD:  Anytime you have any of the following symptoms: 1) 3 pound weight gain in 24 hours or 5 pounds in 1 week 2) shortness of breath, with or without a dry hacking cough 3) swelling in the hands, feet or stomach 4) if you have to sleep on extra pillows at night in order to breathe.   Complete by: As directed    Diet - low sodium heart healthy   Complete by: As directed    Fluid restriction 1500 MLS per day.   Discharge instructions   Complete by: As directed    Follow up with your primary care physician in one week.  No overexertion.  Discuss with your primary care physician regarding gout treatment for the long-term.  Fluid restriction 1500 mils per day.  Stay on low-sodium diet.   Heart Failure patients record your daily weight using the same scale at the same time of day   Complete by: As directed    Increase activity slowly    Complete by: As directed       Allergies as of 06/20/2021   No Known Allergies      Medication List     TAKE these medications    albuterol 108 (90 Base) MCG/ACT inhaler Commonly known as: VENTOLIN HFA Inhale 1 puff into the lungs every 6 (six) hours as needed for wheezing or shortness of breath. What changed: Another medication with the same name was added. Make sure you understand how and when to take each.   albuterol (2.5 MG/3ML) 0.083% nebulizer solution Commonly known as: PROVENTIL Take 3 mLs (2.5 mg total) by nebulization every  4 (four) hours as needed for wheezing or shortness of breath. What changed: You were already taking a medication with the same name, and this prescription was added. Make sure you understand how and when to take each.   amLODipine 10 MG tablet Commonly known as: NORVASC Take 10 mg by mouth daily.   aspirin EC 81 MG tablet Take 81 mg by mouth daily. Swallow whole.   CVS Aspirin Adult Low Dose 81 MG chewable tablet Generic drug: aspirin Chew 81 mg by mouth daily.   atorvastatin 80 MG tablet Commonly known as: LIPITOR Take 80 mg by mouth daily.   clopidogrel 75 MG tablet Commonly known as: PLAVIX Take 75 mg by mouth daily.   colchicine 0.6 MG tablet Take 1 tablet (0.6 mg total) by mouth daily.   erythromycin ophthalmic ointment Place 1 application into both eyes 3 (three) times daily.   furosemide 40 MG tablet Commonly known as: LASIX Take 40 mg by mouth at bedtime.   furosemide 80 MG tablet Commonly known as: LASIX Take 80 mg by mouth every morning.   gabapentin 100 MG capsule Commonly known as: NEURONTIN Take 1 capsule (100 mg total) by mouth 2 (two) times daily.   guaiFENesin 600 MG 12 hr tablet Commonly known as: MUCINEX Take 1 tablet (600 mg total) by mouth 2 (two) times daily.   metoprolol tartrate 50 MG tablet Commonly known as: LOPRESSOR Take 50 mg by mouth 2 (two) times daily.   multivitamin with minerals Tabs  tablet Take 1 tablet by mouth daily.   nitroGLYCERIN 0.4 MG SL tablet Commonly known as: NITROSTAT Place 0.4 mg under the tongue every 5 (five) minutes as needed for chest pain.   oxybutynin 5 MG 24 hr tablet Commonly known as: DITROPAN-XL Take 5 mg by mouth daily.   pantoprazole 40 MG tablet Commonly known as: PROTONIX Take 1 tablet (40 mg total) by mouth daily.   predniSONE 10 MG tablet Commonly known as: DELTASONE Take 4 tablets (40 mg) daily for 2 days, then, Take 3 tablets (30 mg) daily for 2 days, then, Take 2 tablets (20 mg) daily for 2 days, then, Take 1 tablets (10 mg) daily for 1 days, then stop   tamsulosin 0.4 MG Caps capsule Commonly known as: FLOMAX Take 0.4 mg by mouth daily.   Trelegy Ellipta 100-62.5-25 MCG/ACT Aepb Generic drug: Fluticasone-Umeclidin-Vilant Inhale 1 puff into the lungs daily.               Durable Medical Equipment  (From admission, onward)           Start     Ordered   06/18/21 0754  For home use only DME Nebulizer machine  Once       Question Answer Comment  Patient needs a nebulizer to treat with the following condition COPD (chronic obstructive pulmonary disease) ([place])   Length of Need Lifetime      06/18/21 0754            Follow-up Information     Mardi Mainland, FNP Follow up.   Specialty: Nurse Practitioner Contact information: [address] [place]  [address] [phone]                  Time coordinating discharge: 39 minutes  Signed:  Clinton Wahlberg  [facility] 06/20/2021, 7:56 AM

## 2021-06-20 NOTE — Plan of Care (Signed)
No acute events overnight. Problem: Education: Goal: Knowledge of disease or condition will improve Outcome: Progressing Goal: Knowledge of the prescribed therapeutic regimen will improve Outcome: Progressing Goal: Individualized Educational Video(s) Outcome: Progressing   Problem: Activity: Goal: Ability to tolerate increased activity will improve Outcome: Progressing Goal: Will verbalize the importance of balancing activity with adequate rest periods Outcome: Progressing   Problem: Respiratory: Goal: Ability to maintain a clear airway will improve Outcome: Progressing Goal: Levels of oxygenation will improve Outcome: Progressing Goal: Ability to maintain adequate ventilation will improve Outcome: Progressing   Problem: Education: Goal: Knowledge of General Education information will improve Description: Including pain rating scale, medication(s)/side effects and non-pharmacologic comfort measures Outcome: Progressing   Problem: Health Behavior/Discharge Planning: Goal: Ability to manage health-related needs will improve Outcome: Progressing   Problem: Clinical Measurements: Goal: Ability to maintain clinical measurements within normal limits will improve Outcome: Progressing Goal: Will remain free from infection Outcome: Progressing Goal: Diagnostic test results will improve Outcome: Progressing Goal: Respiratory complications will improve Outcome: Progressing Goal: Cardiovascular complication will be avoided Outcome: Progressing   Problem: Activity: Goal: Risk for activity intolerance will decrease Outcome: Progressing   Problem: Nutrition: Goal: Adequate nutrition will be maintained Outcome: Progressing   Problem: Coping: Goal: Level of anxiety will decrease Outcome: Progressing   Problem: Elimination: Goal: Will not experience complications related to bowel motility Outcome: Progressing Goal: Will not experience complications related to urinary  retention Outcome: Progressing   Problem: Pain Managment: Goal: General experience of comfort will improve Outcome: Progressing   Problem: Safety: Goal: Ability to remain free from injury will improve Outcome: Progressing   Problem: Skin Integrity: Goal: Risk for impaired skin integrity will decrease Outcome: Progressing

## 2021-08-28 ENCOUNTER — Ambulatory Visit: Payer: Self-pay | Admitting: Surgery

## 2021-08-28 NOTE — Pre-Procedure Instructions (Signed)
Surgical Instructions ? ? ? Your procedure is scheduled on Tuesday, April 18th. ? Report to [facility] Main Entrance "A" at 11:45 A.M., then check in with the Admitting office. ? Call this number if you have problems the morning of surgery: ? [phone] ? ? If you have any questions prior to your surgery date call [phone]: Open Monday-Friday 8am-4pm ? ? ? Remember: ? Do not eat after midnight the night before your surgery ? ?You may drink clear liquids until 10:45 AM the morning of your surgery.   ?Clear liquids allowed are: Water, Non-Citrus Juices (without pulp), Carbonated Beverages, Clear Tea, Black Coffee Only (NO MILK, CREAM OR POWDERED CREAMER of any kind), and Gatorade. ?  ? Take these medicines the morning of surgery with A SIP OF WATER  ?amLODipine (NORVASC) ?atorvastatin (LIPITOR) ?colchicine ?gabapentin (NEURONTIN) ?metoprolol tartrate (LOPRESSOR)  ?oxybutynin (DITROPAN-XL)  ?pantoprazole (PROTONIX) ?tamsulosin [facility]) ?TRELEGY ELLIPTA ? ?If needed: ?albuterol (PROVENTIL) (2.5 MG/3ML) 0.083% nebulizer ?albuterol (VENTOLIN HFA) 108 (90 Base)- bring inhaler with you on day of surgery if needed ?nitroGLYCERIN (NITROSTAT)- if you use this, notify your nurse on arrival to pre-op ? ?Follow your surgeon's instructions on when to stop Aspirin and clopidogrel (PLAVIX).  If no instructions were given by your surgeon then you will need to call the office to get those instructions.    ? ? ?As of today, STOP taking any Aleve, Naproxen, Ibuprofen, Motrin, Advil, Goody's, BC's, all herbal medications, fish oil, and all vitamins. ?         ?           ?Do NOT Smoke (Tobacco/Vaping) for 24 hours prior to your procedure. ? ?If you use a CPAP at night, you may bring your mask/headgear for your overnight stay. ?  ?Contacts, glasses, piercing's, hearing aid's, dentures or partials may not be worn into surgery, please bring cases for these belongings.  ?  ?For patients admitted to the hospital, discharge time will be  determined by your treatment team. ?  ?Patients discharged the day of surgery will not be allowed to drive home, and someone needs to stay with them for 24 hours. ? ?SURGICAL WAITING ROOM VISITATION ?Patients having surgery or a procedure may have two support people in the waiting room. These visitors may be switched out with other visitors if needed. ?Children under the age of 83 must have an adult accompany them who is not the patient. ?If the patient needs to stay at the hospital during part of their recovery, the visitor guidelines for inpatient rooms apply. ? ?Please refer to the [place] website for the visitor guidelines for Inpatients (after your surgery is over and you are in a regular room).  ? ? ?Special instructions:   ?[facility]- Preparing For Surgery ? ?Before surgery, you can play an important role. Because skin is not sterile, your skin needs to be as free of germs as possible. You can reduce the number of germs on your skin by washing with CHG (chlorahexidine gluconate) Soap before surgery.  CHG is an antiseptic cleaner which kills germs and bonds with the skin to continue killing germs even after washing.   ? ?Oral Hygiene is also important to reduce your risk of infection.  Remember - BRUSH YOUR TEETH THE MORNING OF SURGERY WITH YOUR REGULAR TOOTHPASTE ? ?Please do not use if you have an allergy to CHG or antibacterial soaps. If your skin becomes reddened/irritated stop using the CHG.  ?Do not shave (including legs and underarms) for  at least 48 hours prior to first CHG shower. It is OK to shave your face. ? ?Please follow these instructions carefully. ?  ?Shower the NIGHT BEFORE SURGERY and the MORNING OF SURGERY ? ?If you chose to wash your hair, wash your hair first as usual with your normal shampoo. ? ?After you shampoo, rinse your hair and body thoroughly to remove the shampoo. ? ?Use CHG Soap as you would any other liquid soap. You can apply CHG directly to the skin and wash gently with  a scrungie or a clean washcloth.  ? ?Apply the CHG Soap to your body ONLY FROM THE NECK DOWN.  Do not use on open wounds or open sores. Avoid contact with your eyes, ears, mouth and genitals (private parts). Wash Face and genitals (private parts)  with your normal soap.  ? ?Wash thoroughly, paying special attention to the area where your surgery will be performed. ? ?Thoroughly rinse your body with warm water from the neck down. ? ?DO NOT shower/wash with your normal soap after using and rinsing off the CHG Soap. ? ?Pat yourself dry with a CLEAN TOWEL. ? ?Wear CLEAN PAJAMAS to bed the night before surgery ? ?Place CLEAN SHEETS on your bed the night before your surgery ? ?DO NOT SLEEP WITH PETS. ? ? ?Day of Surgery: ?Take a shower with CHG soap. ?Do not wear jewelry  ?Do not wear lotions, powders, colognes, or deodorant. ?Do not shave 48 hours prior to surgery.  Men may shave face and neck. ?Do not bring valuables to the hospital. [facility] is not responsible for any belongings or valuables. ? ?Wear Clean/Comfortable clothing the morning of surgery ?Remember to brush your teeth WITH YOUR REGULAR TOOTHPASTE. ?  ?Please read over the following fact sheets that you were given. ? ? ? ?If you received a COVID test during your pre-op visit  it is requested that you wear a mask when out in public, stay away from anyone that may not be feeling well and notify your surgeon if you develop symptoms. If you have been in contact with anyone that has tested positive in the last 10 days please notify you surgeon.  ?

## 2021-08-29 ENCOUNTER — Encounter (HOSPITAL_COMMUNITY)
Admission: RE | Admit: 2021-08-29 | Discharge: 2021-08-29 | Disposition: A | Discharge status: Home / Self Care | Payer: Medicare Other | Source: Ambulatory Visit | Attending: Surgery | Admitting: Surgery

## 2021-08-29 ENCOUNTER — Other Ambulatory Visit: Payer: Self-pay

## 2021-08-29 ENCOUNTER — Encounter (HOSPITAL_COMMUNITY): Payer: Self-pay

## 2021-08-29 VITALS — BP 134/64 | HR 66 | Temp 97.4°F | Resp 18 | Ht 69.0 in | Wt 246.9 lb

## 2021-08-29 DIAGNOSIS — I5033 Acute on chronic diastolic (congestive) heart failure: Secondary | ICD-10-CM | POA: Insufficient documentation

## 2021-08-29 DIAGNOSIS — J449 Chronic obstructive pulmonary disease, unspecified: Secondary | ICD-10-CM | POA: Insufficient documentation

## 2021-08-29 DIAGNOSIS — N183 Chronic kidney disease, stage 3 unspecified: Secondary | ICD-10-CM | POA: Diagnosis not present

## 2021-08-29 DIAGNOSIS — I130 Hypertensive heart and chronic kidney disease with heart failure and stage 1 through stage 4 chronic kidney disease, or unspecified chronic kidney disease: Secondary | ICD-10-CM | POA: Insufficient documentation

## 2021-08-29 DIAGNOSIS — E785 Hyperlipidemia, unspecified: Secondary | ICD-10-CM | POA: Diagnosis not present

## 2021-08-29 DIAGNOSIS — Z01812 Encounter for preprocedural laboratory examination: Secondary | ICD-10-CM | POA: Insufficient documentation

## 2021-08-29 DIAGNOSIS — Z01818 Encounter for other preprocedural examination: Secondary | ICD-10-CM

## 2021-08-29 DIAGNOSIS — G4733 Obstructive sleep apnea (adult) (pediatric): Secondary | ICD-10-CM | POA: Diagnosis not present

## 2021-08-29 DIAGNOSIS — Z9989 Dependence on other enabling machines and devices: Secondary | ICD-10-CM | POA: Insufficient documentation

## 2021-08-29 HISTORY — DX: Dyspnea, unspecified: R06.00

## 2021-08-29 LAB — BASIC METABOLIC PANEL
Anion gap: 7 (ref 5–15)
BUN: 39 mg/dL — ABNORMAL HIGH (ref 8–23)
CO2: 27 mmol/L (ref 22–32)
Calcium: 9.2 mg/dL (ref 8.9–10.3)
Chloride: 104 mmol/L (ref 98–111)
Creatinine, Ser: 1.45 mg/dL — ABNORMAL HIGH (ref 0.61–1.24)
GFR, Estimated: 52 mL/min — ABNORMAL LOW (ref >60)
Glucose, Bld: 94 mg/dL (ref 70–99)
Potassium: 4.7 mmol/L (ref 3.5–5.1)
Sodium: 138 mmol/L (ref 135–145)

## 2021-08-29 LAB — CBC
HCT: 42.4 % (ref 39.0–52.0)
Hemoglobin: 14 g/dL (ref 13.0–17.0)
MCH: 29.7 pg (ref 26.0–34.0)
MCHC: 33 g/dL (ref 30.0–36.0)
MCV: 89.8 fL (ref 80.0–100.0)
Platelets: 196 10*3/uL (ref 150–400)
RBC: 4.72 MIL/uL (ref 4.22–5.81)
RDW: 14.8 % (ref 11.5–15.5)
WBC: 6.8 10*3/uL (ref 4.0–10.5)
nRBC: 0 % (ref 0.0–0.2)

## 2021-08-29 NOTE — Progress Notes (Signed)
PCP:  Willey Blade, MD ?Cardiologist: Tonia Ghent, MD [facility] location ? ?EKG: 06/17/21 ?CXR:  na ?ECHO:  05/05/21 ?Stress Test:  denies ?Cardiac Cath:  2013 and patient states 2021 or 2022 ? ?Fasting Blood Sugar-  na ?Checks Blood Sugar__na_ times a day ? ?ASA: Per patient, continue through day before surgery ?Plavix: Per patient, last dose 09/02/21 ? ?OSA/CPAP: yes, wears cpap nightly ? ?Covid test not needed ? ?Anesthesia Review: Yes, cardiac history.  Cardiac stents and CABG.  Requested records from Dr. Edson Snowball.  Patient states he was in prison when tests were performed and were done at [facility] in [place]. ? ?Patient denies shortness of breath, fever, cough, and chest pain at PAT appointment. ? ?Patient verbalized understanding of instructions provided today at the PAT appointment.  Patient asked to review instructions at home and day of surgery.   ?

## 2021-08-30 NOTE — Progress Notes (Signed)
Anesthesia Chart Review: ? ?Pertinent history includes HFpEF, COPD (maintained on Trelegy), OSA on CPAP, hypertension, hyperlipidemia, CKD 3.  He was admitted 12/16 - 05/07/2021 with respiratory failure secondary to acute on chronic diastolic CHF exacerbation.  Echo on admission showed normal EF, diastolic dysfunction.  He was treated with Lasix with improvement in symptoms.  Weight decreased from 245 down to 229.  He was again admitted 1/28 through 1/54/0086 for diastolic heart failure/COPD exacerbation.  He improved with bronchodilators, IV steroids, and diuresis.  14 pound weight loss during admission. ? ?Follows with cardiologist Dr. Edson Snowball at [facility].  Per notes, patient has history of CAD s/p 2 stents in 2000 and 3 stents in 2016.  Recent echo 07/24/2021 showed moderate concentric LVH, EF 60 to 76%, diastolic dysfunction, no significant valvular abnormalities.  Nuclear stress 08/01/2020 showed no significant reversible ischemia.  Dr. Edson Snowball cleared the patient per note dated 08/07/2021 stating, " upon examination I have determined the patient to be fit for medical and cardiac clearance for his open umbilical hernia repair surgery, under general anesthesia.  Patient can hold his Plavix medication up to 3 days preoperatively and 5 days postoperatively.  Please contact the office with any further questions."  Copy on chart. ? ?Preop labs reviewed, creatinine mildly elevated 1.45 consistent with history of CKD 3, otherwise unremarkable. ? ?EKG 06/17/2021: Ventricular trigeminy.  Rate 95. ? ?TTE 05/05/2021 (copy on chart): ? 1. Left ventricular ejection fraction, by estimation, is 60 to 65%. The  ?left ventricle has normal function. The left ventricle has no regional  ?wall motion abnormalities. Left ventricular diastolic parameters are  ?consistent with Grade I diastolic  ?dysfunction (impaired relaxation).  ? 2. Right ventricular systolic function is normal. The right ventricular  ?size is normal.  Tricuspid regurgitation signal is inadequate for assessing  ?PA pressure.  ? 3. Left atrial size was mildly dilated.  ? 4. The mitral valve is grossly normal. No evidence of mitral valve  ?regurgitation.  ? 5. The aortic valve is grossly normal. Aortic valve regurgitation is not  ?visualized.  ? 6. The inferior vena cava IVC not well visualized.  ? ?Nuclear stress 08/01/2020 (copy on chart): ?Findings: ?1.  Gated images show overall ejection fraction of 59%. ?2.  Small apical septal defect at rest and stress. ?3.  No significant reversible ischemia was noted. ? ?Impression: ?Overall normal ejection fraction of 59% with small region of fixed defect noted in the apical septum both at rest and stress. ? ? ? ?Karoline Caldwell, PA-C ?[facility]/Anesthesiology ?Phone [phone] ?09/01/2021 3:48 PM ? ?

## 2021-09-05 ENCOUNTER — Ambulatory Visit (HOSPITAL_COMMUNITY): Payer: Medicare Other

## 2021-09-05 ENCOUNTER — Encounter (HOSPITAL_COMMUNITY): Payer: Self-pay | Admitting: Surgery

## 2021-09-05 ENCOUNTER — Ambulatory Visit (HOSPITAL_COMMUNITY): Payer: Medicare Other | Admitting: Anesthesiology

## 2021-09-05 ENCOUNTER — Encounter (HOSPITAL_COMMUNITY)
Admission: RE | Disposition: A | Discharge status: Home Health | Payer: Self-pay | Source: Home / Self Care | Attending: Surgery

## 2021-09-05 ENCOUNTER — Inpatient Hospital Stay (HOSPITAL_COMMUNITY)
Admission: RE | Admit: 2021-09-05 | Discharge: 2021-09-12 | DRG: 354 | Disposition: A | Discharge status: Home Health | Payer: Medicare Other | Attending: Surgery | Admitting: Surgery

## 2021-09-05 ENCOUNTER — Other Ambulatory Visit: Payer: Self-pay

## 2021-09-05 ENCOUNTER — Ambulatory Visit (HOSPITAL_COMMUNITY): Payer: Medicare Other | Admitting: Physician Assistant

## 2021-09-05 DIAGNOSIS — R1310 Dysphagia, unspecified: Secondary | ICD-10-CM | POA: Diagnosis not present

## 2021-09-05 DIAGNOSIS — Z7982 Long term (current) use of aspirin: Secondary | ICD-10-CM

## 2021-09-05 DIAGNOSIS — Z87891 Personal history of nicotine dependence: Secondary | ICD-10-CM

## 2021-09-05 DIAGNOSIS — W19XXXA Unspecified fall, initial encounter: Secondary | ICD-10-CM | POA: Diagnosis present

## 2021-09-05 DIAGNOSIS — G4733 Obstructive sleep apnea (adult) (pediatric): Secondary | ICD-10-CM | POA: Diagnosis present

## 2021-09-05 DIAGNOSIS — R7303 Prediabetes: Secondary | ICD-10-CM | POA: Diagnosis present

## 2021-09-05 DIAGNOSIS — K760 Fatty (change of) liver, not elsewhere classified: Secondary | ICD-10-CM | POA: Diagnosis present

## 2021-09-05 DIAGNOSIS — K436 Other and unspecified ventral hernia with obstruction, without gangrene: Secondary | ICD-10-CM

## 2021-09-05 DIAGNOSIS — I110 Hypertensive heart disease with heart failure: Secondary | ICD-10-CM

## 2021-09-05 DIAGNOSIS — N189 Chronic kidney disease, unspecified: Secondary | ICD-10-CM | POA: Diagnosis present

## 2021-09-05 DIAGNOSIS — I130 Hypertensive heart and chronic kidney disease with heart failure and stage 1 through stage 4 chronic kidney disease, or unspecified chronic kidney disease: Secondary | ICD-10-CM | POA: Diagnosis present

## 2021-09-05 DIAGNOSIS — Z6834 Body mass index (BMI) 34.0-34.9, adult: Secondary | ICD-10-CM

## 2021-09-05 DIAGNOSIS — I509 Heart failure, unspecified: Secondary | ICD-10-CM | POA: Diagnosis not present

## 2021-09-05 DIAGNOSIS — I2510 Atherosclerotic heart disease of native coronary artery without angina pectoris: Secondary | ICD-10-CM

## 2021-09-05 DIAGNOSIS — K2100 Gastro-esophageal reflux disease with esophagitis, without bleeding: Secondary | ICD-10-CM

## 2021-09-05 DIAGNOSIS — Z79899 Other long term (current) drug therapy: Secondary | ICD-10-CM

## 2021-09-05 DIAGNOSIS — M79605 Pain in left leg: Secondary | ICD-10-CM | POA: Diagnosis present

## 2021-09-05 DIAGNOSIS — I252 Old myocardial infarction: Secondary | ICD-10-CM

## 2021-09-05 DIAGNOSIS — G4730 Sleep apnea, unspecified: Secondary | ICD-10-CM | POA: Diagnosis present

## 2021-09-05 DIAGNOSIS — R066 Hiccough: Secondary | ICD-10-CM | POA: Diagnosis present

## 2021-09-05 DIAGNOSIS — E669 Obesity, unspecified: Secondary | ICD-10-CM | POA: Diagnosis present

## 2021-09-05 DIAGNOSIS — Z955 Presence of coronary angioplasty implant and graft: Secondary | ICD-10-CM

## 2021-09-05 DIAGNOSIS — Z8719 Personal history of other diseases of the digestive system: Principal | ICD-10-CM

## 2021-09-05 DIAGNOSIS — Z7902 Long term (current) use of antithrombotics/antiplatelets: Secondary | ICD-10-CM

## 2021-09-05 DIAGNOSIS — I5032 Chronic diastolic (congestive) heart failure: Secondary | ICD-10-CM | POA: Diagnosis present

## 2021-09-05 DIAGNOSIS — E785 Hyperlipidemia, unspecified: Secondary | ICD-10-CM | POA: Diagnosis present

## 2021-09-05 DIAGNOSIS — J449 Chronic obstructive pulmonary disease, unspecified: Secondary | ICD-10-CM | POA: Diagnosis present

## 2021-09-05 HISTORY — PX: INSERTION OF MESH: SHX5868

## 2021-09-05 HISTORY — PX: VENTRAL HERNIA REPAIR: SHX424

## 2021-09-05 IMAGING — DX DG TIBIA/FIBULA PORT 2V*L*
4 series · 4 of 4 positions shown · non-contrast
Comparison: None

CLINICAL DATA: Left leg pain.

EXAM:
PORTABLE LEFT TIBIA AND FIBULA - 2 VIEW

[tibia ap (1 of 2)]
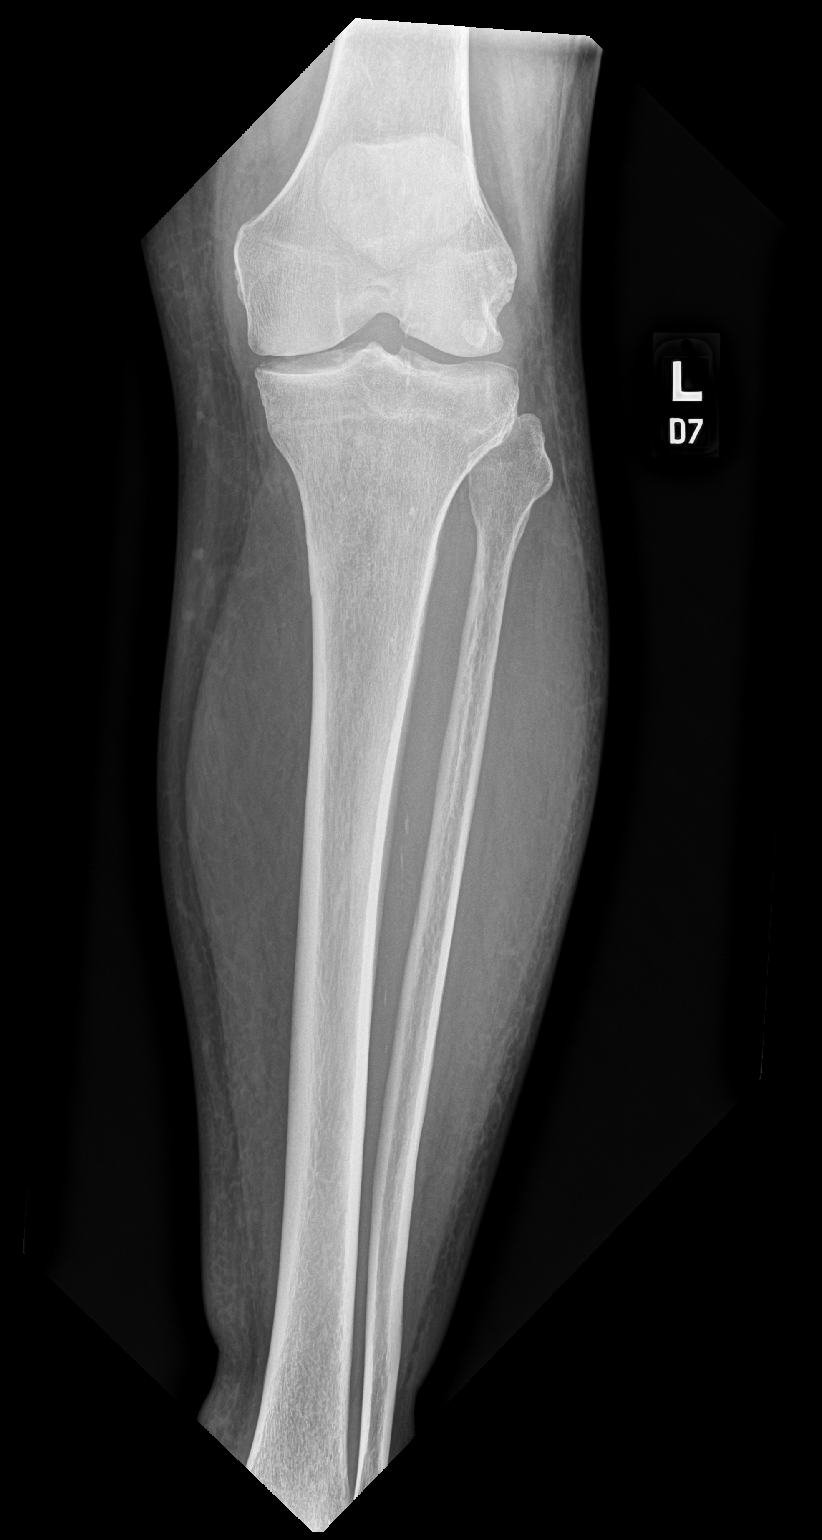

[tibia ap (2 of 2)]
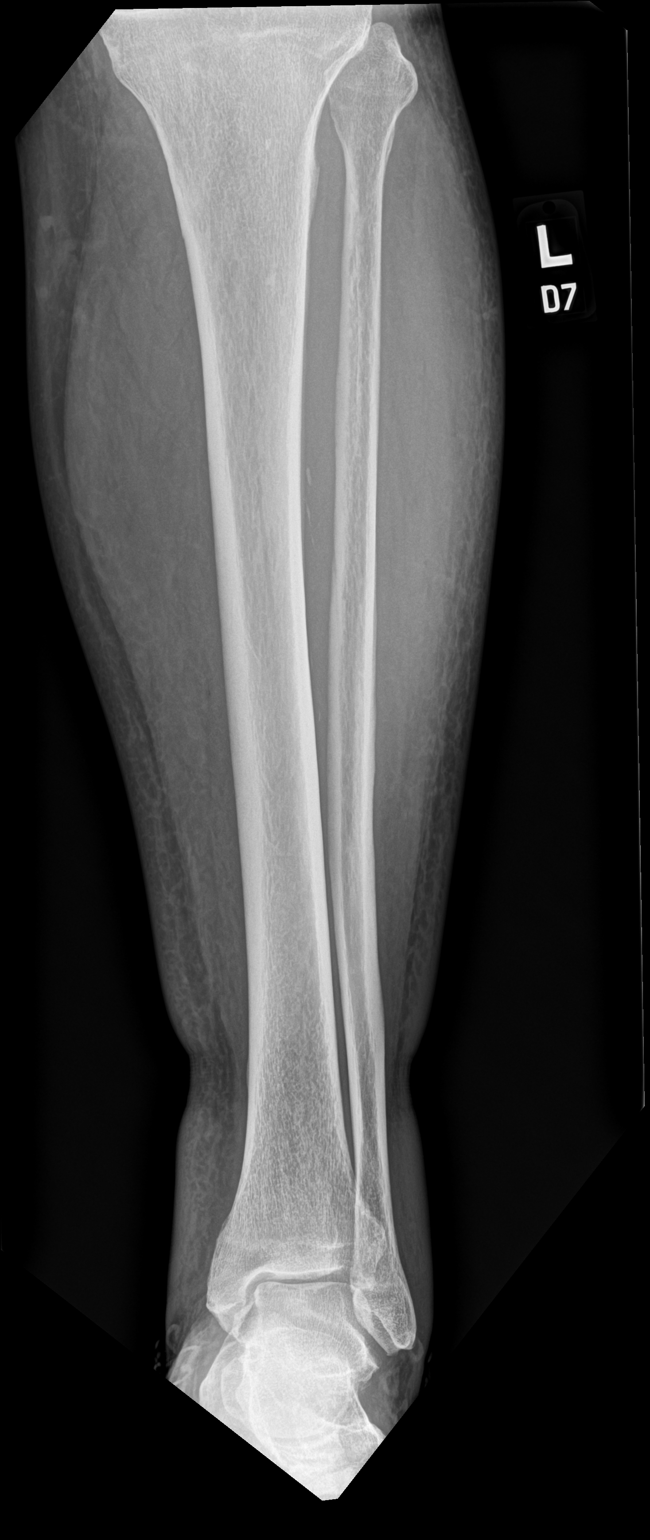

[tibia lat (1 of 2)]
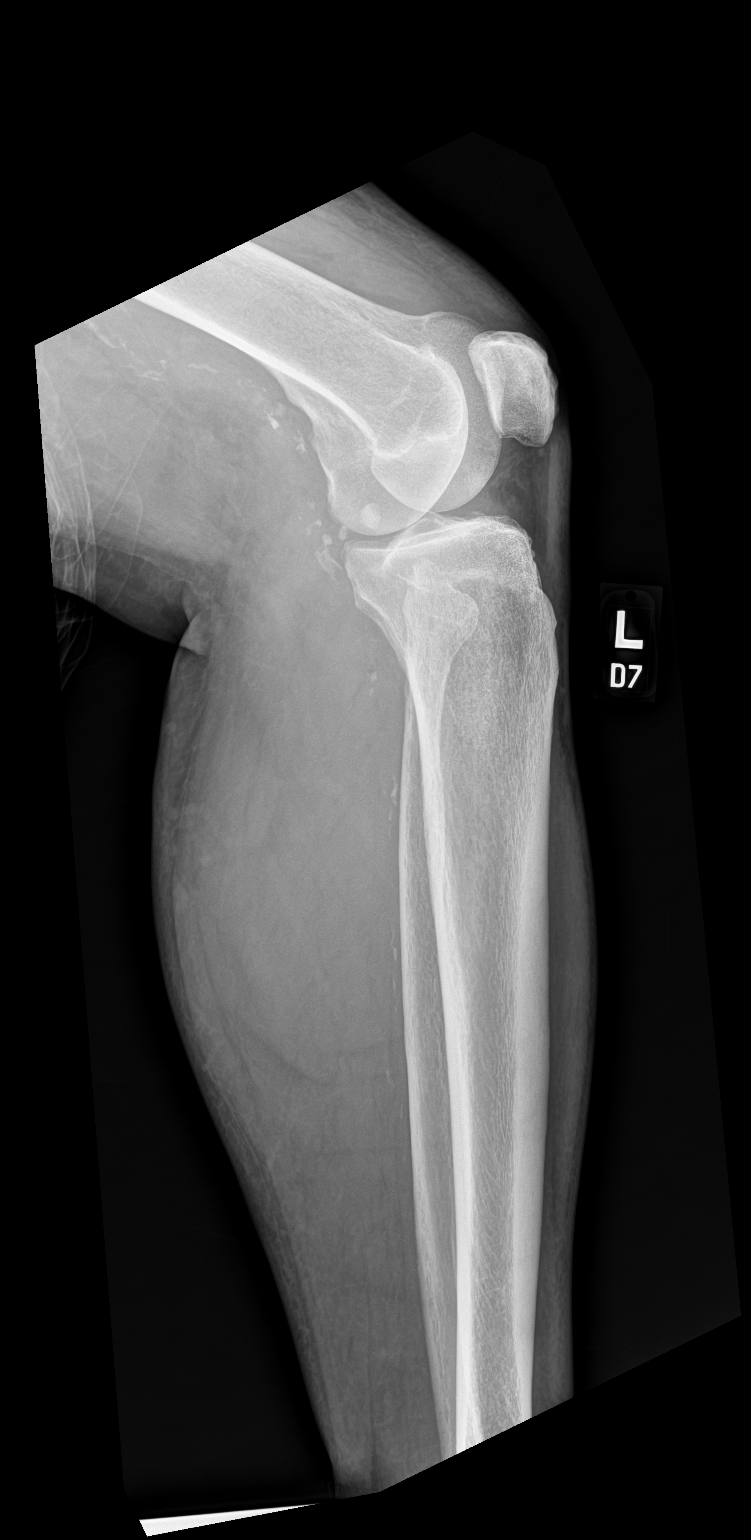

[tibia lat (2 of 2)]
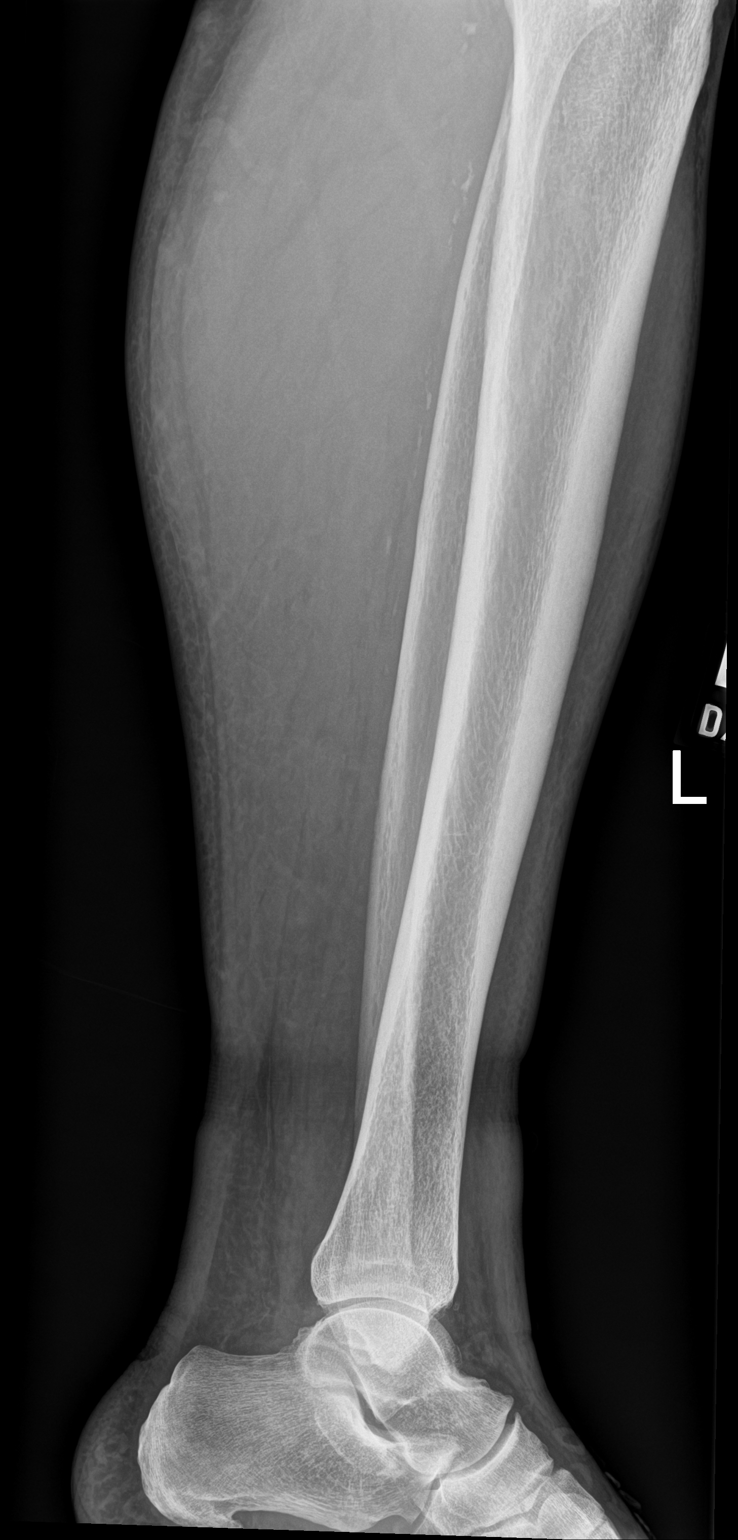

[4 of 4 positions shown; findings below may reference images not displayed]

FINDINGS: No acute fracture or dislocation. No aggressive osseous lesion.
Normal alignment. Generalized osteopenia.

Generalized soft tissue edema in the subcutaneous fat. Peripheral
vascular atherosclerotic disease. No radiopaque foreign body or soft
tissue emphysema.
IMPRESSION: 1. No acute osseous injury of the left tibia and fibula.

## 2021-09-05 SURGERY — REPAIR, HERNIA, VENTRAL, LAPAROSCOPIC
Anesthesia: General | Site: Abdomen

## 2021-09-05 MED ORDER — 0.9 % SODIUM CHLORIDE (POUR BTL) OPTIME
TOPICAL | Status: DC | PRN
  Administered 2021-09-05: 1000 mL

## 2021-09-05 MED ORDER — ACETAMINOPHEN 500 MG PO TABS
1000.0000 mg | ORAL_TABLET | Freq: Once | ORAL | Status: AC
  Administered 2021-09-05: 1000 mg via ORAL
  Filled 2021-09-05: qty 2

## 2021-09-05 MED ORDER — METHOCARBAMOL 750 MG PO TABS
750.0000 mg | ORAL_TABLET | Freq: Four times a day (QID) | ORAL | Status: DC
  Administered 2021-09-05 – 2021-09-11 (×25): 750 mg via ORAL
  Filled 2021-09-05 (×24): qty 1

## 2021-09-05 MED ORDER — CHLORHEXIDINE GLUCONATE CLOTH 2 % EX PADS: 6.0000 | MEDICATED_PAD | Freq: Once | CUTANEOUS | Status: DC

## 2021-09-05 MED ORDER — ONDANSETRON HCL 4 MG/2ML IJ SOLN
4.0000 mg | Freq: Four times a day (QID) | INTRAMUSCULAR | Status: DC | PRN
  Administered 2021-09-06 – 2021-09-10 (×6): 4 mg via INTRAVENOUS
  Filled 2021-09-05 (×6): qty 2

## 2021-09-05 MED ORDER — FENTANYL CITRATE (PF) 100 MCG/2ML IJ SOLN
INTRAMUSCULAR | Status: AC
Start: 2021-09-05 — End: 2021-09-06
  Filled 2021-09-05: qty 2

## 2021-09-05 MED ORDER — ROCURONIUM BROMIDE 10 MG/ML (PF) SYRINGE
PREFILLED_SYRINGE | INTRAVENOUS | Status: DC | PRN
  Administered 2021-09-05: 20 mg via INTRAVENOUS
  Administered 2021-09-05: 60 mg via INTRAVENOUS

## 2021-09-05 MED ORDER — ROCURONIUM BROMIDE 10 MG/ML (PF) SYRINGE
PREFILLED_SYRINGE | INTRAVENOUS | Status: AC
  Filled 2021-09-05: qty 10

## 2021-09-05 MED ORDER — CEFAZOLIN SODIUM-DEXTROSE 2-4 GM/100ML-% IV SOLN
2.0000 g | INTRAVENOUS | Status: AC
  Administered 2021-09-05: 2 g via INTRAVENOUS

## 2021-09-05 MED ORDER — LIDOCAINE 2% (20 MG/ML) 5 ML SYRINGE
INTRAMUSCULAR | Status: AC
  Filled 2021-09-05: qty 5

## 2021-09-05 MED ORDER — CHLORHEXIDINE GLUCONATE 0.12 % MT SOLN: 15.0000 mL | Freq: Once | OROMUCOSAL | Status: AC

## 2021-09-05 MED ORDER — HYDROMORPHONE HCL 1 MG/ML IJ SOLN
INTRAMUSCULAR | Status: AC
  Filled 2021-09-05: qty 1

## 2021-09-05 MED ORDER — ONDANSETRON HCL 4 MG/2ML IJ SOLN
INTRAMUSCULAR | Status: AC
  Filled 2021-09-05: qty 2

## 2021-09-05 MED ORDER — ONDANSETRON 4 MG PO TBDP: 4.0000 mg | ORAL_TABLET | Freq: Four times a day (QID) | ORAL | Status: DC | PRN

## 2021-09-05 MED ORDER — MORPHINE SULFATE (PF) 2 MG/ML IV SOLN
2.0000 mg | INTRAVENOUS | Status: DC | PRN
  Administered 2021-09-05 (×2): 2 mg via INTRAVENOUS
  Administered 2021-09-06 (×2): 4 mg via INTRAVENOUS
  Administered 2021-09-07: 2 mg via INTRAVENOUS
  Filled 2021-09-05 (×2): qty 1
  Filled 2021-09-05: qty 2
  Filled 2021-09-05: qty 1
  Filled 2021-09-05: qty 2
  Filled 2021-09-05 (×2): qty 1

## 2021-09-05 MED ORDER — HYDROMORPHONE HCL 1 MG/ML IJ SOLN
0.2500 mg | INTRAMUSCULAR | Status: DC | PRN
  Administered 2021-09-05 (×2): 0.5 mg via INTRAVENOUS

## 2021-09-05 MED ORDER — BUPIVACAINE-EPINEPHRINE 0.25% -1:200000 IJ SOLN
INTRAMUSCULAR | Status: DC | PRN
  Administered 2021-09-05: 30 mL

## 2021-09-05 MED ORDER — OXYCODONE HCL 5 MG/5ML PO SOLN: 5.0000 mg | Freq: Once | ORAL | Status: DC | PRN

## 2021-09-05 MED ORDER — CEFAZOLIN SODIUM-DEXTROSE 2-4 GM/100ML-% IV SOLN
INTRAVENOUS | Status: AC
  Filled 2021-09-05: qty 100

## 2021-09-05 MED ORDER — HEPARIN SODIUM (PORCINE) 5000 UNIT/ML IJ SOLN: 5000.0000 [IU] | Freq: Once | INTRAMUSCULAR | Status: AC

## 2021-09-05 MED ORDER — CHLORHEXIDINE GLUCONATE 0.12 % MT SOLN
OROMUCOSAL | Status: AC
  Administered 2021-09-05: 15 mL via OROMUCOSAL
  Filled 2021-09-05: qty 15

## 2021-09-05 MED ORDER — ONDANSETRON HCL 4 MG/2ML IJ SOLN
INTRAMUSCULAR | Status: DC | PRN
  Administered 2021-09-05: 4 mg via INTRAVENOUS

## 2021-09-05 MED ORDER — OXYCODONE HCL 5 MG/5ML PO SOLN
5.0000 mg | ORAL | Status: DC | PRN
  Administered 2021-09-05 – 2021-09-11 (×9): 10 mg via ORAL
  Filled 2021-09-05 (×9): qty 10

## 2021-09-05 MED ORDER — FENTANYL CITRATE (PF) 250 MCG/5ML IJ SOLN
INTRAMUSCULAR | Status: DC | PRN
  Administered 2021-09-05 (×3): 50 ug via INTRAVENOUS
  Administered 2021-09-05: 100 ug via INTRAVENOUS

## 2021-09-05 MED ORDER — HEPARIN SODIUM (PORCINE) 5000 UNIT/ML IJ SOLN
INTRAMUSCULAR | Status: AC
  Administered 2021-09-05: 5000 [IU] via SUBCUTANEOUS
  Filled 2021-09-05: qty 1

## 2021-09-05 MED ORDER — BUPIVACAINE-EPINEPHRINE (PF) 0.25% -1:200000 IJ SOLN
INTRAMUSCULAR | Status: AC
  Filled 2021-09-05: qty 30

## 2021-09-05 MED ORDER — PROPOFOL 10 MG/ML IV BOLUS
INTRAVENOUS | Status: DC | PRN
Start: 2021-09-05 — End: 2021-09-05
  Administered 2021-09-05: 170 mg via INTRAVENOUS

## 2021-09-05 MED ORDER — FENTANYL CITRATE (PF) 100 MCG/2ML IJ SOLN
INTRAMUSCULAR | Status: AC
  Filled 2021-09-05: qty 2

## 2021-09-05 MED ORDER — METOPROLOL TARTRATE 50 MG PO TABS
50.0000 mg | ORAL_TABLET | Freq: Once | ORAL | Status: AC
  Administered 2021-09-05: 50 mg via ORAL
  Filled 2021-09-05: qty 1

## 2021-09-05 MED ORDER — LIDOCAINE 2% (20 MG/ML) 5 ML SYRINGE
INTRAMUSCULAR | Status: DC | PRN
  Administered 2021-09-05: 60 mg via INTRAVENOUS

## 2021-09-05 MED ORDER — ENOXAPARIN SODIUM 40 MG/0.4ML IJ SOSY
40.0000 mg | PREFILLED_SYRINGE | INTRAMUSCULAR | Status: DC
  Administered 2021-09-06 – 2021-09-11 (×6): 40 mg via SUBCUTANEOUS
  Filled 2021-09-05 (×7): qty 0.4

## 2021-09-05 MED ORDER — OXYCODONE HCL 5 MG PO TABS: 5.0000 mg | ORAL_TABLET | Freq: Once | ORAL | Status: DC | PRN

## 2021-09-05 MED ORDER — DOCUSATE SODIUM 100 MG PO CAPS
100.0000 mg | ORAL_CAPSULE | Freq: Two times a day (BID) | ORAL | Status: DC
  Administered 2021-09-06 – 2021-09-09 (×7): 100 mg via ORAL
  Filled 2021-09-05 (×11): qty 1

## 2021-09-05 MED ORDER — FENTANYL CITRATE (PF) 250 MCG/5ML IJ SOLN
INTRAMUSCULAR | Status: AC
  Filled 2021-09-05: qty 5

## 2021-09-05 MED ORDER — BUPIVACAINE LIPOSOME 1.3 % IJ SUSP: 20.0000 mL | Freq: Once | INTRAMUSCULAR | Status: DC

## 2021-09-05 MED ORDER — PHENYLEPHRINE 80 MCG/ML (10ML) SYRINGE FOR IV PUSH (FOR BLOOD PRESSURE SUPPORT)
PREFILLED_SYRINGE | INTRAVENOUS | Status: AC
  Filled 2021-09-05: qty 10

## 2021-09-05 MED ORDER — ACETAMINOPHEN 500 MG PO TABS
1000.0000 mg | ORAL_TABLET | Freq: Four times a day (QID) | ORAL | Status: DC
  Administered 2021-09-05 – 2021-09-12 (×22): 1000 mg via ORAL
  Filled 2021-09-05 (×25): qty 2

## 2021-09-05 MED ORDER — SUCCINYLCHOLINE CHLORIDE 200 MG/10ML IV SOSY
PREFILLED_SYRINGE | INTRAVENOUS | Status: AC
  Filled 2021-09-05: qty 10

## 2021-09-05 MED ORDER — SUGAMMADEX SODIUM 200 MG/2ML IV SOLN
INTRAVENOUS | Status: DC | PRN
  Administered 2021-09-05: 200 mg via INTRAVENOUS

## 2021-09-05 MED ORDER — FENTANYL CITRATE (PF) 100 MCG/2ML IJ SOLN
25.0000 ug | INTRAMUSCULAR | Status: DC | PRN
  Administered 2021-09-05 (×3): 25 ug via INTRAVENOUS
  Administered 2021-09-05: 50 ug via INTRAVENOUS
  Administered 2021-09-05: 25 ug via INTRAVENOUS

## 2021-09-05 MED ORDER — ORAL CARE MOUTH RINSE: 15.0000 mL | Freq: Once | OROMUCOSAL | Status: AC

## 2021-09-05 MED ORDER — LACTATED RINGERS IV SOLN: INTRAVENOUS | Status: DC

## 2021-09-05 SURGICAL SUPPLY — 45 items
BAG COUNTER SPONGE SURGICOUNT (BAG) ×2 IMPLANT
BINDER ABDOMINAL 12 ML 46-62 (SOFTGOODS) IMPLANT
CANISTER SUCT 3000ML PPV (MISCELLANEOUS) ×1 IMPLANT
CHLORAPREP W/TINT 26 (MISCELLANEOUS) ×2 IMPLANT
COVER SURGICAL LIGHT HANDLE (MISCELLANEOUS) ×2 IMPLANT
DERMABOND ADVANCED (GAUZE/BANDAGES/DRESSINGS) ×1
DERMABOND ADVANCED .7 DNX12 (GAUZE/BANDAGES/DRESSINGS) ×1 IMPLANT
DEVICE SECURE STRAP 25 ABSORB (INSTRUMENTS) ×2 IMPLANT
DRAPE INCISE IOBAN 66X45 STRL (DRAPES) ×2 IMPLANT
ELECT CAUTERY BLADE 6.4 (BLADE) IMPLANT
ELECT REM PT RETURN 9FT ADLT (ELECTROSURGICAL) ×2
ELECTRODE REM PT RTRN 9FT ADLT (ELECTROSURGICAL) ×1 IMPLANT
GLOVE BIO SURGEON STRL SZ 6.5 (GLOVE) ×2 IMPLANT
GLOVE BIOGEL PI IND STRL 6 (GLOVE) ×1 IMPLANT
GLOVE BIOGEL PI INDICATOR 6 (GLOVE) ×1
GOWN STRL REUS W/ TWL LRG LVL3 (GOWN DISPOSABLE) ×3 IMPLANT
GOWN STRL REUS W/TWL LRG LVL3 (GOWN DISPOSABLE) ×3
GRASPER SUT TROCAR 14GX15 (MISCELLANEOUS) ×2 IMPLANT
KIT BASIN OR (CUSTOM PROCEDURE TRAY) ×2 IMPLANT
KIT TURNOVER KIT B (KITS) ×2 IMPLANT
MARKER SKIN DUAL TIP RULER LAB (MISCELLANEOUS) ×2 IMPLANT
MESH VENTRALIGHT ST 4.5IN (Mesh General) ×1 IMPLANT
NDL INSUFFLATION 14GA 120MM (NEEDLE) ×1 IMPLANT
NDL SPNL 22GX3.5 QUINCKE BK (NEEDLE) ×1 IMPLANT
NEEDLE INSUFFLATION 14GA 120MM (NEEDLE) ×2 IMPLANT
NEEDLE SPNL 22GX3.5 QUINCKE BK (NEEDLE) ×2 IMPLANT
NS IRRIG 1000ML POUR BTL (IV SOLUTION) ×2 IMPLANT
PAD ARMBOARD 7.5X6 YLW CONV (MISCELLANEOUS) ×4 IMPLANT
PENCIL BUTTON HOLSTER BLD 10FT (ELECTRODE) IMPLANT
SCISSORS LAP 5X35 DISP (ENDOMECHANICALS) IMPLANT
SET IRRIG TUBING LAPAROSCOPIC (IRRIGATION / IRRIGATOR) IMPLANT
SET TUBE SMOKE EVAC HIGH FLOW (TUBING) ×2 IMPLANT
SHEARS HARMONIC ACE PLUS 36CM (ENDOMECHANICALS) IMPLANT
SLEEVE ENDOPATH XCEL 5M (ENDOMECHANICALS) ×4 IMPLANT
SUT ETHIBOND CT1 BRD #0 30IN (SUTURE) ×1 IMPLANT
SUT MNCRL AB 4-0 PS2 18 (SUTURE) ×2 IMPLANT
SUT NOVA 1 T20/GS 25DT (SUTURE) ×2 IMPLANT
SUT VIC AB 3-0 SH 27 (SUTURE)
SUT VIC AB 3-0 SH 27XBRD (SUTURE) IMPLANT
TOWEL GREEN STERILE (TOWEL DISPOSABLE) ×2 IMPLANT
TOWEL GREEN STERILE FF (TOWEL DISPOSABLE) ×2 IMPLANT
TRAY LAPAROSCOPIC MC (CUSTOM PROCEDURE TRAY) ×2 IMPLANT
TROCAR XCEL NON-BLD 11X100MML (ENDOMECHANICALS) IMPLANT
TROCAR XCEL NON-BLD 5MMX100MML (ENDOMECHANICALS) ×2 IMPLANT
WATER STERILE IRR 1000ML POUR (IV SOLUTION) ×1 IMPLANT

## 2021-09-05 NOTE — Anesthesia Preprocedure Evaluation (Addendum)
Anesthesia Evaluation  ?Patient identified by MRN, date of birth, ID band ?Patient awake ? ? ? ?Reviewed: ?Allergy & Precautions, NPO status , Patient's Chart, lab work & pertinent test results ? ?History of Anesthesia Complications ?Negative for: history of anesthetic complications ? ?Airway ?Mallampati: II ? ?TM Distance: >3 FB ?Neck ROM: Full ? ? ? Dental ? ?(+) Edentulous Upper, Edentulous Lower ?  ?Pulmonary ?sleep apnea and Continuous Positive Airway Pressure Ventilation , COPD,  COPD inhaler, former smoker,  ?  ?Pulmonary exam normal ? ? ? ? ? ? ? Cardiovascular ?hypertension, + CAD, + Cardiac Stents (2000, 2016) and +CHF  ?Normal cardiovascular exam ? ?TTE 05/05/2021 (copy on chart): ??1. Left ventricular ejection fraction, by estimation, is 60 to 65%. The  ?left ventricle has normal function. The left ventricle has no regional  ?wall motion abnormalities. Left ventricular diastolic parameters are  ?consistent with Grade I diastolic  ?dysfunction (impaired relaxation).  ??2. Right ventricular systolic function is normal. The right ventricular  ?size is normal. Tricuspid regurgitation signal is inadequate for assessing  ?PA pressure.  ??3. Left atrial size was mildly dilated.  ??4. The mitral valve is grossly normal. No evidence of mitral valve  ?regurgitation.  ??5. The aortic valve is grossly normal. Aortic valve regurgitation is not  ?visualized.  ??6. The inferior vena cava IVC not well visualized.  ?? ?Nuclear stress 08/01/2020 (copy on chart): ?Findings: ?1.  Gated images show overall ejection fraction of 59%. ?2.  Small apical septal defect at rest and stress. ?3.  No significant reversible ischemia was noted. ? ?Impression: ?Overall normal ejection fraction of 59% with small region of fixed defect noted in the apical septum both at rest and stress. ?  ?Neuro/Psych ?negative neurological ROS ? negative psych ROS  ? GI/Hepatic ?negative GI ROS, Neg liver ROS,    ?Endo/Other  ?negative endocrine ROS ? Renal/GU ?Renal InsufficiencyRenal disease  ?negative genitourinary ?  ?Musculoskeletal ? ?(+) Arthritis ,  ? Abdominal ?  ?Peds ? Hematology ?negative hematology ROS ?(+)   ?Anesthesia Other Findings ?Day of surgery medications reviewed with patient. ? Reproductive/Obstetrics ?negative OB ROS ? ?  ? ? ? ? ? ? ? ? ? ? ? ? ? ?  ?  ? ? ? ? ? ? ? ?Anesthesia Physical ?Anesthesia Plan ? ?ASA: 3 ? ?Anesthesia Plan: General  ? ?Post-op Pain Management: Tylenol PO (pre-op)*  ? ?Induction: Intravenous ? ?PONV Risk Score and Plan: 3 and Midazolam, Treatment may vary due to age or medical condition, Ondansetron and Dexamethasone ? ?Airway Management Planned: Oral ETT ? ?Additional Equipment: None ? ?Intra-op Plan:  ? ?Post-operative Plan: Extubation in OR ? ?Informed Consent: I have reviewed the patients History and Physical, chart, labs and discussed the procedure including the risks, benefits and alternatives for the proposed anesthesia with the patient or authorized representative who has indicated his/her understanding and acceptance.  ? ? ? ?Dental advisory given ? ?Plan Discussed with: CRNA ? ?Anesthesia Plan Comments:   ? ? ? ? ? ?Anesthesia Quick Evaluation ? ?

## 2021-09-05 NOTE — H&P (Signed)
? ? ?  Randall Montoya is an 71 y.o. male.  ? ?HPI: [name] with ventral hernia. He had a fall 09/02/21 and has been having LLE pain. The patient has had no hospitalizations, doctors visits, ER visits, surgeries, or newly diagnosed allergies since being seen in the office.  ? ? ?Past Medical History:  ?Diagnosis Date  ? Arthritis   ? Brain syndrome   ? Cataract   ? Congestive heart failure (CHF) ([place])   ? COPD (chronic obstructive pulmonary disease) ([place])   ? Dyspnea   ? Hyperlipidemia   ? Hypertension   ? Kidney failure   ? Prediabetes   ? Sleep apnea   ? ? ?Past Surgical History:  ?Procedure Laterality Date  ? heart stent    ? myocardial infarction    ? ? ?History reviewed. No pertinent family history. ? ?Social History:  reports that he has quit smoking. His smoking use included cigarettes. He has never used smokeless tobacco. He reports that he does not currently use alcohol. He reports that he does not use drugs. ? ?Allergies: No Known Allergies ? ?Medications: I have reviewed the patient's current medications. ? ?No results found for this or any previous visit (from the past 48 hour(s)). ? ?No results found. ? ?ROS ?10 point review of systems is negative except as listed above in HPI.  ? ?Physical Exam ?Blood pressure (!) 152/82, pulse 97, temperature 98.6 ?F (37 ?C), temperature source Oral, resp. rate 17, height '5\' 9"'$  (1.753 m), weight 106.6 kg, SpO2 95 %. ?Constitutional: well-developed, well-nourished ?HEENT: pupils equal, round, reactive to light, 97m b/l, moist conjunctiva, external inspection of ears and nose normal, hearing intact ?Oropharynx: normal oropharyngeal mucosa, normal dentition ?Neck: no thyromegaly, trachea midline, no midline cervical tenderness to palpation ?Chest: breath sounds equal bilaterally, normal respiratory effort, no midline or lateral chest wall tenderness to palpation/deformity ?Abdomen: soft, NT, no bruising, no hepatosplenomegaly, ventral hernia ?GU: no blood at urethral meatus of  penis, no scrotal masses or abnormality  ?Back: no wounds, no thoracic/lumbar spine tenderness to palpation, no thoracic/lumbar spine stepoffs ?Rectal: deferred ?Extremities: 2+ radial and pedal pulses bilaterally, intact motor and sensation bilateral UE and LE, no peripheral edema, swelling and tenderness of LLE laterally ?MSK: unable to assess gait/station, no clubbing/cyanosis of fingers/toes, normal ROM of all four extremities ?Skin: warm, dry, no rashes ?Psych: normal memory, normal mood/affect  ? ?  ?Assessment/Plan: ?[name] with VH, plan for lap VHR with mesh. LLE swelling, will get pre-op XR. Informed consent was obtained after detailed explanation of risks, including bleeding, infection, hematoma/seroma, temporary or permanent neuropathy, hernia recurrence, and mesh infection requiring explantation. All questions answered to the patient's satisfaction. ? ? ?AJesusita Oka MD ?General and Trauma Surgery ?[facility] ? ? ? ? ? ? ?

## 2021-09-05 NOTE — Transfer of Care (Signed)
Immediate Anesthesia Transfer of Care Note ? ?Patient: VARDAAN DEPASCALE ? ?Procedure(s) Performed: LAPAROSCOPIC VENTRAL HERNIA REPAIR (Abdomen) ?INSERTION OF MESH (Abdomen) ? ?Patient Location: PACU ? ?Anesthesia Type:General ? ?Level of Consciousness: awake, drowsy, patient cooperative and responds to stimulation ? ?Airway & Oxygen Therapy: Patient Spontanous Breathing and Patient connected to face mask oxygen ? ?Post-op Assessment: Report given to RN, Post -op Vital signs reviewed and stable and Patient moving all extremities X 4 ? ?Post vital signs: Reviewed and stable ? ?Last Vitals:  ?Vitals Value Taken Time  ?BP    ?Temp    ?Pulse 72 09/05/21 1603  ?Resp 21 09/05/21 1603  ?SpO2 97 % 09/05/21 1603  ?Vitals shown include unvalidated device data. ? ?Last Pain:  ?Vitals:  ? 09/05/21 1209  ?TempSrc:   ?PainSc: 0-No pain  ?   ? ?  ? ?Complications: No notable events documented. ?

## 2021-09-05 NOTE — Anesthesia Procedure Notes (Signed)
Procedure Name: Intubation ?Date/Time: 09/05/2021 2:22 PM ?Performed by: Cathren Harsh, CRNA ?Pre-anesthesia Checklist: Patient identified, Emergency Drugs available, Suction available and Patient being monitored ?Patient Re-evaluated:Patient Re-evaluated prior to induction ?Oxygen Delivery Method: Circle System Utilized ?Preoxygenation: Pre-oxygenation with 100% oxygen ?Induction Type: IV induction ?Ventilation: Two handed mask ventilation required and Oral airway inserted - appropriate to patient size ?Laryngoscope Size: Mac and 4 ?Grade View: Grade I ?Tube type: Oral ?Tube size: 7.5 mm ?Number of attempts: 1 ?Airway Equipment and Method: Stylet and Oral airway ?Placement Confirmation: ETT inserted through vocal cords under direct vision, positive ETCO2 and breath sounds checked- equal and bilateral ?Secured at: 23 cm ?Tube secured with: Tape ?Dental Injury: Teeth and Oropharynx as per pre-operative assessment  ? ? ? ? ?

## 2021-09-05 NOTE — Progress Notes (Signed)
Pt did not take his am dose of Metoprolol 50 mg at home this morning. BP152/82,pulse 97. Dr. Daiva Huge notified. Received order for pt to take 50 mg Metoprolol po  now. Medication administered per order. ?

## 2021-09-05 NOTE — Op Note (Signed)
?  ?  Operative Note ?  ?  ?Date: 09/05/2021 ?  ?Procedure: laparoscopic ventral hernia repair with mesh ?  ?Pre-op diagnosis: incarcerated ventral hernia ?Post-op diagnosis: same ?  ?Indication and clinical history: 46M with an incarcerated ventral hernia   ?  ?Surgeon: Jesusita Oka, MD ?Assistant: William Hamburger, MD, PGY5 ?  ?Anesthesiologist: Daiva Huge, MD ?Anesthesia: General ?  ?Findings:  ?Specimen: none ?EBL: <5cc ?Drains/Implants: 4.5in ventralight permanent mesh ?  ?Disposition: PACU - hemodynamically stable. ?  ?Description of procedure: The patient was positioned supine on the operating room table. General anesthetic induction and intubation were uneventful. Time-out was performed verifying correct patient, procedure, signature of informed consent, and administration of pre-operative antibiotics. The abdomen was prepped and draped in the usual sterile fashion, including the use of an ioban drape.  ?  ?A Veress needle was used in the left upper quadrant to insufflate the abdomen and the abdomen entered using a 32m port and an optiview technique. No injury to any intra-abdominal structure was identified. Two additional 569mports were placed in the left abdomen under direct visualization and after administration of local anesthetic. The incarcerated fat was removed and the hernia defect closed using a figure of eight zero vicryl suture. A 4.5in circular mesh was labeled in four quadrants, #1 novofil suture secured to it in four quadrants, and was inserted into the abdomen. After orientation in the abdomen, a suture passer was used to secure the mesh in four quadrants and a 31m38mbsorbable tacker used to circumferentially secure it. The skin of all port sites were closed with 4-0 monocryl suture. All wounds were dressed with dermabond as sterile dressing.  ?  ?All sponge and instrument counts were correct at the conclusion of the procedure. The patient was awakened from anesthesia, extubated uneventfully, and transported  to the PACU in good condition. There were no complications.  ?  ?  ?  ?AyeJesusita OkaD ?General and Trauma Surgery ?[facility] ? ?

## 2021-09-06 ENCOUNTER — Encounter (HOSPITAL_COMMUNITY): Payer: Self-pay | Admitting: Surgery

## 2021-09-06 LAB — BASIC METABOLIC PANEL
Anion gap: 10 (ref 5–15)
BUN: 26 mg/dL — ABNORMAL HIGH (ref 8–23)
CO2: 22 mmol/L (ref 22–32)
Calcium: 8.9 mg/dL (ref 8.9–10.3)
Chloride: 104 mmol/L (ref 98–111)
Creatinine, Ser: 1.48 mg/dL — ABNORMAL HIGH (ref 0.61–1.24)
GFR, Estimated: 51 mL/min — ABNORMAL LOW (ref >60)
Glucose, Bld: 101 mg/dL — ABNORMAL HIGH (ref 70–99)
Potassium: 4.6 mmol/L (ref 3.5–5.1)
Sodium: 136 mmol/L (ref 135–145)

## 2021-09-06 LAB — CBC
HCT: 42.7 % (ref 39.0–52.0)
Hemoglobin: 13.9 g/dL (ref 13.0–17.0)
MCH: 29 pg (ref 26.0–34.0)
MCHC: 32.6 g/dL (ref 30.0–36.0)
MCV: 89.1 fL (ref 80.0–100.0)
Platelets: 181 10*3/uL (ref 150–400)
RBC: 4.79 MIL/uL (ref 4.22–5.81)
RDW: 15 % (ref 11.5–15.5)
WBC: 10.3 10*3/uL (ref 4.0–10.5)
nRBC: 0 % (ref 0.0–0.2)

## 2021-09-06 MED ORDER — GABAPENTIN 100 MG PO CAPS
100.0000 mg | ORAL_CAPSULE | Freq: Two times a day (BID) | ORAL | Status: DC
  Administered 2021-09-06 – 2021-09-11 (×12): 100 mg via ORAL
  Filled 2021-09-06 (×12): qty 1

## 2021-09-06 MED ORDER — TAMSULOSIN HCL 0.4 MG PO CAPS
0.4000 mg | ORAL_CAPSULE | Freq: Every day | ORAL | Status: DC
  Administered 2021-09-06 – 2021-09-11 (×6): 0.4 mg via ORAL
  Filled 2021-09-06 (×5): qty 1

## 2021-09-06 MED ORDER — COLCHICINE 0.6 MG PO TABS
0.6000 mg | ORAL_TABLET | Freq: Every day | ORAL | Status: DC
  Administered 2021-09-06 – 2021-09-11 (×6): 0.6 mg via ORAL
  Filled 2021-09-06 (×7): qty 1

## 2021-09-06 MED ORDER — LISINOPRIL 20 MG PO TABS
40.0000 mg | ORAL_TABLET | Freq: Every day | ORAL | Status: DC
  Administered 2021-09-06 – 2021-09-11 (×6): 40 mg via ORAL
  Filled 2021-09-06 (×6): qty 2

## 2021-09-06 MED ORDER — SENNA 8.6 MG PO TABS
2.0000 | ORAL_TABLET | Freq: Every day | ORAL | Status: DC
  Administered 2021-09-06 – 2021-09-09 (×4): 17.2 mg via ORAL
  Filled 2021-09-06 (×4): qty 2

## 2021-09-06 MED ORDER — BUMETANIDE 2 MG PO TABS
2.0000 mg | ORAL_TABLET | Freq: Every day | ORAL | Status: DC
  Administered 2021-09-06 – 2021-09-11 (×6): 2 mg via ORAL
  Filled 2021-09-06 (×7): qty 1

## 2021-09-06 MED ORDER — METOPROLOL TARTRATE 50 MG PO TABS
50.0000 mg | ORAL_TABLET | Freq: Two times a day (BID) | ORAL | Status: DC
  Administered 2021-09-06 – 2021-09-11 (×11): 50 mg via ORAL
  Filled 2021-09-06 (×12): qty 1

## 2021-09-06 MED ORDER — AMLODIPINE BESYLATE 10 MG PO TABS
10.0000 mg | ORAL_TABLET | Freq: Every day | ORAL | Status: DC
Start: 2021-09-06 — End: 2021-09-12
  Administered 2021-09-06 – 2021-09-11 (×6): 10 mg via ORAL
  Filled 2021-09-06 (×6): qty 1

## 2021-09-06 MED ORDER — ALBUTEROL SULFATE HFA 108 (90 BASE) MCG/ACT IN AERS: 1.0000 | INHALATION_SPRAY | Freq: Four times a day (QID) | RESPIRATORY_TRACT | Status: DC | PRN

## 2021-09-06 MED ORDER — UMECLIDINIUM BROMIDE 62.5 MCG/ACT IN AEPB
1.0000 | INHALATION_SPRAY | Freq: Every day | RESPIRATORY_TRACT | Status: DC
  Administered 2021-09-07 – 2021-09-11 (×4): 1 via RESPIRATORY_TRACT
  Filled 2021-09-06 (×2): qty 7

## 2021-09-06 MED ORDER — POLYETHYLENE GLYCOL 3350 17 G PO PACK
17.0000 g | PACK | Freq: Two times a day (BID) | ORAL | Status: DC
  Administered 2021-09-06 – 2021-09-08 (×5): 17 g via ORAL
  Filled 2021-09-06 (×5): qty 1

## 2021-09-06 MED ORDER — OXYBUTYNIN CHLORIDE ER 5 MG PO TB24
5.0000 mg | ORAL_TABLET | Freq: Every day | ORAL | Status: DC
  Administered 2021-09-06 – 2021-09-11 (×6): 5 mg via ORAL
  Filled 2021-09-06 (×7): qty 1

## 2021-09-06 MED ORDER — ATORVASTATIN CALCIUM 80 MG PO TABS
80.0000 mg | ORAL_TABLET | Freq: Every day | ORAL | Status: DC
  Administered 2021-09-06 – 2021-09-11 (×6): 80 mg via ORAL
  Filled 2021-09-06 (×6): qty 1

## 2021-09-06 MED ORDER — ALBUTEROL SULFATE (2.5 MG/3ML) 0.083% IN NEBU: 2.5000 mg | INHALATION_SOLUTION | RESPIRATORY_TRACT | Status: DC | PRN

## 2021-09-06 MED ORDER — FLUTICASONE FUROATE-VILANTEROL 100-25 MCG/ACT IN AEPB
1.0000 | INHALATION_SPRAY | Freq: Every day | RESPIRATORY_TRACT | Status: DC
  Administered 2021-09-07 – 2021-09-11 (×4): 1 via RESPIRATORY_TRACT
  Filled 2021-09-06 (×2): qty 28

## 2021-09-06 MED ORDER — PANTOPRAZOLE SODIUM 40 MG PO TBEC
40.0000 mg | DELAYED_RELEASE_TABLET | Freq: Every day | ORAL | Status: DC
  Administered 2021-09-06 – 2021-09-11 (×6): 40 mg via ORAL
  Filled 2021-09-06 (×6): qty 1

## 2021-09-06 MED ORDER — MAGNESIUM HYDROXIDE 400 MG/5ML PO SUSP
30.0000 mL | Freq: Every day | ORAL | Status: DC
  Administered 2021-09-06 – 2021-09-08 (×3): 30 mL via ORAL
  Filled 2021-09-06 (×3): qty 30

## 2021-09-06 NOTE — Progress Notes (Signed)
Mobility Specialist Progress Note: ? ? 09/06/21 1110  ?Mobility  ?Activity Refused mobility  ? ?Pt in a lot of pain and wanting to talk to doctor. Will follow-up as time allows.  ? ?Aerika Groll ?Mobility Specialist ?Primary Phone [phone] ? ?

## 2021-09-06 NOTE — TOC Progression Note (Addendum)
Transition of Care (TOC) - Progression Note  ? ? ?Patient Details  ?Name: Randall Montoya ?MRN: [id number] ?Date of Birth: 1951/04/26 ? ?Transition of Care (TOC) CM/SW Contact  ?Marilu Favre, RN ?Phone Number: ?09/06/2021, 2:13 PM ? ?Clinical Narrative:    ? ?Patient from home alone. Confirmed address. Patient requesting wheel chair for home. Explained MD has ordered PT/OT evaluations will await recommendation and MD order. Patient requesting ambulance transport home. Explained if wheel chair ordered , ambulance will not transport any DME. Wheelchair will need to be delivered to home. Patient voiced understanding.  ? ?Await PT /Ot evaluations, orders. PTAR paperwork placed in patient's chart  ? ? He has a walker at home  ? ? Was told by nurse patient staying tonight  ? ?Expected Discharge Plan and Services ?  ?  ?  ?  ?  ?Expected Discharge Date: 09/06/21               ?  ?  ?  ?  ?  ?  ?  ?  ?  ?  ? ? ?Social Determinants of Health (SDOH) Interventions ?  ? ?Readmission Risk Interventions ?   ? View : No data to display.  ?  ?  ?  ? ? ?

## 2021-09-06 NOTE — Anesthesia Postprocedure Evaluation (Signed)
Anesthesia Post Note ? ?Patient: Randall Montoya ? ?Procedure(s) Performed: LAPAROSCOPIC VENTRAL HERNIA REPAIR (Abdomen) ?INSERTION OF MESH (Abdomen) ? ?  ? ?Patient location during evaluation: PACU ?Anesthesia Type: General ?Level of consciousness: awake and alert ?Pain management: pain level controlled ?Vital Signs Assessment: post-procedure vital signs reviewed and stable ?Respiratory status: spontaneous breathing, nonlabored ventilation and respiratory function stable ?Cardiovascular status: blood pressure returned to baseline ?Postop Assessment: no apparent nausea or vomiting ?Anesthetic complications: no ? ? ?No notable events documented. ?  ?  ?  ?  ?  ? ?Marthenia Rolling ? ? ? ? ?

## 2021-09-06 NOTE — Progress Notes (Signed)
? ?  General Surgery Follow Up Note ? ?Subjective:  ?  ?Overnight Issues:  ? ?Objective:  ?Vital signs for last 24 hours: ?Temp:  [98.1 ?F (36.7 ?C)-101 ?F (38.3 ?C)] 99.3 ?F (37.4 ?C) (04/19 1559) ?Pulse Rate:  [66-99] 81 (04/19 1559) ?Resp:  [13-19] 18 (04/19 1559) ?BP: (123-175)/(59-87) 147/59 (04/19 1559) ?SpO2:  [88 %-97 %] 93 % (04/19 1559) ? ?Hemodynamic parameters for last 24 hours: ?  ? ?Intake/Output from previous day: ?04/18 0701 - 04/19 0700 ?In: 500 [I.V.:500] ?Out: 410 [Urine:200; Stool:200; Blood:10]  ?Intake/Output this shift: ?Total I/O ?In: -  ?Out: 350 [Urine:350] ? ?Vent settings for last 24 hours: ?  ? ?Physical Exam:  ?Gen: comfortable, no distress ?Neuro: non-focal exam ?HEENT: PERRL ?Neck: supple ?CV: RRR ?Pulm: unlabored breathing ?Abd: soft, distended, appropriately TTP, incisions c/d/i ?GU: clear yellow urine ?Extr: wwp, no edema ? ? ?Results for orders placed or performed during the hospital encounter of 09/05/21 (from the past 24 hour(s))  ?Basic metabolic panel     Status: Abnormal  ? Collection Time: 09/06/21  1:40 AM  ?Result Value Ref Range  ? Sodium 136 135 - 145 mmol/L  ? Potassium 4.6 3.5 - 5.1 mmol/L  ? Chloride 104 98 - 111 mmol/L  ? CO2 22 22 - 32 mmol/L  ? Glucose, Bld 101 (H) 70 - 99 mg/dL  ? BUN 26 (H) 8 - 23 mg/dL  ? Creatinine, Ser 1.48 (H) 0.61 - 1.24 mg/dL  ? Calcium 8.9 8.9 - 10.3 mg/dL  ? GFR, Estimated 51 (L) >60 mL/min  ? Anion gap 10 5 - 15  ?CBC     Status: None  ? Collection Time: 09/06/21  4:43 AM  ?Result Value Ref Range  ? WBC 10.3 4.0 - 10.5 K/uL  ? RBC 4.79 4.22 - 5.81 MIL/uL  ? Hemoglobin 13.9 13.0 - 17.0 g/dL  ? HCT 42.7 39.0 - 52.0 %  ? MCV 89.1 80.0 - 100.0 fL  ? MCH 29.0 26.0 - 34.0 pg  ? MCHC 32.6 30.0 - 36.0 g/dL  ? RDW 15.0 11.5 - 15.5 %  ? Platelets 181 150 - 400 K/uL  ? nRBC 0.0 0.0 - 0.2 %  ? ? ?Assessment & Plan: ? ?Present on Admission: ?**None** ? ? ? LOS: 0 days  ? ?Additional comments:I reviewed the patient's new clinical lab test results.    and I reviewed the patients new imaging test results.   ? ?VH - s/p lap VHR with mesh 4/18, Expected post-op course ?LLE pain - s/p fall 4/16, XR done pre-op and negative, PT/OT eval  ?FEN - reg diet, add bowel regimen ?DVT - SCDs, LMWH ?Dispo - home when cleared by PT  ? ? ?Jesusita Oka, MD ?Trauma & General Surgery ?Please use [email or web address] to contact on call provider ? ?09/06/2021 ? ?*Care during the described time interval was provided by me. I have reviewed this patient's available data, including medical history, events of note, physical examination and test results as part of my evaluation. ? ?

## 2021-09-06 NOTE — Evaluation (Signed)
Occupational Therapy Evaluation ?Patient Details ?Name: Randall Montoya ?MRN: [id number] ?DOB: 1950-11-17 ?Today's Date: 09/06/2021 ? ? ?History of Present Illness Pt is a 71 y/o M presenting with ventral hernia, had a fall on 09/02/2021 and has been having LLE pain. S/p laparoscopic ventral hernia repair with mesh on 4/18. PMH includes arthritis, brain syndrome, cataracts, CHF, COPD, dyspnea, HLD, HTN, pre-diabetes, and OSA on CPAP.  ? ?Clinical Impression ?  ?PTA, pt reports independence with ADLs, uses rollator for functional mobility. Pt lives in recovery house with others. Pt  currently min-mod A for ADLs, increased assistance needed for LB ADLs as pt having difficulty reaching towards feet or using compensatory strategies to complete/simulate LB dressing. Pt min -mod A +2 for bed mobility using log rolling technique for comfort. Pt mod A +2 for step pivot transfer with rollator, benefits from elevated surface. Pt presenting with impairments listed below, will follow acutely. Recommend HHOT at d/c pending pt progress. ?   ? ?Recommendations for follow up therapy are one component of a multi-disciplinary discharge planning process, led by the attending physician.  Recommendations may be updated based on patient status, additional functional criteria and insurance authorization.  ? ?Follow Up Recommendations ? Home health OT  ?  ?Assistance Recommended at Discharge Intermittent Supervision/Assistance  ?Patient can return home with the following A lot of help with walking and/or transfers;A lot of help with bathing/dressing/bathroom;Assistance with cooking/housework;Assist for transportation;Help with stairs or ramp for entrance;Direct supervision/assist for medications management;Direct supervision/assist for financial management ? ?  ?Functional Status Assessment ? Patient has had a recent decline in their functional status and demonstrates the ability to make significant improvements in function in a reasonable and  predictable amount of time.  ?Equipment Recommendations ? Tub/shower seat  ?  ?Recommendations for Other Services   ? ? ?  ?Precautions / Restrictions Precautions ?Precautions: Fall ?Restrictions ?Weight Bearing Restrictions: No  ? ?  ? ?Mobility Bed Mobility ?Overal bed mobility: Needs Assistance ?Bed Mobility: Sidelying to Sit ?  ?Sidelying to sit: Mod assist, Min assist, +2 for physical assistance ?  ?  ?  ?General bed mobility comments: cues needed to scoot hips forward to sit EOB ?  ? ?Transfers ?Overall transfer level: Needs assistance ?Equipment used: Rollator (4 wheels) ?Transfers: Sit to/from Stand, Bed to chair/wheelchair/BSC ?Sit to Stand: Mod assist, +2 physical assistance ?  ?  ?Step pivot transfers: Mod assist, +2 physical assistance ?  ?  ?General transfer comment: benefits from elevated surface ?  ? ?  ?Balance Overall balance assessment: Needs assistance ?Sitting-balance support: Bilateral upper extremity supported, Feet supported ?Sitting balance-Leahy Scale: Fair ?Sitting balance - Comments: increased time needed to steady self once sitting upright EOB ?  ?Standing balance support: During functional activity, Reliant on assistive device for balance ?Standing balance-Leahy Scale: Poor ?Standing balance comment: reliant on external/Rollator support ?  ?  ?  ?  ?  ?  ?  ?  ?  ?  ?  ?   ? ?ADL either performed or assessed with clinical judgement  ? ?ADL Overall ADL's : Needs assistance/impaired ?Eating/Feeding: Set up;Sitting ?  ?Grooming: Set up;Sitting ?  ?Upper Body Bathing: Minimal assistance;Sitting ?  ?Lower Body Bathing: Moderate assistance;Sitting/lateral leans;Sit to/from stand ?  ?Upper Body Dressing : Minimal assistance;Sitting ?  ?Lower Body Dressing: Moderate assistance;Sitting/lateral leans;Sit to/from stand;Maximal assistance ?  ?Toilet Transfer: Minimal assistance;Moderate assistance;+2 for physical assistance ?Toilet Transfer Details (indicate cue type and reason): simulated to  chair ?Toileting- Water quality scientist  and Hygiene: Moderate assistance ?  ?  ?  ?Functional mobility during ADLs: Minimal assistance;Moderate assistance;Rollator (4 wheels);+2 for physical assistance ?   ? ? ? ?Vision   ?Vision Assessment?: No apparent visual deficits  ?   ?Perception   ?  ?Praxis   ?  ? ?Pertinent Vitals/Pain Pain Assessment ?Pain Assessment: Faces ?Pain Score: 8  ?Faces Pain Scale: Hurts whole lot ?Pain Location: LLE and abdomen with mobility ?Pain Descriptors / Indicators: Discomfort, Guarding, Grimacing ?Pain Intervention(s): Limited activity within patient's tolerance, Monitored during session, Repositioned  ? ? ? ?Hand Dominance   ?  ?Extremity/Trunk Assessment Upper Extremity Assessment ?Upper Extremity Assessment: Generalized weakness ?  ?Lower Extremity Assessment ?Lower Extremity Assessment: Defer to PT evaluation ?LLE Deficits / Details: Increased pain in L knee which limited ROM ?  ?  ?  ?Communication Communication ?Communication: No difficulties ?  ?Cognition Arousal/Alertness: Awake/alert ?Behavior During Therapy: [facility] for tasks assessed/performed ?Overall Cognitive Status: No family/caregiver present to determine baseline cognitive functioning ?  ?  ?  ?  ?  ?  ?  ?  ?  ?  ?  ?  ?  ?  ?  ?  ?General Comments: pt with decreased safety awareness/awareness of deficits ?  ?  ?General Comments  VSS on 2L O2 ? ?  ?Exercises   ?  ?Shoulder Instructions    ? ? ?Home Living Family/patient expects to be discharged to:: Other (Comment) (recovery house) ?Living Arrangements: Non-relatives/Friends ?Available Help at Discharge: Friend(s);Available 24 hours/day ?Type of Home: House ?Home Access: Stairs to enter ?Entrance Stairs-Number of Steps: 6 ?Entrance Stairs-Rails: None ?Home Layout: One level ?  ?  ?Bathroom Shower/Tub: Tub/shower unit ?  ?Bathroom Toilet: Standard ?  ?  ?Home Equipment: Rollator (4 wheels) ?  ?Additional Comments: does not wear O2 at baseline, states he is "on the border" ?   ? ?  ?Prior Functioning/Environment Prior Level of Function : Independent/Modified Independent ?  ?  ?  ?  ?  ?  ?Mobility Comments: Uses rollator ?  ?  ? ?  ?  ?OT Problem List: Decreased strength;Decreased range of motion;Impaired balance (sitting and/or standing);Decreased activity tolerance;Cardiopulmonary status limiting activity;Decreased safety awareness ?  ?   ?OT Treatment/Interventions: Self-care/ADL training;Therapeutic exercise;DME and/or AE instruction;Therapeutic activities;Patient/family education;Balance training  ?  ?OT Goals(Current goals can be found in the care plan section) Acute Rehab OT Goals ?Patient Stated Goal: to go home ?OT Goal Formulation: With patient ?Time For Goal Achievement: 09/20/21 ?Potential to Achieve Goals: Good ?ADL Goals ?Pt Will Perform Grooming: with set-up;standing;sitting ?Pt Will Perform Upper Body Dressing: with min guard assist;sitting;standing ?Pt Will Perform Lower Body Dressing: with mod assist;sit to/from stand;sitting/lateral leans ?Pt Will Transfer to Toilet: with min assist;squat pivot transfer;stand pivot transfer;bedside commode ?Pt Will Perform Tub/Shower Transfer: 3 in 1;rolling walker;Tub transfer;Shower transfer;ambulating  ?OT Frequency: Min 2X/week ?  ? ?Co-evaluation PT/OT/SLP Co-Evaluation/Treatment: Yes ?Reason for Co-Treatment: Complexity of the patient's impairments (multi-system involvement);For patient/therapist safety;To address functional/ADL transfers ?PT goals addressed during session: Mobility/safety with mobility;Balance ?OT goals addressed during session: ADL's and self-care;Proper use of Adaptive equipment and DME ?  ? ?  ?AM-PAC OT "6 Clicks" Daily Activity     ?Outcome Measure Help from another person eating meals?: None ?Help from another person taking care of personal grooming?: A Little ?Help from another person toileting, which includes using toliet, bedpan, or urinal?: A Lot ?Help from another person bathing (including washing,  rinsing, drying)?: A Lot ?Help from  another person to put on and taking off regular upper body clothing?: A Little ?Help from another person to put on and taking off regular lower body clothing?: A Lot ?6 Cl

## 2021-09-06 NOTE — Evaluation (Signed)
Physical Therapy Evaluation ?Patient Details ?Name: ABDULMALIK Montoya ?MRN: [id number] ?DOB: March 25, 1951 ?Today's Date: 09/06/2021 ? ?History of Present Illness ? Pt is a 71 y/o M presenting with ventral hernia, had a fall on 09/02/2021 and has been having LLE pain. S/p laparoscopic ventral hernia repair with mesh on 4/18. PMH includes arthritis, cataracts, CHF, COPD, dyspnea, HLD, HTN, pre-diabetes, and OSA on CPAP.  ?Clinical Impression ? Pt admitted secondary to problem above with deficits below. Pt with increased pain in abdomen and L knee which limited mobility. Required mod A for bed mobility, mod A + 2 to stand, and min A to transfer to chair. Pt currently lives in recovery home but reports roommates aren't there all the time. If pt progresses well, feel pt would benefit from HHPT. However, if pt does not progress well, may need to consider SNF. Will continue to follow acutely and update recommendations as pt progresses.  ?   ? ?Recommendations for follow up therapy are one component of a multi-disciplinary discharge planning process, led by the attending physician.  Recommendations may be updated based on patient status, additional functional criteria and insurance authorization. ? ?Follow Up Recommendations Home health PT (pending progression; may need SNF if doesn't progress well) ? ?  ?Assistance Recommended at Discharge Frequent or constant Supervision/Assistance  ?Patient can return home with the following ? A little help with walking and/or transfers;A little help with bathing/dressing/bathroom;Assistance with cooking/housework;Help with stairs or ramp for entrance;Assist for transportation ? ?  ?Equipment Recommendations Wheelchair (measurements PT);Wheelchair cushion (measurements PT)  ?Recommendations for Other Services ?    ?  ?Functional Status Assessment Patient has had a recent decline in their functional status and demonstrates the ability to make significant improvements in function in a reasonable  and predictable amount of time.  ? ?  ?Precautions / Restrictions Precautions ?Precautions: Fall ?Restrictions ?Weight Bearing Restrictions: No  ? ?  ? ?Mobility ? Bed Mobility ?Overal bed mobility: Needs Assistance ?Bed Mobility: Sidelying to Sit ?  ?Sidelying to sit: Mod assist, Min assist, +2 for physical assistance ?  ?  ?  ?General bed mobility comments: cues needed to scoot hips forward to sit EOB ?  ? ?Transfers ?Overall transfer level: Needs assistance ?Equipment used: Rollator (4 wheels) ?Transfers: Sit to/from Stand, Bed to chair/wheelchair/BSC ?Sit to Stand: Mod assist, +2 physical assistance ?  ?Step pivot transfers: Min assist, +2 safety/equipment ?  ?  ?  ?General transfer comment: Mod A +2 for lift assist and steadying and Min A for steadying to take steps to the chair. ?  ? ?Ambulation/Gait ?  ?  ?  ?  ?  ?  ?  ?  ? ?Stairs ?  ?  ?  ?  ?  ? ?Wheelchair Mobility ?  ? ?Modified Rankin (Stroke Patients Only) ?  ? ?  ? ?Balance Overall balance assessment: Needs assistance ?Sitting-balance support: Bilateral upper extremity supported, Feet supported ?Sitting balance-Leahy Scale: Fair ?  ?  ?Standing balance support: During functional activity, Reliant on assistive device for balance ?Standing balance-Leahy Scale: Poor ?Standing balance comment: reliant on external/Rollator support ?  ?  ?  ?  ?  ?  ?  ?  ?  ?  ?  ?   ? ? ? ?Pertinent Vitals/Pain Pain Assessment ?Pain Assessment: Faces ?Faces Pain Scale: Hurts whole lot ?Pain Location: LLE and abdomen with mobility ?Pain Descriptors / Indicators: Discomfort, Guarding, Grimacing ?Pain Intervention(s): Limited activity within patient's tolerance, Monitored during session, Repositioned  ? ? ?  Home Living Family/patient expects to be discharged to:: Other (Comment) (recovery house) ?Living Arrangements: Non-relatives/Friends ?Available Help at Discharge: Friend(s);Available 24 hours/day ?Type of Home: House ?Home Access: Stairs to enter ?Entrance Stairs-Rails:  None ?Entrance Stairs-Number of Steps: 6 ?  ?Home Layout: One level ?Home Equipment: Rollator (4 wheels) ?Additional Comments: does not wear O2 at baseline, states he is "on the border"  ?  ?Prior Function Prior Level of Function : Independent/Modified Independent ?  ?  ?  ?  ?  ?  ?Mobility Comments: Uses rollator ?  ?  ? ? ?Hand Dominance  ?   ? ?  ?Extremity/Trunk Assessment  ? Upper Extremity Assessment ?Upper Extremity Assessment: Defer to OT evaluation ?  ? ?Lower Extremity Assessment ?Lower Extremity Assessment: LLE deficits/detail ?LLE Deficits / Details: Increased pain in L knee which limited ROM ?  ? ?   ?Communication  ? Communication: No difficulties  ?Cognition Arousal/Alertness: Awake/alert ?Behavior During Therapy: [facility] for tasks assessed/performed ?Overall Cognitive Status: No family/caregiver present to determine baseline cognitive functioning ?  ?  ?  ?  ?  ?  ?  ?  ?  ?  ?  ?  ?  ?  ?  ?  ?General Comments: pt with decreased safety awareness/awareness of deficits ?  ?  ? ?  ?General Comments General comments (skin integrity, edema, etc.): Educated about importance of mobility and ambulation throughout stay. ? ?  ?Exercises    ? ?Assessment/Plan  ?  ?PT Assessment Patient needs continued PT services  ?PT Problem List Decreased strength;Decreased activity tolerance;Decreased balance;Decreased mobility;Decreased safety awareness;Cardiopulmonary status limiting activity;Decreased knowledge of precautions;Pain ? ?   ?  ?PT Treatment Interventions DME instruction;Stair training;Gait training;Therapeutic activities;Functional mobility training;Balance training;Therapeutic exercise;Patient/family education   ? ?PT Goals (Current goals can be found in the Care Plan section)  ?Acute Rehab PT Goals ?Patient Stated Goal: to go home ?PT Goal Formulation: With patient ?Time For Goal Achievement: 09/20/21 ?Potential to Achieve Goals: Fair ? ?  ?Frequency Min 3X/week ?  ? ? ?Co-evaluation PT/OT/SLP  Co-Evaluation/Treatment: Yes ?Reason for Co-Treatment: For patient/therapist safety;To address functional/ADL transfers ?PT goals addressed during session: Mobility/safety with mobility;Balance ?  ?  ? ? ?  ?AM-PAC PT "6 Clicks" Mobility  ?Outcome Measure Help needed turning from your back to your side while in a flat bed without using bedrails?: A Little ?Help needed moving from lying on your back to sitting on the side of a flat bed without using bedrails?: A Lot ?Help needed moving to and from a bed to a chair (including a wheelchair)?: A Little ?Help needed standing up from a chair using your arms (e.g., wheelchair or bedside chair)?: Total ?Help needed to walk in hospital room?: A Little ?Help needed climbing 3-5 steps with a railing? : A Lot ?6 Click Score: 14 ? ?  ?End of Session Equipment Utilized During Treatment: Gait belt ?Activity Tolerance: Patient limited by pain ?Patient left: in chair;with call bell/phone within reach ?Nurse Communication: Mobility status ?PT Visit Diagnosis: Other abnormalities of gait and mobility (R26.89);Pain;Difficulty in walking, not elsewhere classified (R26.2) ?Pain - part of body:  (abdomen) ?  ? ?Time: 5400-8676 ?PT Time Calculation (min) (ACUTE ONLY): 23 min ? ? ?Charges:   PT Evaluation ?$PT Eval Moderate Complexity: 1 Mod ?  ?  ?   ? ? ?Reuel Derby, PT, DPT  ?Acute Rehabilitation Services  ?Pager: [phone] ?Office: [phone] ? ? ?[place] ?09/06/2021, 3:19 PM ? ?

## 2021-09-07 DIAGNOSIS — W19XXXA Unspecified fall, initial encounter: Secondary | ICD-10-CM | POA: Diagnosis present

## 2021-09-07 DIAGNOSIS — R1310 Dysphagia, unspecified: Secondary | ICD-10-CM | POA: Diagnosis not present

## 2021-09-07 DIAGNOSIS — Z7902 Long term (current) use of antithrombotics/antiplatelets: Secondary | ICD-10-CM | POA: Diagnosis not present

## 2021-09-07 DIAGNOSIS — K2100 Gastro-esophageal reflux disease with esophagitis, without bleeding: Secondary | ICD-10-CM | POA: Diagnosis not present

## 2021-09-07 DIAGNOSIS — I252 Old myocardial infarction: Secondary | ICD-10-CM | POA: Diagnosis not present

## 2021-09-07 DIAGNOSIS — Z9889 Other specified postprocedural states: Secondary | ICD-10-CM | POA: Diagnosis not present

## 2021-09-07 DIAGNOSIS — R7303 Prediabetes: Secondary | ICD-10-CM | POA: Diagnosis present

## 2021-09-07 DIAGNOSIS — Z87891 Personal history of nicotine dependence: Secondary | ICD-10-CM | POA: Diagnosis not present

## 2021-09-07 DIAGNOSIS — Z6834 Body mass index (BMI) 34.0-34.9, adult: Secondary | ICD-10-CM | POA: Diagnosis not present

## 2021-09-07 DIAGNOSIS — I130 Hypertensive heart and chronic kidney disease with heart failure and stage 1 through stage 4 chronic kidney disease, or unspecified chronic kidney disease: Secondary | ICD-10-CM | POA: Diagnosis present

## 2021-09-07 DIAGNOSIS — R066 Hiccough: Secondary | ICD-10-CM | POA: Diagnosis present

## 2021-09-07 DIAGNOSIS — I5032 Chronic diastolic (congestive) heart failure: Secondary | ICD-10-CM | POA: Diagnosis present

## 2021-09-07 DIAGNOSIS — J449 Chronic obstructive pulmonary disease, unspecified: Secondary | ICD-10-CM | POA: Diagnosis present

## 2021-09-07 DIAGNOSIS — I2510 Atherosclerotic heart disease of native coronary artery without angina pectoris: Secondary | ICD-10-CM | POA: Diagnosis present

## 2021-09-07 DIAGNOSIS — M79605 Pain in left leg: Secondary | ICD-10-CM | POA: Diagnosis present

## 2021-09-07 DIAGNOSIS — Z955 Presence of coronary angioplasty implant and graft: Secondary | ICD-10-CM | POA: Diagnosis not present

## 2021-09-07 DIAGNOSIS — K436 Other and unspecified ventral hernia with obstruction, without gangrene: Secondary | ICD-10-CM | POA: Diagnosis present

## 2021-09-07 DIAGNOSIS — K3189 Other diseases of stomach and duodenum: Secondary | ICD-10-CM | POA: Diagnosis not present

## 2021-09-07 DIAGNOSIS — N189 Chronic kidney disease, unspecified: Secondary | ICD-10-CM | POA: Diagnosis present

## 2021-09-07 DIAGNOSIS — Z79899 Other long term (current) drug therapy: Secondary | ICD-10-CM | POA: Diagnosis not present

## 2021-09-07 DIAGNOSIS — G4730 Sleep apnea, unspecified: Secondary | ICD-10-CM | POA: Diagnosis present

## 2021-09-07 DIAGNOSIS — K760 Fatty (change of) liver, not elsewhere classified: Secondary | ICD-10-CM | POA: Diagnosis present

## 2021-09-07 DIAGNOSIS — E785 Hyperlipidemia, unspecified: Secondary | ICD-10-CM | POA: Diagnosis present

## 2021-09-07 DIAGNOSIS — R12 Heartburn: Secondary | ICD-10-CM | POA: Diagnosis not present

## 2021-09-07 DIAGNOSIS — Z7982 Long term (current) use of aspirin: Secondary | ICD-10-CM | POA: Diagnosis not present

## 2021-09-07 DIAGNOSIS — G4733 Obstructive sleep apnea (adult) (pediatric): Secondary | ICD-10-CM | POA: Diagnosis present

## 2021-09-07 DIAGNOSIS — E669 Obesity, unspecified: Secondary | ICD-10-CM | POA: Diagnosis present

## 2021-09-07 MED ORDER — PROCHLORPERAZINE EDISYLATE 10 MG/2ML IJ SOLN
10.0000 mg | Freq: Once | INTRAMUSCULAR | Status: AC
  Administered 2021-09-08: 10 mg via INTRAVENOUS
  Filled 2021-09-07: qty 2

## 2021-09-07 MED ORDER — BISACODYL 5 MG PO TBEC
10.0000 mg | DELAYED_RELEASE_TABLET | Freq: Every day | ORAL | Status: DC
  Administered 2021-09-07 – 2021-09-09 (×3): 10 mg via ORAL
  Filled 2021-09-07 (×3): qty 2

## 2021-09-07 MED ORDER — BISACODYL 10 MG RE SUPP
10.0000 mg | Freq: Once | RECTAL | Status: DC
Start: 2021-09-07 — End: 2021-09-10

## 2021-09-07 NOTE — Care Management Obs Status (Signed)
MEDICARE OBSERVATION STATUS NOTIFICATION ? ? ?Patient Details  ?Name: Randall Montoya ?MRN: [id number] ?Date of Birth: 1951-02-10 ? ? ?Medicare Observation Status Notification Given:  Yes ? ?Permission to sign over phone due to remote ? ?Verdell Carmine, RN ?09/07/2021, 9:27 AM ?

## 2021-09-07 NOTE — Progress Notes (Signed)
SATURATION QUALIFICATIONS: (This note is used to comply with regulatory documentation for home oxygen) ? ?Patient Saturations on Room Air at Rest = 87% ? ?Patient Saturations on Room Air while Ambulating = NT due to drop at rest ? ?Patient Saturations on 3 Liters of oxygen while Ambulating = 91% ? ?Please briefly explain why patient needs home oxygen: Pt requires supplemental oxygen to maintain SpO2 >/88% ? ?Mabeline Caras, PT, DPT ?Acute Rehabilitation Services  ?Pager [phone] ?Office [phone] ? ?

## 2021-09-07 NOTE — TOC Progression Note (Addendum)
Transition of Care (TOC) - Progression Note  ? ? ?Patient Details  ?Name: Randall Montoya ?MRN: [id number] ?Date of Birth: 25-Aug-1950 ? ?Transition of Care (TOC) CM/SW Contact  ?Marilu Favre, RN ?Phone Number: ?09/07/2021, 10:34 AM ? ?Clinical Narrative:    ? ?See yesterday note. Patient will need [facility] transport home. [facility] will not transport DME home , DME will need to be delivered to home. Patient aware.  ? ?NCM ordered wheel chair and tub / shower seat with Lacresia with [facility]. Message MD regarding oxygenation note. If home oxygen needed with need MD order.  ? ? Recommendations for HHPT/OT. ? ?Amy with [facility] cannot accept.  ? ?Anderson Malta with [facility] reviewing referral.[facility] cannot accept. ? ?Marjory Lies with [facility] accepted , will need home health orders and signed face to face  ? ?  ? ?Expected Discharge Plan and Services ?  ?  ?  ?  ?  ?Expected Discharge Date: 09/06/21               ?  ?  ?  ?  ?  ?  ?  ?  ?  ?  ? ? ?Social Determinants of Health (SDOH) Interventions ?  ? ?Readmission Risk Interventions ?   ? View : No data to display.  ?  ?  ?  ? ? ?

## 2021-09-07 NOTE — Progress Notes (Signed)
Occupational Therapy Treatment ?Patient Details ?Name: Randall Montoya ?MRN: [id number] ?DOB: 17-Sep-1950 ?Today's Date: 09/07/2021 ? ? ?History of present illness Pt is a 71 y/o male admitted 09/05/21 presenting with ventral hernia; pt also endorses fall on 4/15 with LLE pain, workup negative for acute injury. S/p laparoscopic ventral hernia repair with mesh on 4/18. PMH includes arthritis, brain syndrome, cataracts, CHF, COPD, dyspnea, HLD, HTN, pre-diabetes, OSA on CPAP. ?  ?OT comments ? Patient received up in recliner. Patient was able to perform grooming and UB bathing with setup while seated in recliner and increased time. Patient required min assist to change gown. Patient was min assist to transfer back to bed due to complaints of pain and required assistance with BLE to get to supine. Acute OT to continue to follow.   ? ?Recommendations for follow up therapy are one component of a multi-disciplinary discharge planning process, led by the attending physician.  Recommendations may be updated based on patient status, additional functional criteria and insurance authorization. ?   ?Follow Up Recommendations ? Home health OT  ?  ?Assistance Recommended at Discharge Intermittent Supervision/Assistance  ?Patient can return home with the following ? A lot of help with walking and/or transfers;A lot of help with bathing/dressing/bathroom;Assistance with cooking/housework;Assist for transportation;Help with stairs or ramp for entrance;Direct supervision/assist for medications management;Direct supervision/assist for financial management ?  ?Equipment Recommendations ? Tub/shower seat  ?  ?Recommendations for Other Services   ? ?  ?Precautions / Restrictions Precautions ?Precautions: Fall;Other (comment) ?Precaution Comments: abdominal precautions for comfort s/p hernia repair ?Restrictions ?Weight Bearing Restrictions: No  ? ? ?  ? ?Mobility Bed Mobility ?Overal bed mobility: Needs Assistance ?Bed Mobility: Sit to  Supine ?  ?  ?  ?Sit to supine: Mod assist ?  ?General bed mobility comments: required assistance with BLEs ?  ? ?Transfers ?Overall transfer level: Needs assistance ?Equipment used: Rolling walker (2 wheels) ?Transfers: Sit to/from Stand ?Sit to Stand: Min assist, From elevated surface ?Stand pivot transfers: Min assist ?  ?  ?  ?  ?General transfer comment: min assist for stand pivot transfer to EOB ?  ?  ?Balance Overall balance assessment: Needs assistance ?Sitting-balance support: No upper extremity supported, Feet supported ?Sitting balance-Leahy Scale: Fair ?  ?  ?Standing balance support: Bilateral upper extremity supported, Reliant on assistive device for balance ?Standing balance-Leahy Scale: Poor ?Standing balance comment: reliant on UE support to stand for transfers ?  ?  ?  ?  ?  ?  ?  ?  ?  ?  ?  ?   ? ?ADL either performed or assessed with clinical judgement  ? ?ADL Overall ADL's : Needs assistance/impaired ?  ?  ?Grooming: Sitting;Applying deodorant;Wash/dry hands;Wash/dry face;Oral care ?Grooming Details (indicate cue type and reason): in recliner ?Upper Body Bathing: Set up;Sitting ?Upper Body Bathing Details (indicate cue type and reason): bathed UB while seated in recliner ?  ?  ?Upper Body Dressing : Minimal assistance;Sitting ?Upper Body Dressing Details (indicate cue type and reason): changed gown ?  ?  ?  ?  ?  ?  ?  ?  ?  ?General ADL Comments: Patient up in recliner and declined standing for self care tasks ?  ? ?Extremity/Trunk Assessment   ?  ?  ?  ?  ?  ? ?Vision   ?  ?  ?Perception   ?  ?Praxis   ?  ? ?Cognition Arousal/Alertness: Awake/alert ?Behavior During Therapy: [facility] for tasks assessed/performed ?Overall Cognitive  Status: No family/caregiver present to determine baseline cognitive functioning ?Area of Impairment: Awareness, Safety/judgement ?  ?  ?  ?  ?  ?  ?  ?  ?  ?  ?  ?  ?Safety/Judgement: Decreased awareness of deficits ?Awareness: Emergent ?  ?  ?  ?  ?   ?Exercises   ? ?   ?Shoulder Instructions   ? ? ?  ?General Comments    ? ? ?Pertinent Vitals/ Pain       Pain Assessment ?Pain Assessment: Faces ?Faces Pain Scale: Hurts even more ?Pain Location: abdomen, LLE, reflux ?Pain Descriptors / Indicators: Discomfort, Guarding, Grimacing ?Pain Intervention(s): Limited activity within patient's tolerance, Monitored during session, Repositioned ? ?Home Living   ?  ?  ?  ?  ?  ?  ?  ?  ?  ?  ?  ?  ?  ?  ?  ?  ?  ?  ? ?  ?Prior Functioning/Environment    ?  ?  ?  ?   ? ?Frequency ? Min 2X/week  ? ? ? ? ?  ?Progress Toward Goals ? ?OT Goals(current goals can now be found in the care plan section) ? Progress towards OT goals: Progressing toward goals ? ?Acute Rehab OT Goals ?Patient Stated Goal: go home ?OT Goal Formulation: With patient ?Time For Goal Achievement: 09/20/21 ?Potential to Achieve Goals: Good ?ADL Goals ?Pt Will Perform Grooming: with set-up;standing;sitting ?Pt Will Perform Upper Body Dressing: with min guard assist;sitting;standing ?Pt Will Perform Lower Body Dressing: with mod assist;sit to/from stand;sitting/lateral leans ?Pt Will Transfer to Toilet: with min assist;squat pivot transfer;stand pivot transfer;bedside commode ?Pt Will Perform Tub/Shower Transfer: 3 in 1;rolling walker;Tub transfer;Shower transfer;ambulating  ?Plan Discharge plan remains appropriate   ? ?Co-evaluation ? ? ?   ?  ?  ?  ?  ? ?  ?AM-PAC OT "6 Clicks" Daily Activity     ?Outcome Measure ? ? Help from another person eating meals?: None ?Help from another person taking care of personal grooming?: A Little ?Help from another person toileting, which includes using toliet, bedpan, or urinal?: A Lot ?Help from another person bathing (including washing, rinsing, drying)?: A Lot ?Help from another person to put on and taking off regular upper body clothing?: A Little ?Help from another person to put on and taking off regular lower body clothing?: A Lot ?6 Click Score: 16 ? ?  ?End of Session Equipment Utilized  During Treatment: Gait belt;Rolling walker (2 wheels);Oxygen ? ?OT Visit Diagnosis: Unsteadiness on feet (R26.81);Other abnormalities of gait and mobility (R26.89);Muscle weakness (generalized) (M62.81) ?  ?Activity Tolerance Patient tolerated treatment well ?  ?Patient Left in bed;with call bell/phone within reach ?  ?Nurse Communication Mobility status ?  ? ?   ? ?Time: [phone] ?OT Time Calculation (min): 31 min ? ?Charges: OT General Charges ?$OT Visit: 1 Visit ?OT Treatments ?$Self Care/Home Management : 23-37 mins ? ?Lodema Hong, OTA ?Acute Rehabilitation Services  ?Pager [phone] ?Office [phone] ? ? ?[place] ?09/07/2021, 1:14 PM ?

## 2021-09-07 NOTE — TOC CM/SW Note (Signed)
?  ?  Durable Medical Equipment  ?(From admission, onward)  ?  ? ? ?  ? ?  Start     Ordered  ? 09/07/21 1027  For home use only DME standard manual wheelchair with seat cushion  Once       ?Comments: Patient suffers from Patient has had a recent decline in their functional status  which impairs their ability to perform daily activities like ambulating  in the home.  A cane  will not resolve issue with performing activities of daily living. A wheelchair will allow patient to safely perform daily activities. Patient can safely propel the wheelchair in the home or has a caregiver who can provide assistance. Length of need lifetime . ?Accessories: elevating leg rests (ELRs), wheel locks, extensions and anti-tippers. ? ?Seat and back cushions  ? 09/07/21 1027  ? 09/07/21 1026  For home use only DME Tub bench  Once       ?Comments: Tub/shower seat  ? 09/07/21 1026  ? ?  ?  ? ?  ?  ?

## 2021-09-07 NOTE — Progress Notes (Signed)
Physical Therapy Treatment ?Patient Details ?Name: Randall Montoya ?MRN: [id number] ?DOB: 1950-11-20 ?Today's Date: 09/07/2021 ? ? ?History of Present Illness Pt is a 71 y/o male admitted 09/05/21 presenting with ventral hernia; pt also endorses fall on 4/15 with LLE pain, workup negative for acute injury. S/p laparoscopic ventral hernia repair with mesh on 4/18. PMH includes arthritis, brain syndrome, cataracts, CHF, COPD, dyspnea, HLD, HTN, pre-diabetes, OSA on CPAP. ?  ?PT Comments  ? ? Pt slowly progressing with mobility. Today's session focused on transfer training with RW; pt able to take pivotal steps with RW and min guard, but declines further distance secondary to abdominal and LLE pain, as well as fatigue; pt self-limiting secondary to this. Increased time discussing needs for safe return home, including w/c since pt not able to ambulate household distances. Will continue to follow acutely to address established goals. ?   ?Recommendations for follow up therapy are one component of a multi-disciplinary discharge planning process, led by the attending physician.  Recommendations may be updated based on patient status, additional functional criteria and insurance authorization. ? ?Follow Up Recommendations ? Home health PT (PTAR transport home) ?  ?  ?Assistance Recommended at Discharge Frequent or constant Supervision/Assistance  ?Patient can return home with the following A little help with walking and/or transfers;A little help with bathing/dressing/bathroom;Assistance with cooking/housework;Assist for transportation;Help with stairs or ramp for entrance ?  ?Equipment Recommendations ? Wheelchair (measurements PT);Wheelchair cushion (measurements PT)  ?  ?Recommendations for Other Services   ? ? ?  ?Precautions / Restrictions Precautions ?Precautions: Fall;Other (comment) ?Precaution Comments: abdominal precautions for comfort s/p hernia repair ?Restrictions ?Weight Bearing Restrictions: No  ?  ? ?Mobility ?  Bed Mobility ?Overal bed mobility: Needs Assistance ?Bed Mobility: Supine to Sit ?  ?Sidelying to sit: Min guard, HOB elevated ?  ?  ?  ?General bed mobility comments: increased time and effort, requires encouragement to perform as independently as possible, heavy use of bed rail and HOB elevated ?  ? ?Transfers ?Overall transfer level: Needs assistance ?Equipment used: Rolling walker (2 wheels) ?Transfers: Sit to/from Stand ?Sit to Stand: Min assist, From elevated surface ?  ?Step pivot transfers: Min guard ?  ?  ?  ?General transfer comment: repeated cues for hand placement and sequencing, pt with difficulty leaning forward to stand secondary to abdominal pain, minA to stabilize RW for standing, increased time and effort; pivotal steps from bed to recliner with RW and min guard, pt declines further distance secondary to pain; poor eccentric control to sit, pt attributes to abdominal pain ?  ? ?Ambulation/Gait ?  ?  ?  ?  ?  ?  ?  ?  ? ? ?Stairs ?  ?  ?  ?  ?  ? ? ?Wheelchair Mobility ?  ? ?Modified Rankin (Stroke Patients Only) ?  ? ? ?  ?Balance Overall balance assessment: Needs assistance ?Sitting-balance support: No upper extremity supported, Feet supported ?Sitting balance-Leahy Scale: Fair ?  ?  ?Standing balance support: During functional activity, Reliant on assistive device for balance, Single extremity supported ?Standing balance-Leahy Scale: Poor ?Standing balance comment: can stand with single UE support on rollator to spit in emesis bag ?  ?  ?  ?  ?  ?  ?  ?  ?  ?  ?  ?  ? ?  ?Cognition Arousal/Alertness: Awake/alert ?Behavior During Therapy: [facility] for tasks assessed/performed ?Overall Cognitive Status: No family/caregiver present to determine baseline cognitive functioning ?Area of Impairment: Awareness,  Safety/judgement ?  ?  ?  ?  ?  ?  ?  ?  ?  ?  ?  ?  ?Safety/Judgement: Decreased awareness of deficits ?Awareness: Emergent ?  ?  ?  ?  ? ?  ?Exercises General Exercises - Lower Extremity ?Long Arc  Quad: AROM, Both, Seated ? ?  ?General Comments General comments (skin integrity, edema, etc.): productive cough during session; SpO2 down to 87% on RA at rest; 91-94% on 3L O2 [place]. pt reports not moving legs in bed, max encouragement on importance of frequent movement/ROM. increased time discussing and encouraging pt to problem solve through needs he'll have upon return to recovery house; pt adamant if ambulance can get him inside, he will be able to get out/down stairs when it's time; pt just wants w/c to be able to mobilize inside; reports friends able to assist as needed ?  ?  ? ?Pertinent Vitals/Pain Pain Assessment ?Pain Assessment: Faces ?Faces Pain Scale: Hurts even more ?Pain Location: abdomen, LLE, reflux ?Pain Descriptors / Indicators: Discomfort, Guarding, Grimacing ?Pain Intervention(s): Limited activity within patient's tolerance, Monitored during session, Repositioned  ? ? ?Home Living   ?  ?  ?  ?  ?  ?  ?  ?  ?  ?   ?  ?Prior Function    ?  ?  ?   ? ?PT Goals (current goals can now be found in the care plan section) Progress towards PT goals: Progressing toward goals (slowly) ? ?  ?Frequency ? ? ? Min 5X/week ? ? ? ?  ?PT Plan Frequency needs to be updated  ? ? ?Co-evaluation   ?  ?  ?  ?  ? ?  ?AM-PAC PT "6 Clicks" Mobility   ?Outcome Measure ? Help needed turning from your back to your side while in a flat bed without using bedrails?: A Little ?Help needed moving from lying on your back to sitting on the side of a flat bed without using bedrails?: A Little ?Help needed moving to and from a bed to a chair (including a wheelchair)?: A Lot ?Help needed standing up from a chair using your arms (e.g., wheelchair or bedside chair)?: A Lot ?Help needed to walk in hospital room?: Total ?Help needed climbing 3-5 steps with a railing? : Total ?6 Click Score: 12 ? ?  ?End of Session Equipment Utilized During Treatment: Gait belt ?Activity Tolerance: Patient limited by pain ?Patient left: in chair;with call  bell/phone within reach ?Nurse Communication: Mobility status ?PT Visit Diagnosis: Other abnormalities of gait and mobility (R26.89);Pain;Difficulty in walking, not elsewhere classified (R26.2) ?  ? ? ?Time: 8333-8329 ?PT Time Calculation (min) (ACUTE ONLY): 31 min ? ?Charges:  $Therapeutic Activity: 8-22 mins ?$Self Care/Home Management: 8-22          ?          ? ?Mabeline Caras, PT, DPT ?Acute Rehabilitation Services  ?Pager [phone] ?Office [phone] ? ?Derry Lory ?09/07/2021, 8:56 AM ? ?

## 2021-09-07 NOTE — Progress Notes (Signed)
Mobility Specialist Progress Note: ? ? 09/07/21 1224  ?Mobility  ?Activity Ambulated with assistance in room;Transferred from bed to chair  ?Level of Assistance Standby assist, set-up cues, supervision of patient - no hands on  ?Assistive Device Four wheel walker  ?Distance Ambulated (ft) 8 ft  ?Activity Response Tolerated well  ?$Mobility charge 1 Mobility  ? ?Pt received in bed willing to participate in mobility. Complaints of abdominal and LLE pain. Left in chair with call bell in reach and all needs met.  ? ?Randall Montoya ?Mobility Specialist ?Primary Phone [phone] ? ?

## 2021-09-08 LAB — CBC
HCT: 44.2 % (ref 39.0–52.0)
Hemoglobin: 14.5 g/dL (ref 13.0–17.0)
MCH: 29.2 pg (ref 26.0–34.0)
MCHC: 32.8 g/dL (ref 30.0–36.0)
MCV: 89.1 fL (ref 80.0–100.0)
Platelets: 251 10*3/uL (ref 150–400)
RBC: 4.96 MIL/uL (ref 4.22–5.81)
RDW: 15.2 % (ref 11.5–15.5)
WBC: 12.4 10*3/uL — ABNORMAL HIGH (ref 4.0–10.5)
nRBC: 0 % (ref 0.0–0.2)

## 2021-09-08 LAB — BASIC METABOLIC PANEL
Anion gap: 8 (ref 5–15)
BUN: 61 mg/dL — ABNORMAL HIGH (ref 8–23)
CO2: 34 mmol/L — ABNORMAL HIGH (ref 22–32)
Calcium: 9.6 mg/dL (ref 8.9–10.3)
Chloride: 97 mmol/L — ABNORMAL LOW (ref 98–111)
Creatinine, Ser: 2.54 mg/dL — ABNORMAL HIGH (ref 0.61–1.24)
GFR, Estimated: 27 mL/min — ABNORMAL LOW (ref >60)
Glucose, Bld: 149 mg/dL — ABNORMAL HIGH (ref 70–99)
Potassium: 4.6 mmol/L (ref 3.5–5.1)
Sodium: 139 mmol/L (ref 135–145)

## 2021-09-08 MED ORDER — SORBITOL 70 % SOLN
960.0000 mL | TOPICAL_OIL | Freq: Once | ORAL | Status: AC
  Administered 2021-09-08: 960 mL via RECTAL
  Filled 2021-09-08: qty 473

## 2021-09-08 MED ORDER — BISACODYL 10 MG RE SUPP
10.0000 mg | Freq: Once | RECTAL | Status: AC
  Administered 2021-09-08: 10 mg via RECTAL
  Filled 2021-09-08: qty 1

## 2021-09-08 MED ORDER — ALUM & MAG HYDROXIDE-SIMETH 200-200-20 MG/5ML PO SUSP
30.0000 mL | ORAL | Status: DC | PRN
  Administered 2021-09-08 – 2021-09-10 (×4): 30 mL via ORAL
  Filled 2021-09-08 (×4): qty 30

## 2021-09-08 MED ORDER — LACTATED RINGERS IV SOLN: INTRAVENOUS | Status: DC

## 2021-09-08 MED ORDER — LACTATED RINGERS IV BOLUS
1000.0000 mL | Freq: Once | INTRAVENOUS | Status: AC
  Administered 2021-09-08: 1000 mL via INTRAVENOUS

## 2021-09-08 MED ORDER — LACTATED RINGERS IV BOLUS
500.0000 mL | Freq: Once | INTRAVENOUS | Status: AC
  Administered 2021-09-08: 500 mL via INTRAVENOUS

## 2021-09-08 MED ORDER — POLYETHYLENE GLYCOL 3350 17 G PO PACK
17.0000 g | PACK | Freq: Three times a day (TID) | ORAL | Status: DC
  Administered 2021-09-08 (×2): 17 g via ORAL
  Filled 2021-09-08 (×2): qty 1

## 2021-09-08 MED ORDER — MAGNESIUM HYDROXIDE 400 MG/5ML PO SUSP
30.0000 mL | Freq: Two times a day (BID) | ORAL | Status: DC
  Administered 2021-09-08: 30 mL via ORAL
  Filled 2021-09-08: qty 30

## 2021-09-08 NOTE — Progress Notes (Signed)
Physical Therapy Treatment ?Patient Details ?Name: Randall Montoya ?MRN: [id number] ?DOB: [birth year] ?Today's Date: 09/08/2021 ? ? ?History of Present Illness Pt is a 71 y/o male admitted 09/05/21 presenting with ventral hernia; pt also endorses fall on 4/15 with LLE pain, workup negative for acute injury. S/p laparoscopic ventral hernia repair with mesh on 4/18. PMH includes arthritis, brain syndrome, cataracts, CHF, COPD, dyspnea, HLD, HTN, pre-diabetes, OSA on CPAP. ? ?  ?PT Comments  ? ? Patient received in bed, declined PT on first arrival, this is second attempt. Patient continues to report he is feeing very bad. Requires encouragement to participate. Reporting heartburn as well. Patient requires mod assist for supine to sit. Min assist for sit to stand from slightly elevated bed. Ambulated 4 feet from bed to recliner with min guard and RW. Patient pain limited and declines going further. Once in recliner he immediately reclines to almost lying flat. Patient educated that sitting in this position defeats the purpose of sitting in recliner. Patient continued to keep back of chair reclined. He will continue to benefit from skilled PT while here to improve functional independence, strength and activity tolerance.  ?       ?Recommendations for follow up therapy are one component of a multi-disciplinary discharge planning process, led by the attending physician.  Recommendations may be updated based on patient status, additional functional criteria and insurance authorization. ? ?Follow Up Recommendations ? Home health PT ?  ?  ?Assistance Recommended at Discharge Frequent or constant Supervision/Assistance  ?Patient can return home with the following A little help with walking and/or transfers;A little help with bathing/dressing/bathroom;Assistance with cooking/housework;Assist for transportation;Help with stairs or ramp for entrance ?  ?Equipment Recommendations ? Wheelchair (measurements PT);Wheelchair cushion  (measurements PT)  ?  ?Recommendations for Other Services   ? ? ?  ?Precautions / Restrictions Precautions ?Precautions: Fall ?Precaution Comments: abdominal precautions for comfort s/p hernia repair ?Restrictions ?Weight Bearing Restrictions: No  ?  ? ?Mobility ? Bed Mobility ?Overal bed mobility: Needs Assistance ?Bed Mobility: Supine to Sit ?  ?Sidelying to sit: Mod assist, HOB elevated ?  ?  ?  ?General bed mobility comments: requires assist to raise trunk to sitting position due to pain ?  ? ?Transfers ?Overall transfer level: Needs assistance ?Equipment used: Rolling walker (2 wheels) ?Transfers: Sit to/from Stand ?Sit to Stand: Min assist, From elevated surface ?  ?Step pivot transfers: Min guard ?  ?  ?  ?General transfer comment: patient able to walk 4 feet from bed to recliner with RW and min guard. ?  ? ?Ambulation/Gait ?Ambulation/Gait assistance: Min guard ?Gait Distance (Feet): 4 Feet ?Assistive device: Rolling walker (2 wheels) ?Gait Pattern/deviations: Step-to pattern, Decreased step length - right, Decreased step length - left ?Gait velocity: decr ?  ?  ?General Gait Details: patient with good balance using RW to ambulate to recliner. Reports pain limited. Does not want to walk further. O2 saturations dropped to 71% on room air with mobility. ? ? ?Stairs ?  ?  ?  ?  ?  ? ? ?Wheelchair Mobility ?  ? ?Modified Rankin (Stroke Patients Only) ?  ? ? ?  ?Balance Overall balance assessment: Needs assistance, History of Falls ?Sitting-balance support: Feet supported ?Sitting balance-Leahy Scale: Good ?Sitting balance - Comments: No physical assist needed to maintain sitting balance at edge of bed ?  ?Standing balance support: Bilateral upper extremity supported, During functional activity, Reliant on assistive device for balance ?Standing balance-Leahy Scale: Fair ?  ?  ?  ?  ?  ?  ?  ?  ?  ?  ?  ?  ?  ? ?  ?  Cognition Arousal/Alertness: Awake/alert ?Behavior During Therapy: [facility] for tasks  assessed/performed ?Overall Cognitive Status: No family/caregiver present to determine baseline cognitive functioning ?Area of Impairment: Awareness ?  ?  ?  ?  ?  ?  ?  ?  ?  ?  ?  ?  ?Safety/Judgement: Decreased awareness of deficits ?Awareness: Emergent ?  ?General Comments: pt with decreased safety awareness/awareness of deficits ?  ?  ? ?  ?Exercises   ? ?  ?General Comments   ?  ?  ? ?Pertinent Vitals/Pain Pain Assessment ?Pain Assessment: 0-10 ?Pain Score: 7  ?Pain Location: abdomen, reflux ?Pain Descriptors / Indicators: Discomfort, Guarding, Grimacing ?Pain Intervention(s): Monitored during session, Repositioned  ? ? ?Home Living   ?  ?  ?  ?  ?  ?  ?  ?  ?  ?   ?  ?Prior Function    ?  ?  ?   ? ?PT Goals (current goals can now be found in the care plan section) Acute Rehab PT Goals ?Patient Stated Goal: to go home ?PT Goal Formulation: With patient ?Time For Goal Achievement: 09/20/21 ?Potential to Achieve Goals: Fair ?Progress towards PT goals: Progressing toward goals ? ?  ?Frequency ? ? ? Min 5X/week ? ? ? ?  ?PT Plan Current plan remains appropriate  ? ? ?Co-evaluation   ?  ?  ?  ?  ? ?  ?AM-PAC PT "6 Clicks" Mobility   ?Outcome Measure ? Help needed turning from your back to your side while in a flat bed without using bedrails?: A Little ?Help needed moving from lying on your back to sitting on the side of a flat bed without using bedrails?: A Little ?Help needed moving to and from a bed to a chair (including a wheelchair)?: A Little ?Help needed standing up from a chair using your arms (e.g., wheelchair or bedside chair)?: A Little ?Help needed to walk in hospital room?: A Lot ?Help needed climbing 3-5 steps with a railing? : A Lot ?6 Click Score: 16 ? ?  ?End of Session Equipment Utilized During Treatment: Oxygen ?Activity Tolerance: Patient limited by pain ?Patient left: in chair;with call bell/phone within reach ?Nurse Communication: Mobility status ?PT Visit Diagnosis: Other abnormalities of gait  and mobility (R26.89);Pain;Difficulty in walking, not elsewhere classified (R26.2);Muscle weakness (generalized) (M62.81) ?Pain - part of body:  (abdomen) ?  ? ? ?Time: 1610-9604 ?PT Time Calculation (min) (ACUTE ONLY): 21 min ? ?Charges:  $Gait Training: 8-22 mins          ?          ? ?Amanda Cockayne, PT, GCS ?09/08/21,2:43 PM ? ? ?

## 2021-09-08 NOTE — Progress Notes (Signed)
PT Cancellation Note ? ?Patient Details ?Name: Randall Montoya ?MRN: [id number] ?DOB: June 10, 1950 ? ? ?Cancelled Treatment:    Reason Eval/Treat Not Completed: Patient declined, no reason specified;Other (comment) (not feeling well). Patient asks if I can come back later. Will re-attempt a little later as time allows.   ? ? ?Mahiya Kercheval ?09/08/2021, 1:08 PM ?

## 2021-09-08 NOTE — Progress Notes (Signed)
? ?  General Surgery Follow Up Note ? ?Subjective:  ?  ?Overnight Issues:  ? ?Objective:  ?Vital signs for last 24 hours: ?Temp:  [98 ?F (36.7 ?C)-98.4 ?F (36.9 ?C)] 98.1 ?F (36.7 ?C) (04/21 4166) ?Pulse Rate:  [78-89] 86 (04/21 0839) ?Resp:  [18] 18 (04/21 0839) ?BP: (111-133)/(58-66) 116/64 (04/21 0630) ?SpO2:  [91 %-94 %] 93 % (04/21 0839) ? ?Hemodynamic parameters for last 24 hours: ?  ? ?Intake/Output from previous day: ?04/20 0701 - 04/21 0700 ?In: 240 [P.O.:240] ?Out: 300 [Urine:300]  ?Intake/Output this shift: ?No intake/output data recorded. ? ?Vent settings for last 24 hours: ?  ? ?Physical Exam:  ?Gen: comfortable, no distress ?Neuro: non-focal exam ?HEENT: PERRL ?Neck: supple ?CV: RRR ?Pulm: unlabored breathing ?Abd: soft, distended, appropriately TTP, incisions c/d/i ?GU: clear yellow urine ?Extr: wwp, no edema ? ? ?No results found for this or any previous visit (from the past 24 hour(s)). ? ? ?Assessment & Plan: ? ?Present on Admission: ?**None** ? ? ? LOS: 1 day  ? ?Additional comments:I reviewed the patient's new clinical lab test results.   and I reviewed the patients new imaging test results.   ? ?VH - s/p lap VHR with mesh 4/18, still not hungry and has not passed a BM. Escalate bowel regimen ?LLE pain - s/p fall 4/16, XR done pre-op and negative, PT/OT eval recs for HHPT, add flutter valve, another session of therapy today ?FEN - reg diet, escalate bowel regimen again with SMOG ?DVT - SCDs, LMWH ?Dispo - home after BM ? ? ?Jesusita Oka, MD ?Trauma & General Surgery ?Please use [email or web address] to contact on call provider ? ?09/08/2021 ? ?*Care during the described time interval was provided by me. I have reviewed this patient's available data, including medical history, events of note, physical examination and test results as part of my evaluation. ? ?

## 2021-09-08 NOTE — Progress Notes (Signed)
Mobility Specialist Progress Note: ? ? 09/08/21 1522  ?Mobility  ?Activity Refused mobility  ? ?Pt stating he just got back to bed and wanting to rest. Will follow-up as time allows.  ? ?Onesti Bonfiglio ?Mobility Specialist ?Primary Phone [phone] ? ?

## 2021-09-08 NOTE — Progress Notes (Addendum)
Flutter valve given to pt. Education given with proper pt demonstration displayed. Pt able to use 3 times and tolerated use fairly well. ?

## 2021-09-08 NOTE — Progress Notes (Signed)
? ?  General Surgery Follow Up Note ? ?Subjective:  ?  ?Overnight Issues:  ? ?Objective:  ?Vital signs for last 24 hours: ?Temp:  [98 ?F (36.7 ?C)-98.4 ?F (36.9 ?C)] 98.1 ?F (36.7 ?C) (04/21 6269) ?Pulse Rate:  [78-89] 86 (04/21 0839) ?Resp:  [18] 18 (04/21 0839) ?BP: (111-133)/(58-66) 116/64 (04/21 4854) ?SpO2:  [91 %-94 %] 93 % (04/21 0839) ? ?Hemodynamic parameters for last 24 hours: ?  ? ?Intake/Output from previous day: ?04/20 0701 - 04/21 0700 ?In: 240 [P.O.:240] ?Out: 300 [Urine:300]  ?Intake/Output this shift: ?No intake/output data recorded. ? ?Vent settings for last 24 hours: ?  ? ?Physical Exam:  ?Gen: comfortable, no distress ?Neuro: non-focal exam ?HEENT: PERRL ?Neck: supple ?CV: RRR ?Pulm: unlabored breathing ?Abd: soft, distended, appropriately TTP, incisions c/d/i ?GU: clear yellow urine ?Extr: wwp, no edema ? ? ?No results found for this or any previous visit (from the past 24 hour(s)). ? ? ?Assessment & Plan: ? ?Present on Admission: ?**None** ? ? ? LOS: 1 day  ? ?Additional comments:I reviewed the patient's new clinical lab test results.   and I reviewed the patients new imaging test results.   ? ?VH - s/p lap VHR with mesh 4/18, still not hungry and has not passed a BM. Escalated bowel regimen ?LLE pain - s/p fall 4/16, XR done pre-op and negative, PT/OT eval recs for HHPT, desats, so will give another day and add flutter valve ?FEN - reg diet, escalate bowel regimen ?DVT - SCDs, LMWH ?Dispo - home when cleared by PT  ? ? ?Jesusita Oka, MD ?Trauma & General Surgery ?Please use [email or web address] to contact on call provider ? ?09/08/2021 ? ?*Care during the described time interval was provided by me. I have reviewed this patient's available data, including medical history, events of note, physical examination and test results as part of my evaluation. ? ?

## 2021-09-08 NOTE — Progress Notes (Signed)
Pt able to tolerate about 255m of SMOG enema. No BM yet. Pt states he is willing to try additional ml of SMOG enema at a later time if there is no BM. ?

## 2021-09-08 NOTE — Progress Notes (Signed)
Pt able to tolerate additional 250 ml of SMOG enema at this time. ?

## 2021-09-09 LAB — CBC
HCT: 39.2 % (ref 39.0–52.0)
Hemoglobin: 12.9 g/dL — ABNORMAL LOW (ref 13.0–17.0)
MCH: 29.7 pg (ref 26.0–34.0)
MCHC: 32.9 g/dL (ref 30.0–36.0)
MCV: 90.1 fL (ref 80.0–100.0)
Platelets: 241 10*3/uL (ref 150–400)
RBC: 4.35 MIL/uL (ref 4.22–5.81)
RDW: 15.1 % (ref 11.5–15.5)
WBC: 9.2 10*3/uL (ref 4.0–10.5)
nRBC: 0 % (ref 0.0–0.2)

## 2021-09-09 LAB — BASIC METABOLIC PANEL
Anion gap: 9 (ref 5–15)
BUN: 74 mg/dL — ABNORMAL HIGH (ref 8–23)
CO2: 29 mmol/L (ref 22–32)
Calcium: 8.8 mg/dL — ABNORMAL LOW (ref 8.9–10.3)
Chloride: 98 mmol/L (ref 98–111)
Creatinine, Ser: 2.48 mg/dL — ABNORMAL HIGH (ref 0.61–1.24)
GFR, Estimated: 27 mL/min — ABNORMAL LOW (ref >60)
Glucose, Bld: 139 mg/dL — ABNORMAL HIGH (ref 70–99)
Potassium: 4.5 mmol/L (ref 3.5–5.1)
Sodium: 136 mmol/L (ref 135–145)

## 2021-09-09 MED ORDER — POLYETHYLENE GLYCOL 3350 17 G PO PACK: 17.0000 g | PACK | Freq: Every day | ORAL | Status: DC | PRN

## 2021-09-09 MED ORDER — ENSURE SURGERY PO LIQD
237.0000 mL | Freq: Two times a day (BID) | ORAL | Status: DC
  Administered 2021-09-09 (×2): 237 mL via ORAL
  Filled 2021-09-09 (×8): qty 237

## 2021-09-09 NOTE — Progress Notes (Signed)
? ?General Surgery Follow Up Note ? ?Subjective:  ?  ?Overnight Issues:  ? ?Objective:  ?Vital signs for last 24 hours: ?Temp:  [97.6 ?F (36.4 ?C)-98.6 ?F (37 ?C)] 97.6 ?F (36.4 ?C) (04/21 2155) ?Pulse Rate:  [72-97] 72 (04/21 2155) ?Resp:  [18] 18 (04/21 2155) ?BP: (115-119)/(55-72) 115/55 (04/21 2155) ?SpO2:  [88 %-95 %] 88 % (04/21 2155) ? ?Hemodynamic parameters for last 24 hours: ?  ? ?Intake/Output from previous day: ?04/21 0701 - 04/22 0700 ?In: 310 [P.O.:310] ?Out: 1026 [Urine:1025; Stool:1]  ?Intake/Output this shift: ?Total I/O ?In: 240 [P.O.:240] ?Out: 325 [Urine:325] ? ?Vent settings for last 24 hours: ?  ? ?Physical Exam:  ?Gen: comfortable, no distress ?Neuro: non-focal exam ?HEENT: PERRL ?Neck: supple ?CV: RRR ?Pulm: unlabored breathing ?Abd: soft, NT ?GU: clear yellow urine ?Extr: wwp, no edema ? ? ?Results for orders placed or performed during the hospital encounter of 09/05/21 (from the past 24 hour(s))  ?CBC     Status: Abnormal  ? Collection Time: 09/08/21 11:18 AM  ?Result Value Ref Range  ? WBC 12.4 (H) 4.0 - 10.5 K/uL  ? RBC 4.96 4.22 - 5.81 MIL/uL  ? Hemoglobin 14.5 13.0 - 17.0 g/dL  ? HCT 44.2 39.0 - 52.0 %  ? MCV 89.1 80.0 - 100.0 fL  ? MCH 29.2 26.0 - 34.0 pg  ? MCHC 32.8 30.0 - 36.0 g/dL  ? RDW 15.2 11.5 - 15.5 %  ? Platelets 251 150 - 400 K/uL  ? nRBC 0.0 0.0 - 0.2 %  ?Basic metabolic panel     Status: Abnormal  ? Collection Time: 09/08/21 11:18 AM  ?Result Value Ref Range  ? Sodium 139 135 - 145 mmol/L  ? Potassium 4.6 3.5 - 5.1 mmol/L  ? Chloride 97 (L) 98 - 111 mmol/L  ? CO2 34 (H) 22 - 32 mmol/L  ? Glucose, Bld 149 (H) 70 - 99 mg/dL  ? BUN 61 (H) 8 - 23 mg/dL  ? Creatinine, Ser 2.54 (H) 0.61 - 1.24 mg/dL  ? Calcium 9.6 8.9 - 10.3 mg/dL  ? GFR, Estimated 27 (L) >60 mL/min  ? Anion gap 8 5 - 15  ?Basic metabolic panel     Status: Abnormal  ? Collection Time: 09/09/21  1:03 AM  ?Result Value Ref Range  ? Sodium 136 135 - 145 mmol/L  ? Potassium 4.5 3.5 - 5.1 mmol/L  ? Chloride 98 98 -  111 mmol/L  ? CO2 29 22 - 32 mmol/L  ? Glucose, Bld 139 (H) 70 - 99 mg/dL  ? BUN 74 (H) 8 - 23 mg/dL  ? Creatinine, Ser 2.48 (H) 0.61 - 1.24 mg/dL  ? Calcium 8.8 (L) 8.9 - 10.3 mg/dL  ? GFR, Estimated 27 (L) >60 mL/min  ? Anion gap 9 5 - 15  ?CBC     Status: Abnormal  ? Collection Time: 09/09/21  1:03 AM  ?Result Value Ref Range  ? WBC 9.2 4.0 - 10.5 K/uL  ? RBC 4.35 4.22 - 5.81 MIL/uL  ? Hemoglobin 12.9 (L) 13.0 - 17.0 g/dL  ? HCT 39.2 39.0 - 52.0 %  ? MCV 90.1 80.0 - 100.0 fL  ? MCH 29.7 26.0 - 34.0 pg  ? MCHC 32.9 30.0 - 36.0 g/dL  ? RDW 15.1 11.5 - 15.5 %  ? Platelets 241 150 - 400 K/uL  ? nRBC 0.0 0.0 - 0.2 %  ? ? ?Assessment & Plan: ? ?Present on Admission: ?**None** ? ? ? LOS: 2  days  ? ?Additional comments:I reviewed the patient's new clinical lab test results.   and I reviewed the patients new imaging test results.   ? ?VH - s/p lap VHR with mesh 4/18, slightly more hungry, passed BM x2. ?LLE pain - s/p fall 4/16, XR done pre-op and negative, PT/OT eval recs for HHPT, flutter valve, another session of therapy today ?FEN - reg diet ?DVT - SCDs, LMWH ?Dispo - home later today after diet ? ?Jesusita Oka, MD ?Trauma & General Surgery ?Please use [email or web address] to contact on call provider ? ?09/09/2021 ? ?*Care during the described time interval was provided by me. I have reviewed this patient's available data, including medical history, events of note, physical examination and test results as part of my evaluation. ? ?

## 2021-09-09 NOTE — Progress Notes (Signed)
Mobility Specialist Progress Note: ? ?SATURATION QUALIFICATIONS: (This note is used to comply with regulatory documentation for home oxygen) ? ?Patient Saturations on Room Air at Rest = 91% ? ?Patient Saturations on Room Air while Ambulating = 85% ? ?Patient Saturations on 2 Liters of oxygen while Ambulating = 92% ? ?Randall Montoya ?Mobility Specialist ?Primary Phone [phone] ? ?

## 2021-09-09 NOTE — Progress Notes (Signed)
Updated Dr. Kae Heller regarding pt's continued poor intake. 0% of both breakfast and lunch eaten. Pt has had one ensure this morning and has been encouraged to start on another at this time. Will continue to give Zofran per [facility] for nausea prophylaxis and encourage PO intake. ? ?Per MD, discharge cancelled for today. Updated CM of pt need for home O2. Order for DME O2 placed at this time.  ?

## 2021-09-09 NOTE — Progress Notes (Signed)
Mobility Specialist Progress Note: ? ? 09/09/21 1125  ?Mobility  ?Activity Ambulated with assistance in room;Ambulated with assistance to bathroom  ?Level of Assistance Standby assist, set-up cues, supervision of patient - no hands on  ?Assistive Device Four wheel walker  ?Distance Ambulated (ft) 50 ft  ?Activity Response Tolerated well  ?$Mobility charge 1 Mobility  ? ?Pt received in bed willing to participate in mobility. No complaints of pain. Left in bed with call bell in reach and all needs met.  ? ?Randall Montoya ?Mobility Specialist ?Primary Phone [phone] ? ?

## 2021-09-09 NOTE — TOC Progression Note (Signed)
Transition of Care (TOC) - Progression Note  ? ? ?Patient Details  ?Name: Randall Montoya ?MRN: [id number] ?Date of Birth: 1950-09-27 ? ?Transition of Care (TOC) CM/SW Contact  ?Bartholomew Crews, RN ?Phone Number: [phone] ?09/09/2021, 4:20 PM ? ?Clinical Narrative:    ? ?Spoke with patient at the bedside to discuss post acute transition. Patient had wheelchair in the room that had been delivered this admission. Patient stated that tub bench had been taken home by one of his roommates. Confirmed home address and phone. Patient stated that he rents a room in a boarding house. Patient stated that he has a key on his room door. Patient has a rollator that is from [facility] and is at the hospital with him.  ? ?Patient has Medicaid transportation from [place], and stated that [facility] brought him to the hospital for a planned procedure. Discussed [facility] picking him up, however, services are not available on the weekend. Patient spoke to one of his roommates who is available after 10 am on Monday to assist with patient getting into the home.  ? ?Patient does not have anyone who can pick up his wheelchair and rollator stating that the roommate who picked up tub bench is not available for anything else due to his own pending pending appointments. [facility] will not transport DME.  ? ?Discussed home oxygen needs, and patient advised that he also needs 3/1. Referral to [employer] for delivery to the room tomorrow.  ? ?Noted previous TOC had arranged [facility] for PT/OT. Confirmed following with weekend liaison.  ? ?TOC following for transition needs.  ? ?Expected Discharge Plan: [place] ?Barriers to Discharge: Continued Medical Work up ? ?Expected Discharge Plan and Services ?Expected Discharge Plan: [place] ?  ?Discharge Planning Services: CM Consult ?Post Acute Care Choice: Durable Medical Equipment, Home Health ?Living arrangements for the past 2 months: [place] ?Expected Discharge  Date: 09/09/21               ?DME Arranged: 3-N-1, Oxygen ?DME Agency: [facility] ?Date DME Agency Contacted: 09/09/21 ?Time DME Agency Contacted: [phone] ?Representative spoke with at DME Agency: Delana Meyer ?HH Arranged: PT, OT ?[place] Agency: [place] ?Date HH Agency Contacted: 09/09/21 ?Time [place]: [phone] ?Representative spoke with at [place]: Alwyn Ren ? ? ?Social Determinants of Health (SDOH) Interventions ?  ? ?Readmission Risk Interventions ?   ? View : No data to display.  ?  ?  ?  ? ? ?

## 2021-09-09 NOTE — Progress Notes (Signed)
Pt maintains poor appetite this morning. Declining breakfast at this time. He denies any nausea or vomiting. Several BMs overnight and audible bowel sounds in all quadrants. Claiborne Billings with Gen Surgery notified. See new orders for nutritional supplement. Will continue to encourage PO intake. ? ?Pt weaned to 1L  and maintains a SpO2 of 92-94% with no dyspnea reported or noted. ?

## 2021-09-09 NOTE — Progress Notes (Signed)
Per pt's request, family member at bedside is taking bath tub bench home. Pt WC remains at bedside. ?

## 2021-09-10 LAB — CBC
HCT: 38.1 % — ABNORMAL LOW (ref 39.0–52.0)
Hemoglobin: 12.3 g/dL — ABNORMAL LOW (ref 13.0–17.0)
MCH: 29.1 pg (ref 26.0–34.0)
MCHC: 32.3 g/dL (ref 30.0–36.0)
MCV: 90.1 fL (ref 80.0–100.0)
Platelets: 215 10*3/uL (ref 150–400)
RBC: 4.23 MIL/uL (ref 4.22–5.81)
RDW: 15.1 % (ref 11.5–15.5)
WBC: 7.4 10*3/uL (ref 4.0–10.5)
nRBC: 0 % (ref 0.0–0.2)

## 2021-09-10 LAB — BASIC METABOLIC PANEL
Anion gap: 6 (ref 5–15)
BUN: 67 mg/dL — ABNORMAL HIGH (ref 8–23)
CO2: 29 mmol/L (ref 22–32)
Calcium: 8.3 mg/dL — ABNORMAL LOW (ref 8.9–10.3)
Chloride: 103 mmol/L (ref 98–111)
Creatinine, Ser: 1.45 mg/dL — ABNORMAL HIGH (ref 0.61–1.24)
GFR, Estimated: 52 mL/min — ABNORMAL LOW (ref >60)
Glucose, Bld: 114 mg/dL — ABNORMAL HIGH (ref 70–99)
Potassium: 5 mmol/L (ref 3.5–5.1)
Sodium: 138 mmol/L (ref 135–145)

## 2021-09-10 MED ORDER — PHENOL 1.4 % MT LIQD
1.0000 | OROMUCOSAL | Status: DC | PRN
  Administered 2021-09-10: 1 via OROMUCOSAL
  Filled 2021-09-10 (×2): qty 177

## 2021-09-10 NOTE — Progress Notes (Signed)
? ?  General Surgery Follow Up Note ? ?Subjective:  ?  ?Overnight Issues:  ? ?Objective:  ?Vital signs for last 24 hours: ?Temp:  [98 ?F (36.7 ?C)-98.4 ?F (36.9 ?C)] 98.1 ?F (36.7 ?C) (04/22 2122) ?Pulse Rate:  [69-99] 99 (04/22 2122) ?Resp:  [18] 18 (04/22 2122) ?BP: (118-173)/(54-65) 173/62 (04/22 2122) ?SpO2:  [90 %-96 %] 96 % (04/22 2122) ? ?Hemodynamic parameters for last 24 hours: ?  ? ?Intake/Output from previous day: ?04/22 0701 - 04/23 0700 ?In: 55 [P.O.:530] ?Out: 650 [Urine:650]  ?Intake/Output this shift: ?Total I/O ?In: 240 [P.O.:240] ?Out: 200 [Urine:200] ? ?Vent settings for last 24 hours: ?  ? ?Physical Exam:  ?Gen: comfortable, no distress ?Neuro: non-focal exam ?HEENT: PERRL ?Neck: supple ?CV: RRR ?Pulm: unlabored breathing ?Abd: soft, appropriately TTP. Incisions c/d/i ?GU: clear yellow urine ?Extr: wwp, no edema ? ? ?No results found for this or any previous visit (from the past 24 hour(s)). ? ?Assessment & Plan: ? ?Present on Admission: ?**None** ? ? ? LOS: 3 days  ? ?Additional comments:None ? ?VH - s/p lap VHR with mesh 4/18, multiple BMs, still having nausea/belching causing PO avoidance ?LLE pain - s/p fall 4/16, XR done pre-op and negative, PT/OT eval recs for HHPT, flutter valve ?FEN - reg diet ?DVT - SCDs, LMWH ?Dispo - consider CT A/P based on labs ? ?Jesusita Oka, MD ?Trauma & General Surgery ?Please use [email or web address] to contact on call provider ? ?09/10/2021 ? ?*Care during the described time interval was provided by me. I have reviewed this patient's available data, including medical history, events of note, physical examination and test results as part of my evaluation. ? ?

## 2021-09-11 ENCOUNTER — Other Ambulatory Visit (HOSPITAL_COMMUNITY): Payer: Self-pay

## 2021-09-11 ENCOUNTER — Inpatient Hospital Stay (HOSPITAL_COMMUNITY): Payer: Medicare Other

## 2021-09-11 DIAGNOSIS — R12 Heartburn: Secondary | ICD-10-CM | POA: Diagnosis not present

## 2021-09-11 DIAGNOSIS — R1310 Dysphagia, unspecified: Secondary | ICD-10-CM | POA: Diagnosis not present

## 2021-09-11 DIAGNOSIS — Z9889 Other specified postprocedural states: Secondary | ICD-10-CM

## 2021-09-11 DIAGNOSIS — R066 Hiccough: Secondary | ICD-10-CM | POA: Diagnosis not present

## 2021-09-11 DIAGNOSIS — Z8719 Personal history of other diseases of the digestive system: Secondary | ICD-10-CM

## 2021-09-11 IMAGING — RF DG ESOPHAGUS
8 series · 17 of 24 positions shown · non-contrast
Comparison: NONE.

CLINICAL DATA: Chest pain with swallowing, onset after recent
umbilical hernia repair

EXAM:
ESOPHAGUS/BARIUM SWALLOW/TABLET STUDY
TECHNIQUE: Limited single contrast examination was performed using thin liquid
barium. This exam was performed by GRISSOM, and was
supervised and interpreted by GRISSOM.
FLUOROSCOPY:
Radiation Exposure Index (as provided by the fluoroscopic device):
[4X]

[Series 1: cp_standard · 0.55mm/px · 3 of 96 frames shown (1 of 8)]
[frame 15/96]
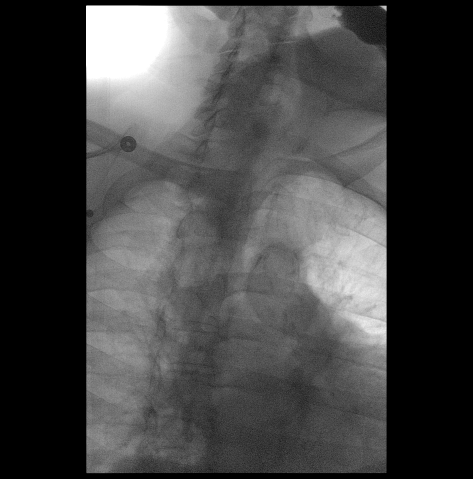
[frame 82/96]
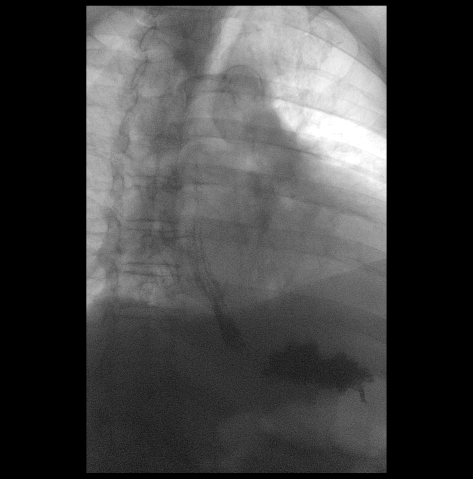
[frame 95/96]
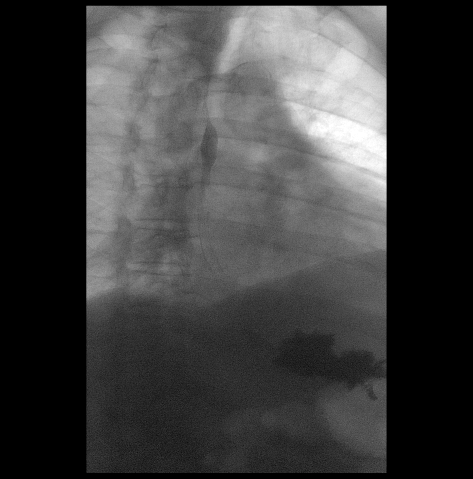

[Series 2: cp_standard · 0.56mm/px · 3 of 79 frames shown (2 of 8)]
[frame 12/79]
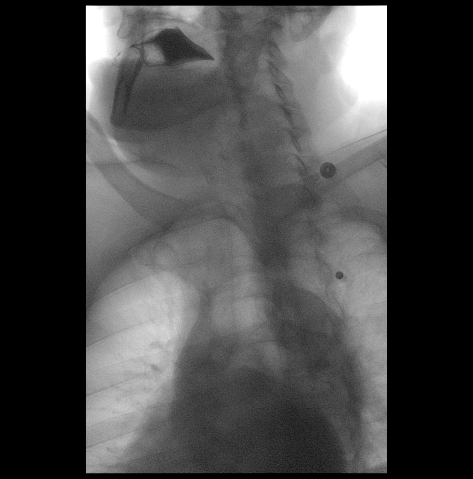
[frame 40/79]
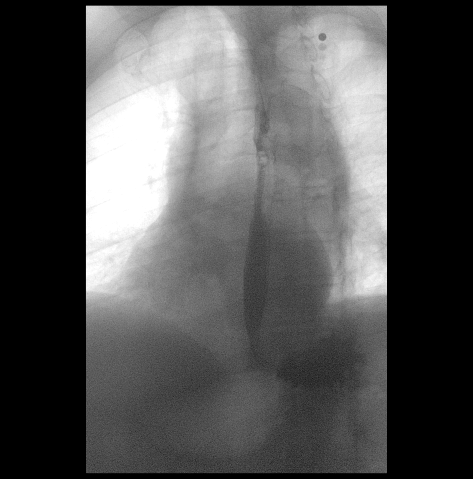
[frame 68/79]
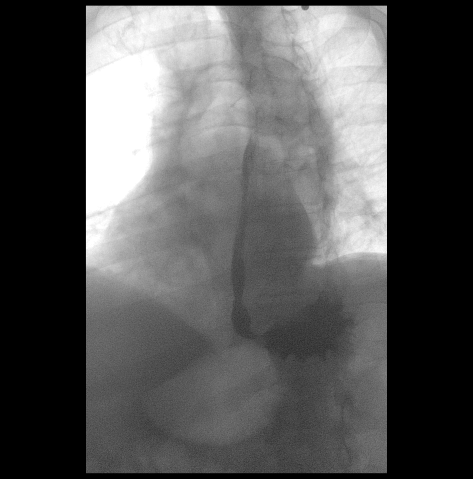

[Series 3: cp_standard · 0.57mm/px · 2 of 170 frames shown (3 of 8)]
[frame 26/170]
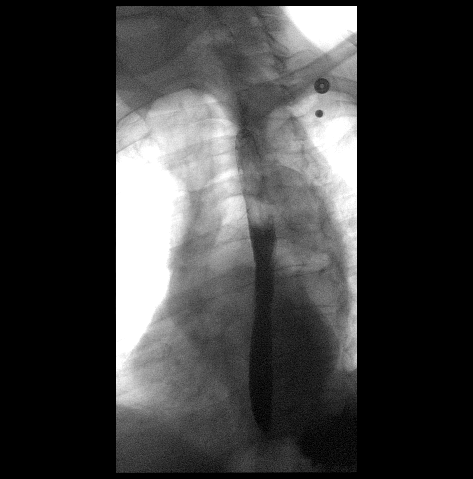
[frame 86/170]
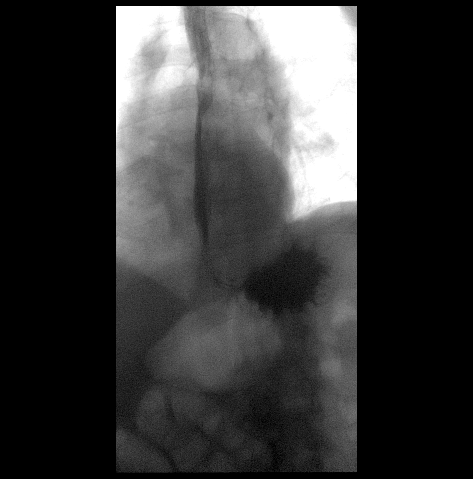

[Series 4: cp_standard · 0.57mm/px · 3 of 70 frames shown (4 of 8)]
[frame 34/70]
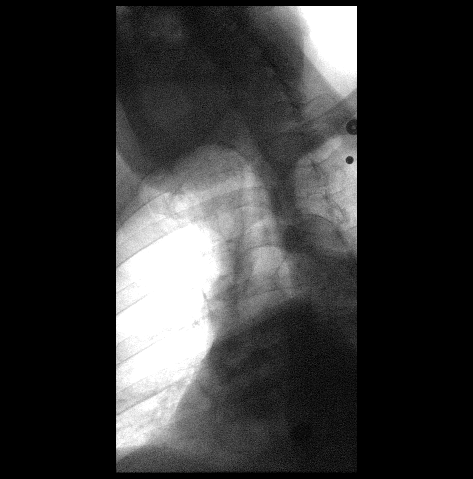
[frame 36/70]
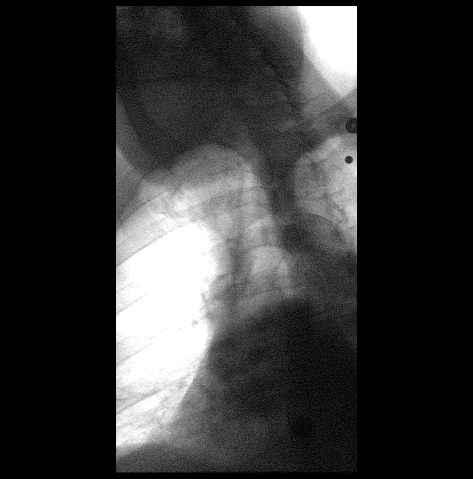
[frame 60/70]
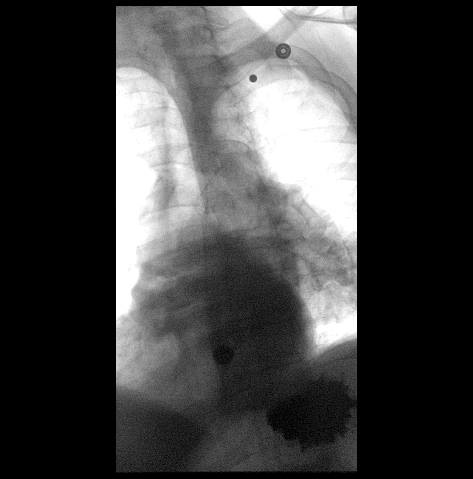

[Series 5: cp_standard · 0.57mm/px · 2 of 54 frames shown (5 of 8)]
[frame 17/54]
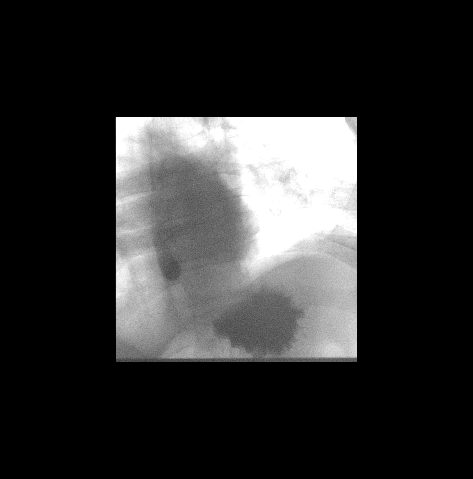
[frame 28/54]
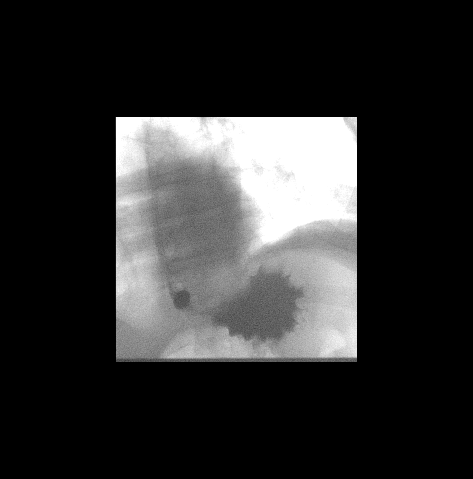

[Series 6: cp_standard · 0.25mm/px · 1 of 1 slices shown (6 of 8)]
[im 1/1]
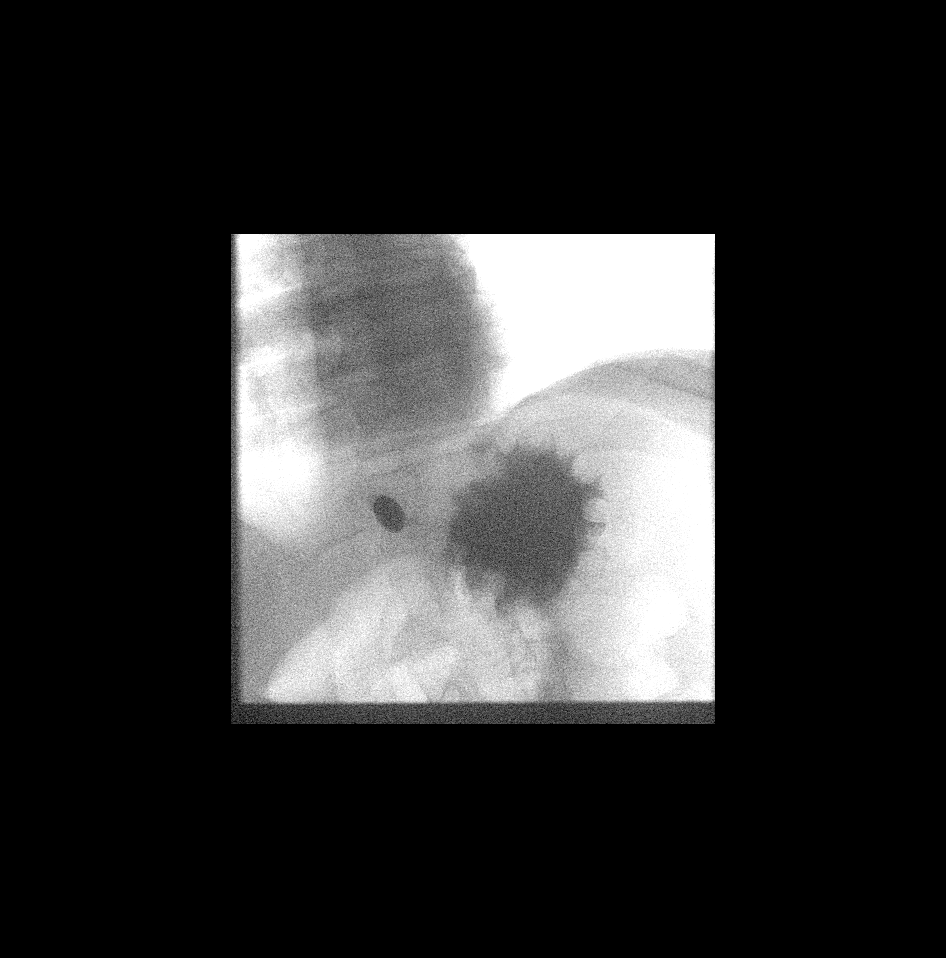

[Series 7: cp_standard · 0.58mm/px · 2 of 5 frames shown (7 of 8)]
[frame 1/5]
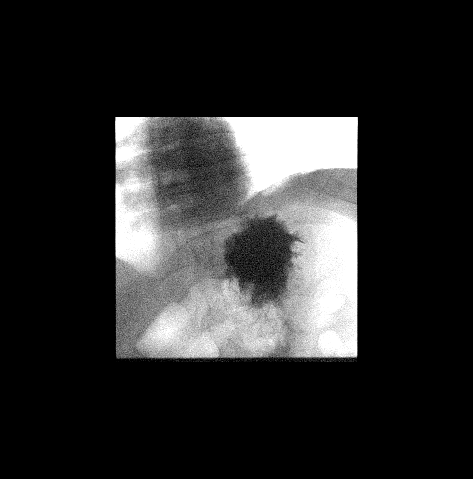
[frame 3/5]
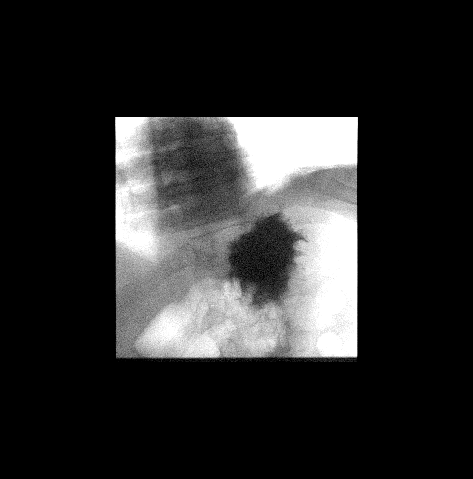

[Series 8: cp_standard · 0.27mm/px · 1 of 1 slices shown (8 of 8)]
[im 1/1]
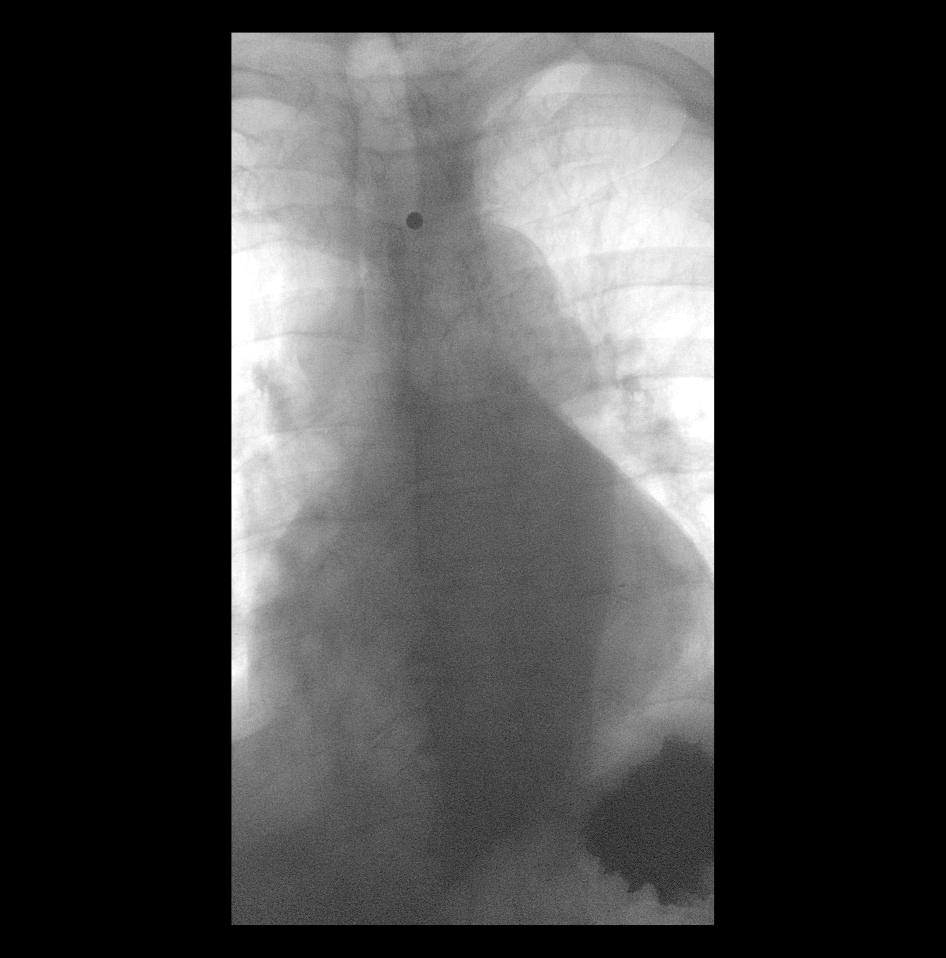

[17 of 24 positions shown; findings below may reference images not displayed]

FINDINGS: Swallowing: Appears normal. No vestibular penetration or aspiration
seen.

Pharynx: Unremarkable.

Esophagus: Normal appearance.

Esophageal motility: Mild stasis in the distal esophagus, cleared
with prompted additional swallow, pati[REDACTED] pain with
visualized stasis.

Hiatal Hernia: None apparent.

Gastroesophageal reflux: None visualized.

Ingested 13mm barium tablet: Delay in passage near the GE junction,
cleared with prompted swallows.

Other: None.
IMPRESSION: Examination limited by clinical status and pain

1.  Symptomatic mild distal esophageal stasis and retropulsion.

2. Transient delay in passage of barium tablet near GE junction
without stricture.

## 2021-09-11 MED ORDER — LIDOCAINE VISCOUS HCL 2 % MT SOLN
15.0000 mL | Freq: Three times a day (TID) | OROMUCOSAL | Status: DC
Start: 2021-09-11 — End: 2021-09-12
  Administered 2021-09-11 – 2021-09-12 (×2): 15 mL via OROMUCOSAL
  Filled 2021-09-11 (×7): qty 15

## 2021-09-11 MED ORDER — METHOCARBAMOL 750 MG PO TABS
750.0000 mg | ORAL_TABLET | Freq: Four times a day (QID) | ORAL | 1 refills | Status: AC
  Filled 2021-09-11: qty 120, 30d supply, fill #0

## 2021-09-11 MED ORDER — OXYCODONE HCL 5 MG PO TABS
5.0000 mg | ORAL_TABLET | ORAL | 0 refills | Status: DC | PRN
  Filled 2021-09-11: qty 20, 4d supply, fill #0

## 2021-09-11 MED ORDER — PANTOPRAZOLE SODIUM 40 MG PO TBEC
40.0000 mg | DELAYED_RELEASE_TABLET | Freq: Two times a day (BID) | ORAL | Status: DC
  Administered 2021-09-11: 40 mg via ORAL
  Filled 2021-09-11: qty 1

## 2021-09-11 MED ORDER — ACETAMINOPHEN 500 MG PO TABS
1000.0000 mg | ORAL_TABLET | Freq: Four times a day (QID) | ORAL | 5 refills | Status: DC | PRN
  Filled 2021-09-11: qty 60, 8d supply, fill #0

## 2021-09-11 MED ORDER — DOCUSATE SODIUM 100 MG PO CAPS
100.0000 mg | ORAL_CAPSULE | Freq: Two times a day (BID) | ORAL | 2 refills | Status: DC
  Filled 2021-09-11: qty 60, 30d supply, fill #0

## 2021-09-11 MED ORDER — IBUPROFEN 600 MG PO TABS
600.0000 mg | ORAL_TABLET | Freq: Four times a day (QID) | ORAL | 1 refills | Status: DC | PRN
  Filled 2021-09-11: qty 120, 30d supply, fill #0

## 2021-09-11 NOTE — Consult Note (Addendum)
? ?                                            Consultation Note ? ? ?Referring Provider: [facility] ?PCP: Mardi Mainland, FNP ?Primary Gastroenterologist: Althia Forts  ?Reason for consultation: Dysphagia  ?Hospital Day: 7 ? ? ?Assessment  ? ?# 71 yo male admitted for ventral hernia repair on 09/05/21.  ? ?# Odynophagia with dysphagia since surgery. Pain in lower esophagus / epigastrium within seconds of swallowing liquids and solids. No obvious oral candida but could still have candida esophagitis. Viral esophagitis? Esophageal stricture?  ? ?# CAD, remote cardiac stent. Home plavix on hold ? ?# See PMH for additional medical problems ? ?Plan  ? ?Will obtain a barium swallow. ?Continue daily PPI.   ? ? ? Attending Physician Note  ? ?I have taken a history, reviewed the chart and examined the patient. I performed more than 50% of this encounter in conjunction with the APP. I agree with the APP's note, impression and recommendations with my edits. My additional impressions and recommendations are as follows.  ? ?*Dysphagia, odynophagia, heartburn, hiccups, lower sternal pain - all new since admission. R/O esophagitis - reflux or infectious, R/O stricture. ?Barium esophagram and pending findings likely will need EGD.  ?Liquid diet for now.  ?Pantoprazole 40 mg po bid.  ?Viscous lidocaine ac & hs. ? ?*S/P ventral hernia repair on 4/18 ? ?Lucio Edward, MD [facility] ?See AMION, [facility], for our on call provider  ? ? ? ?History of Present Illness  ? ?Randall Montoya is a 71 y.o. male with a past medical history significant for  Chronic diastolic heart failure, COPD, HLD, HTN, obesity , hepatic steatosis, diverticulosis, CKD, prediabetes, sleep apnea, CAD s/p stent placement in 2013. See PMH for any additional medical problems. ? ? ?Patient underwent surgery for a incarcerated ventral hernia. Since surgery he has been having sharp epigastric pain every time he swallows solids or liquids. He says  he can feel  the food get move down his esophagus but doesn't feel like it gets lodged. Also since surgery he has been have acid regurgitation and heartburn. Prior to now he had not had had GERD symptoms in many years ( since he used to drink [place] Stalls). He has no lower GI symptoms. He had several BMs after receiving a laxative. No blood in stool. He has never had a colonoscopy  ? ? ?Studies This Admission  ? ?Labs:  ?Recent Labs  ?  09/08/21 ?1118 09/09/21 ?0103 09/10/21 ?1540  ?WBC 12.4* 9.2 7.4  ?HGB 14.5 12.9* 12.3*  ?HCT 44.2 39.2 38.1*  ?PLT 251 241 215  ? ?Recent Labs  ?  09/08/21 ?1118 09/09/21 ?0103 09/10/21 ?0867  ?NA 139 136 138  ?K 4.6 4.5 5.0  ?CL 97* 98 103  ?CO2 34* 29 29  ?GLUCOSE 149* 139* 114*  ?BUN 61* 74* 67*  ?CREATININE 2.54* 2.48* 1.45*  ?CALCIUM 9.6 8.8* 8.3*  ? ?No results for input(s): PROT, ALBUMIN, AST, ALT, ALKPHOS, BILITOT, BILIDIR, IBILI in the last 72 hours. ?No results for input(s): HEPBSAG, HCVAB, HEPAIGM, HEPBIGM in the last 72 hours. ?No results for input(s): LABPROT, INR in the last 72 hours. ? ? ?Imaging:   ?Jan 2023 CTAP w/ contrast  ?IMPRESSION: ?1. Distal colonic diverticulosis without diverticulitis. ?2. Fat containing umbilical hernia. ?3. Hepatic steatosis. ?4.  Aortic  Atherosclerosis (ICD10-I70.0). ? ? ?Past Medical History:  ?Diagnosis Date  ? Arthritis   ? Brain syndrome   ? Cataract   ? Congestive heart failure (CHF) ([place])   ? COPD (chronic obstructive pulmonary disease) ([place])   ? Dyspnea   ? Hyperlipidemia   ? Hypertension   ? Kidney failure   ? Prediabetes   ? Sleep apnea   ? ? ?Past Surgical History:  ?Procedure Laterality Date  ? heart stent    ? INSERTION OF MESH N/A 09/05/2021  ? Procedure: INSERTION OF MESH;  Surgeon: Jesusita Oka, MD;  Location: [facility] OR;  Service: General;  Laterality: N/A;  ? myocardial infarction    ? VENTRAL HERNIA REPAIR N/A 09/05/2021  ? Procedure: LAPAROSCOPIC VENTRAL HERNIA REPAIR;  Surgeon: Jesusita Oka, MD;  Location: [facility];  Service:  General;  Laterality: N/A;  ? ? ?History reviewed. No pertinent family history. ? ?Prior to Admission medications   ?Medication Sig Start Date End Date Taking? Authorizing Provider  ?albuterol (PROVENTIL) (2.5 MG/3ML) 0.083% nebulizer solution Take 3 mLs (2.5 mg total) by nebulization every 4 (four) hours as needed for wheezing or shortness of breath. 06/20/21 06/20/22 Yes Pokhrel, Laxman, MD  ?albuterol (VENTOLIN HFA) 108 (90 Base) MCG/ACT inhaler Inhale 1 puff into the lungs every 6 (six) hours as needed for wheezing or shortness of breath. 03/22/21  Yes [provider]  ?amLODipine (NORVASC) 10 MG tablet Take 10 mg by mouth daily. 03/31/21  Yes [provider]  ?atorvastatin (LIPITOR) 80 MG tablet Take 80 mg by mouth daily. 04/30/21  Yes [provider]  ?bumetanide (BUMEX) 2 MG tablet Take 2 mg by mouth daily. 08/21/21  Yes [provider]  ?cholecalciferol (VITAMIN D3) 25 MCG (1000 UNIT) tablet Take 1,000 Units by mouth daily.   Yes [provider]  ?clopidogrel (PLAVIX) 75 MG tablet Take 75 mg by mouth daily. 04/30/21  Yes [provider]  ?colchicine 0.6 MG tablet Take 1 tablet (0.6 mg total) by mouth daily. 06/20/21  Yes Pokhrel, Laxman, MD  ?gabapentin (NEURONTIN) 100 MG capsule Take 1 capsule (100 mg total) by mouth 2 (two) times daily. 05/06/21  Yes Regalado, Belkys A, MD  ?ibuprofen (ADVIL) 800 MG tablet Take 800 mg by mouth 3 (three) times daily as needed for pain. 08/01/21  Yes [provider]  ?lisinopril (ZESTRIL) 40 MG tablet Take 40 mg by mouth daily. 06/25/21  Yes [provider]  ?metoprolol tartrate (LOPRESSOR) 50 MG tablet Take 50 mg by mouth 2 (two) times daily. 03/31/21  Yes [provider]  ?Multiple Vitamin (MULTIVITAMIN WITH MINERALS) TABS tablet Take 1 tablet by mouth daily.   Yes [provider]  ?nitroGLYCERIN (NITROSTAT) 0.4 MG SL tablet Place 0.4 mg under the tongue every 5 (five) minutes as needed for  chest pain. 02/02/21  Yes [provider]  ?oxybutynin (DITROPAN-XL) 5 MG 24 hr tablet Take 5 mg by mouth daily. 04/30/21  Yes [provider]  ?pantoprazole (PROTONIX) 40 MG tablet Take 1 tablet (40 mg total) by mouth daily. 05/06/21  Yes Regalado, Belkys A, MD  ?tamsulosin (FLOMAX) 0.4 MG CAPS capsule Take 0.4 mg by mouth daily. 04/30/21  Yes [provider]  ?Donnal Debar 100-62.5-25 MCG/ACT AEPB Inhale 1 puff into the lungs daily. 04/11/21  Yes [provider]  ?aspirin EC 81 MG tablet Take 81 mg by mouth daily. Swallow whole.    [provider]  ?guaiFENesin (MUCINEX) 600 MG 12 hr tablet  Take 1 tablet (600 mg total) by mouth 2 (two) times daily. ?Patient not taking: Reported on 06/17/2021 05/06/21   Regalado, Jerald Kief A, MD  ?predniSONE (DELTASONE) 10 MG tablet Take 4 tablets (40 mg) daily for 2 days, then, Take 3 tablets (30 mg) daily for 2 days, then, Take 2 tablets (20 mg) daily for 2 days, then, Take 1 tablets (10 mg) daily for 1 days, then stop ?Patient not taking: Reported on 08/25/2021 06/20/21   Flora Lipps, MD  ? ? ?Current Facility-Administered Medications  ?Medication Dose Route Frequency Provider Last Rate Last Admin  ? acetaminophen (TYLENOL) tablet 1,000 mg  1,000 mg Oral Q6H Lovick, Montel Culver, MD   1,000 mg at 09/11/21 0534  ? albuterol (PROVENTIL) (2.5 MG/3ML) 0.083% nebulizer solution 2.5 mg  2.5 mg Nebulization Q4H PRN Jesusita Oka, MD      ? alum & mag hydroxide-simeth (MAALOX/MYLANTA) 200-200-20 MG/5ML suspension 30 mL  30 mL Oral Q4H PRN Shelly Coss, MD   30 mL at 09/10/21 2216  ? amLODipine (NORVASC) tablet 10 mg  10 mg Oral Daily Jesusita Oka, MD   10 mg at 09/11/21 9924  ? atorvastatin (LIPITOR) tablet 80 mg  80 mg Oral Daily Jesusita Oka, MD   80 mg at 09/11/21 2683  ? bumetanide (BUMEX) tablet 2 mg  2 mg Oral Daily Jesusita Oka, MD   2 mg at 09/11/21 0840  ? colchicine tablet 0.6 mg  0.6 mg Oral Daily Jesusita Oka, MD    0.6 mg at 09/11/21 4196  ? docusate sodium (COLACE) capsule 100 mg  100 mg Oral BID Jesusita Oka, MD   100 mg at 09/09/21 1013  ? enoxaparin (LOVENOX) injection 40 mg  40 mg Subcutaneous Q24H Lovick, Aye

## 2021-09-11 NOTE — Progress Notes (Signed)
Occupational Therapy Treatment ?Patient Details ?Name: Randall Montoya ?MRN: [id number] ?DOB: 09/21/1950 ?Today's Date: 09/11/2021 ? ? ?History of present illness Pt is a 71 y/o male admitted 09/05/21 presenting with ventral hernia; pt also endorses fall on 4/15 with LLE pain, workup negative for acute injury. S/p laparoscopic ventral hernia repair with mesh on 4/18. PMH includes arthritis, brain syndrome, cataracts, CHF, COPD, dyspnea, HLD, HTN, pre-diabetes, OSA on CPAP. ?  ?OT comments ? Patient received in supine and agreeable to OT session. Patient was min guard to get to EOB and to transfer to recliner with RW. Patient was setup for grooming tasks seated. Patient was educated on reacher and sock aide use for LB dressing with demonstration and patient able to return demonstration with min assist. Patient performed static standing to use urinal with min guard. Acute OT to continue to follow with HHOT recommended following discharge.   ? ?Recommendations for follow up therapy are one component of a multi-disciplinary discharge planning process, led by the attending physician.  Recommendations may be updated based on patient status, additional functional criteria and insurance authorization. ?   ?Follow Up Recommendations ? Home health OT  ?  ?Assistance Recommended at Discharge Intermittent Supervision/Assistance  ?Patient can return home with the following ? A lot of help with bathing/dressing/bathroom;Assistance with cooking/housework;Assist for transportation;Help with stairs or ramp for entrance;Direct supervision/assist for medications management;Direct supervision/assist for financial management;A little help with walking and/or transfers ?  ?Equipment Recommendations ? Tub/shower seat  ?  ?Recommendations for Other Services   ? ?  ?Precautions / Restrictions Precautions ?Precautions: Fall ?Precaution Comments: abdominal precautions for comfort s/p hernia repair ?Restrictions ?Weight Bearing Restrictions: No   ? ? ?  ? ?Mobility Bed Mobility ?Overal bed mobility: Needs Assistance ?Bed Mobility: Supine to Sit ?  ?  ?Supine to sit: Min guard ?  ?  ?General bed mobility comments: able to get to EOB due to less pain ?  ? ?Transfers ?Overall transfer level: Needs assistance ?Equipment used: Rolling walker (2 wheels) ?Transfers: Sit to/from Stand ?Sit to Stand: Min guard ?Stand pivot transfers: Min guard ?  ?  ?  ?  ?General transfer comment: step pivot transfer to recliner from EOB ?  ?  ?Balance Overall balance assessment: Needs assistance, History of Falls ?Sitting-balance support: Feet supported ?Sitting balance-Leahy Scale: Good ?  ?  ?Standing balance support: Single extremity supported, During functional activity ?Standing balance-Leahy Scale: Fair ?Standing balance comment: able to stand to use urinal ?  ?  ?  ?  ?  ?  ?  ?  ?  ?  ?  ?   ? ?ADL either performed or assessed with clinical judgement  ? ?ADL Overall ADL's : Needs assistance/impaired ?  ?  ?Grooming: Wash/dry hands;Wash/dry face;Oral care;Set up;Sitting ?Grooming Details (indicate cue type and reason): in recliner ?  ?  ?  ?  ?  ?  ?Lower Body Dressing: Minimal assistance;With adaptive equipment;Sitting/lateral leans ?Lower Body Dressing Details (indicate cue type and reason): education on AE use for LB dressing with reacher and sock aide ?Toilet Transfer: Min guard ?Toilet Transfer Details (indicate cue type and reason): simulated to chair ?  ?  ?  ?  ?  ?General ADL Comments: less pain on this date with improvement with transfers ?  ? ?Extremity/Trunk Assessment   ?  ?  ?  ?  ?  ? ?Vision   ?  ?  ?Perception   ?  ?Praxis   ?  ? ?  Cognition Arousal/Alertness: Awake/alert ?Behavior During Therapy: [facility] for tasks assessed/performed ?Overall Cognitive Status: Within Functional Limits for tasks assessed ?  ?  ?  ?  ?  ?  ?  ?  ?  ?  ?  ?  ?  ?  ?  ?  ?General Comments: eager to get out of bed with OT ?  ?  ?   ?Exercises   ? ?  ?Shoulder Instructions   ? ? ?   ?General Comments    ? ? ?Pertinent Vitals/ Pain       Pain Assessment ?Pain Assessment: No/denies pain ?Pain Location: Only has discomfort with coughing and swallowing. ?Pain Intervention(s): Monitored during session ? ?Home Living   ?  ?  ?  ?  ?  ?  ?  ?  ?  ?  ?  ?  ?  ?  ?  ?  ?  ?  ? ?  ?Prior Functioning/Environment    ?  ?  ?  ?   ? ?Frequency ? Min 2X/week  ? ? ? ? ?  ?Progress Toward Goals ? ?OT Goals(current goals can now be found in the care plan section) ? Progress towards OT goals: Progressing toward goals ? ?Acute Rehab OT Goals ?Patient Stated Goal: get better ?OT Goal Formulation: With patient ?Time For Goal Achievement: 09/20/21 ?Potential to Achieve Goals: Good ?ADL Goals ?Pt Will Perform Grooming: with set-up;standing;sitting ?Pt Will Perform Upper Body Dressing: with min guard assist;sitting;standing ?Pt Will Perform Lower Body Dressing: with mod assist;sit to/from stand;sitting/lateral leans ?Pt Will Transfer to Toilet: with min assist;squat pivot transfer;stand pivot transfer;bedside commode ?Pt Will Perform Tub/Shower Transfer: 3 in 1;rolling walker;Tub transfer;Shower transfer;ambulating  ?Plan Discharge plan remains appropriate   ? ?Co-evaluation ? ? ?   ?  ?  ?  ?  ? ?  ?AM-PAC OT "6 Clicks" Daily Activity     ?Outcome Measure ? ? Help from another person eating meals?: None ?Help from another person taking care of personal grooming?: A Little ?Help from another person toileting, which includes using toliet, bedpan, or urinal?: A Little ?Help from another person bathing (including washing, rinsing, drying)?: A Little ?Help from another person to put on and taking off regular upper body clothing?: A Little ?Help from another person to put on and taking off regular lower body clothing?: A Lot ?6 Click Score: 18 ? ?  ?End of Session Equipment Utilized During Treatment: Rolling walker (2 wheels);Oxygen ? ?OT Visit Diagnosis: Unsteadiness on feet (R26.81);Other abnormalities of gait and  mobility (R26.89);Muscle weakness (generalized) (M62.81) ?  ?Activity Tolerance Patient tolerated treatment well ?  ?Patient Left in chair;with call bell/phone within reach;with chair alarm set ?  ?Nurse Communication Mobility status ?  ? ?   ? ?Time: 2536-6440 ?OT Time Calculation (min): 27 min ? ?Charges: OT General Charges ?$OT Visit: 1 Visit ?OT Treatments ?$Self Care/Home Management : 23-37 mins ? ?Lodema Hong, OTA ?Acute Rehabilitation Services  ?Pager [phone] ?Office [phone] ? ? ?[place] ?09/11/2021, 12:00 PM ?

## 2021-09-11 NOTE — H&P (View-Only) (Signed)
? ?                                            Consultation Note ? ? ?Referring Provider: [facility] ?PCP: Mardi Mainland, FNP ?Primary Gastroenterologist: Althia Forts  ?Reason for consultation: Dysphagia  ?Hospital Day: 7 ? ? ?Assessment  ? ?# 71 yo male admitted for ventral hernia repair on 09/05/21.  ? ?# Odynophagia with dysphagia since surgery. Pain in lower esophagus / epigastrium within seconds of swallowing liquids and solids. No obvious oral candida but could still have candida esophagitis. Viral esophagitis? Esophageal stricture?  ? ?# CAD, remote cardiac stent. Home plavix on hold ? ?# See PMH for additional medical problems ? ?Plan  ? ?Will obtain a barium swallow. ?Continue daily PPI.   ? ? ? Attending Physician Note  ? ?I have taken a history, reviewed the chart and examined the patient. I performed more than 50% of this encounter in conjunction with the APP. I agree with the APP's note, impression and recommendations with my edits. My additional impressions and recommendations are as follows.  ? ?*Dysphagia, odynophagia, heartburn, hiccups, lower sternal pain - all new since admission. R/O esophagitis - reflux or infectious, R/O stricture. ?Barium esophagram and pending findings likely will need EGD.  ?Liquid diet for now.  ?Pantoprazole 40 mg po bid.  ?Viscous lidocaine ac & hs. ? ?*S/P ventral hernia repair on 4/18 ? ?Lucio Edward, MD [facility] ?See AM[facility], [facility], for our on call provider  ? ? ? ?History of Present Illness  ? ?Randall Montoya is a 71 y.o. male with a past medical history significant for  Chronic diastolic heart failure, COPD, HLD, HTN, obesity , hepatic steatosis, diverticulosis, CKD, prediabetes, sleep apnea, CAD s/p stent placement in 2013. See PMH for any additional medical problems. ? ? ?Patient underwent surgery for a incarcerated ventral hernia. Since surgery he has been having sharp epigastric pain every time he swallows solids or liquids. He says  he can feel  the food get move down his esophagus but doesn't feel like it gets lodged. Also since surgery he has been have acid regurgitation and heartburn. Prior to now he had not had had GERD symptoms in many years ( since he used to drink Shearon Stalls). He has no lower GI symptoms. He had several BMs after receiving a laxative. No blood in stool. He has never had a colonoscopy  ? ? ?Studies This Admission  ? ?Labs:  ?Recent Labs  ?  09/08/21 ?1118 09/09/21 ?0103 09/10/21 ?2202  ?WBC 12.4* 9.2 7.4  ?HGB 14.5 12.9* 12.3*  ?HCT 44.2 39.2 38.1*  ?PLT 251 241 215  ? ?Recent Labs  ?  09/08/21 ?1118 09/09/21 ?0103 09/10/21 ?5427  ?NA 139 136 138  ?K 4.6 4.5 5.0  ?CL 97* 98 103  ?CO2 34* 29 29  ?GLUCOSE 149* 139* 114*  ?BUN 61* 74* 67*  ?CREATININE 2.54* 2.48* 1.45*  ?CALCIUM 9.6 8.8* 8.3*  ? ?No results for input(s): PROT, ALBUMIN, AST, ALT, ALKPHOS, BILITOT, BILIDIR, IBILI in the last 72 hours. ?No results for input(s): HEPBSAG, HCVAB, HEPAIGM, HEPBIGM in the last 72 hours. ?No results for input(s): LABPROT, INR in the last 72 hours. ? ? ?Imaging:   ?Jan 2023 CTAP w/ contrast  ?IMPRESSION: ?1. Distal colonic diverticulosis without diverticulitis. ?2. Fat containing umbilical hernia. ?3. Hepatic steatosis. ?4.  Aortic  Atherosclerosis (ICD10-I70.0). ? ? ?Past Medical History:  ?Diagnosis Date  ? Arthritis   ? Brain syndrome   ? Cataract   ? Congestive heart failure (CHF) ([place])   ? COPD (chronic obstructive pulmonary disease) ([place])   ? Dyspnea   ? Hyperlipidemia   ? Hypertension   ? Kidney failure   ? Prediabetes   ? Sleep apnea   ? ? ?Past Surgical History:  ?Procedure Laterality Date  ? heart stent    ? INSERTION OF MESH N/A 09/05/2021  ? Procedure: INSERTION OF MESH;  Surgeon: Jesusita Oka, MD;  Location: [facility] OR;  Service: General;  Laterality: N/A;  ? myocardial infarction    ? VENTRAL HERNIA REPAIR N/A 09/05/2021  ? Procedure: LAPAROSCOPIC VENTRAL HERNIA REPAIR;  Surgeon: Jesusita Oka, MD;  Location: [facility];  Service:  General;  Laterality: N/A;  ? ? ?History reviewed. No pertinent family history. ? ?Prior to Admission medications   ?Medication Sig Start Date End Date Taking? Authorizing Provider  ?albuterol (PROVENTIL) (2.5 MG/3ML) 0.083% nebulizer solution Take 3 mLs (2.5 mg total) by nebulization every 4 (four) hours as needed for wheezing or shortness of breath. 06/20/21 06/20/22 Yes Pokhrel, Laxman, MD  ?albuterol (VENTOLIN HFA) 108 (90 Base) MCG/ACT inhaler Inhale 1 puff into the lungs every 6 (six) hours as needed for wheezing or shortness of breath. 03/22/21  Yes [provider]  ?amLODipine (NORVASC) 10 MG tablet Take 10 mg by mouth daily. 03/31/21  Yes [provider]  ?atorvastatin (LIPITOR) 80 MG tablet Take 80 mg by mouth daily. 04/30/21  Yes [provider]  ?bumetanide (BUMEX) 2 MG tablet Take 2 mg by mouth daily. 08/21/21  Yes [provider]  ?cholecalciferol (VITAMIN D3) 25 MCG (1000 UNIT) tablet Take 1,000 Units by mouth daily.   Yes [provider]  ?clopidogrel (PLAVIX) 75 MG tablet Take 75 mg by mouth daily. 04/30/21  Yes [provider]  ?colchicine 0.6 MG tablet Take 1 tablet (0.6 mg total) by mouth daily. 06/20/21  Yes Pokhrel, Laxman, MD  ?gabapentin (NEURONTIN) 100 MG capsule Take 1 capsule (100 mg total) by mouth 2 (two) times daily. 05/06/21  Yes Regalado, Belkys A, MD  ?ibuprofen (ADVIL) 800 MG tablet Take 800 mg by mouth 3 (three) times daily as needed for pain. 08/01/21  Yes [provider]  ?lisinopril (ZESTRIL) 40 MG tablet Take 40 mg by mouth daily. 06/25/21  Yes [provider]  ?metoprolol tartrate (LOPRESSOR) 50 MG tablet Take 50 mg by mouth 2 (two) times daily. 03/31/21  Yes [provider]  ?Multiple Vitamin (MULTIVITAMIN WITH MINERALS) TABS tablet Take 1 tablet by mouth daily.   Yes [provider]  ?nitroGLYCERIN (NITROSTAT) 0.4 MG SL tablet Place 0.4 mg under the tongue every 5 (five) minutes as needed for  chest pain. 02/02/21  Yes [provider]  ?oxybutynin (DITROPAN-XL) 5 MG 24 hr tablet Take 5 mg by mouth daily. 04/30/21  Yes [provider]  ?pantoprazole (PROTONIX) 40 MG tablet Take 1 tablet (40 mg total) by mouth daily. 05/06/21  Yes Regalado, Belkys A, MD  ?tamsulosin (FLOMAX) 0.4 MG CAPS capsule Take 0.4 mg by mouth daily. 04/30/21  Yes [provider]  ?Donnal Debar 100-62.5-25 MCG/ACT AEPB Inhale 1 puff into the lungs daily. 04/11/21  Yes [provider]  ?aspirin EC 81 MG tablet Take 81 mg by mouth daily. Swallow whole.    [provider]  ?guaiFENesin (MUCINEX) 600 MG 12 hr tablet  Take 1 tablet (600 mg total) by mouth 2 (two) times daily. ?Patient not taking: Reported on 06/17/2021 05/06/21   Regalado, Jerald Kief A, MD  ?predniSONE (DELTASONE) 10 MG tablet Take 4 tablets (40 mg) daily for 2 days, then, Take 3 tablets (30 mg) daily for 2 days, then, Take 2 tablets (20 mg) daily for 2 days, then, Take 1 tablets (10 mg) daily for 1 days, then stop ?Patient not taking: Reported on 08/25/2021 06/20/21   Flora Lipps, MD  ? ? ?Current Facility-Administered Medications  ?Medication Dose Route Frequency Provider Last Rate Last Admin  ? acetaminophen (TYLENOL) tablet 1,000 mg  1,000 mg Oral Q6H Lovick, Montel Culver, MD   1,000 mg at 09/11/21 0534  ? albuterol (PROVENTIL) (2.5 MG/3ML) 0.083% nebulizer solution 2.5 mg  2.5 mg Nebulization Q4H PRN Jesusita Oka, MD      ? alum & mag hydroxide-simeth (MAALOX/MYLANTA) 200-200-20 MG/5ML suspension 30 mL  30 mL Oral Q4H PRN Shelly Coss, MD   30 mL at 09/10/21 2216  ? amLODipine (NORVASC) tablet 10 mg  10 mg Oral Daily Jesusita Oka, MD   10 mg at 09/11/21 6767  ? atorvastatin (LIPITOR) tablet 80 mg  80 mg Oral Daily Jesusita Oka, MD   80 mg at 09/11/21 2094  ? bumetanide (BUMEX) tablet 2 mg  2 mg Oral Daily Jesusita Oka, MD   2 mg at 09/11/21 0840  ? colchicine tablet 0.6 mg  0.6 mg Oral Daily Jesusita Oka, MD    0.6 mg at 09/11/21 7096  ? docusate sodium (COLACE) capsule 100 mg  100 mg Oral BID Jesusita Oka, MD   100 mg at 09/09/21 1013  ? enoxaparin (LOVENOX) injection 40 mg  40 mg Subcutaneous Q24H Lovick, Aye

## 2021-09-11 NOTE — Care Management Important Message (Signed)
Important Message ? ?Patient Details  ?Name: Randall Montoya ?MRN: [id number] ?Date of Birth: [birth year] ? ? ?Medicare Important Message Given:  Yes ? ? ? ? ?Randall Montoya ?09/11/2021, 4:38 PM ?

## 2021-09-11 NOTE — Care Management (Addendum)
Was asked to arrange transport.  ? ?Patient uses [facility] for transport and would like them to transport him home in wheel chair Lucianne Lei, he has portable oxygen , 3 in1 and wheel chair in room. Patient has assistance inside and key . ? ?[facility] [phone] will have w/c van downstairs at noon to transport patient home. He said they can take 3 in1 , w/c and oxygen . I asked the scheduler to have driver call [phone] when driver arrives is down there.  ? ?Secure chatted nurse. Patient aware  ? ?Patient needs a scan prior to discharge , unsure time of scan and when patient will be ready for discharge.  ? ?NCM cancelled [facility] , the latest they can pick patient up is 5 pm. Nurse aware  ? ?Marjory Lies with [facility] aware  ? ? ?27 Nurse secure chatted that patient will be ready for transport home at 5 pm. [employer] had come and pick up all DME. NCM called [facility] and confirmed they can transport patient home at 5 pm and transport oxygen, 3 in 1 and wheel chair. They will call nurse when they arrive at hospital. [facility] with [place] aware and will have wheel chair , oxygen and 3 in1 delivered to room again prior to [place] aware and will deliver medications.  ? ? ?1625 Patient staying [place] cancelled  ?

## 2021-09-11 NOTE — Progress Notes (Signed)
Physical Therapy Treatment ?Patient Details ?Name: Randall Montoya ?MRN: [id number] ?DOB: 27-May-1950 ?Today's Date: 09/11/2021 ? ? ?History of Present Illness Pt is a 71 y/o male admitted 09/05/21 presenting with ventral hernia; pt also endorses fall on 4/15 with LLE pain, workup negative for acute injury. S/p laparoscopic ventral hernia repair with mesh on 4/18. PMH includes arthritis, brain syndrome, cataracts, CHF, COPD, dyspnea, HLD, HTN, pre-diabetes, OSA on CPAP. ? ?  ?PT Comments  ? ? Pt declines functional mobility at this time secondary to just finishing treatment session with OT.  Per pt, his D/C has been canceled while he waits to complete MBS testing.  Pt is agreeable to therex only at this time and demos good tolerance to activity.  Pt is encouraged to continue to do therex throughout day to maintain LE strength while he is not mobilizing as much.  Demos understanding.  PT to continue to f/u and promote OOB as able.  ?Recommendations for follow up therapy are one component of a multi-disciplinary discharge planning process, led by the attending physician.  Recommendations may be updated based on patient status, additional functional criteria and insurance authorization. ? ?Follow Up Recommendations ? Home health PT ?  ?  ?Assistance Recommended at Discharge    ?Patient can return home with the following   ?  ?Equipment Recommendations ?    ?  ?Recommendations for Other Services   ? ? ?  ?Precautions / Restrictions Precautions ?Precautions: Fall ?Precaution Comments: abdominal precautions for comfort s/p hernia repair ?Restrictions ?Weight Bearing Restrictions: No  ?  ? ?Mobility ? Bed Mobility ?  ?  ?  ?  ?  ?  ?  ?  ?  ? ?Transfers ?  ?  ?  ?  ?  ?  ?  ?  ?  ?  ?  ? ?Ambulation/Gait ?  ?  ?  ?  ?  ?  ?  ?  ? ? ?Stairs ?  ?  ?  ?  ?  ? ? ?Wheelchair Mobility ?  ? ?Modified Rankin (Stroke Patients Only) ?  ? ? ?  ?Balance   ?  ?  ?  ?  ?  ?  ?  ?  ?  ?  ?  ?  ?  ?  ?  ?  ?  ?  ?  ? ?  ?Cognition  Arousal/Alertness: Awake/alert ?Behavior During Therapy: [facility] for tasks assessed/performed ?Overall Cognitive Status: Within Functional Limits for tasks assessed ?  ?  ?  ?  ?  ?  ?  ?  ?  ?  ?  ?  ?  ?  ?  ?  ?General Comments: Pt refuses functional mobility with PT, states he "just got back to bed from working with OT 5 min ago".  Alos reports that his D/C home today has been canceled as he is going to be going for an MBS test at 2 pm.  Agreeable to do therex in bed with encouragement from PT. ?  ?  ? ?  ?Exercises General Exercises - Lower Extremity ?Ankle Circles/Pumps: AROM, Both, 20 reps ?Long Arc Quad: AROM, Both, 20 reps ?Heel Slides: AROM, Both, 20 reps ?Hip ABduction/ADduction: AROM, Both, 20 reps ? ?  ?General Comments General comments (skin integrity, edema, etc.): Educated on using pillow to brace abdomen to improve comfort with coughing but pt. declines to follow advice at this time. ?  ?  ? ?Pertinent Vitals/Pain Pain Assessment ?Pain Assessment: No/denies  pain ?Pain Location: Only has discomfort with coughing and swallowing.  ? ? ?Home Living   ?  ?  ?  ?  ?  ?  ?  ?  ?  ?   ?  ?Prior Function    ?  ?  ?   ? ?PT Goals (current goals can now be found in the care plan section) Progress towards PT goals: Progressing toward goals ? ?  ?Frequency ? ? ? Min 5X/week ? ? ? ?  ?PT Plan Current plan remains appropriate  ? ? ?Co-evaluation   ?  ?  ?  ?  ? ?  ?AM-PAC PT "6 Clicks" Mobility   ?Outcome Measure ? Help needed turning from your back to your side while in a flat bed without using bedrails?: None ?Help needed moving from lying on your back to sitting on the side of a flat bed without using bedrails?: A Little ?Help needed moving to and from a bed to a chair (including a wheelchair)?: A Little ?Help needed standing up from a chair using your arms (e.g., wheelchair or bedside chair)?: A Little ?Help needed to walk in hospital room?: A Little ?Help needed climbing 3-5 steps with a railing? : A Little ?6  Click Score: 19 ? ?  ?End of Session   ?Activity Tolerance: Patient tolerated treatment well ?Patient left: in bed;with call bell/phone within reach ?  ?  ?  ? ? ?Time: 9629-5284 ?PT Time Calculation (min) (ACUTE ONLY): 18 min ? ?Charges:  $Therapeutic Exercise: 8-22 mins          ?          ?Aliayah Tyer A. Vallie Teters, PT, DPT ?Acute Rehabilitation Services ?Office: [phone]  ? ? ?[place] ?09/11/2021, 11:42 AM ? ?

## 2021-09-11 NOTE — Discharge Instructions (Signed)
May shower. Do not peel off or scrub skin glue. May allow warm soapy water to run over incision, then rinse and pat dry. Do not soak in any water (tubs, hot tubs, pools, lakes, oceans) for one week after surgery.  ? ?No lifting greater than 5 pounds for six weeks. May resume sexual activity when it is comfortable.  ? ?Pain regimen: take over-the-counter tylenol (acetaminophen) '1000mg'$  every six hours, the prescription ibuprofen ('600mg'$ ) every six hours and the robaxin (methocarbamol) '750mg'$  every six hours. With all three of these, you should be taking something every two hours. Example: tylenol ( acetaminophen) at 8am, ibuprofen at 10am, robaxin (methocarbamol) at 12pm, tylenol (acetaminophen) again at 2pm, ibuprofen again at 4pm, robaxin (methocarbamol) at 6pm. You also have a prescription for oxycodone, which should be taken if the tylenol (acetaminophen), ibuprofen, and robaxin (methocarbamol) are not enough to control your pain. You may take the oxycodone as frequently as every four hours as needed, but if you are taking the other medications as above, you should not need the oxycodone this frequently. You have also been given a prescription for colace (docusate) which is a stool softener. Please take this as prescribed because the oxycodone can cause constipation and the colace (docusate) will minimize or prevent constipation. Do not drive while taking or under the influence of the oxycodone as it is a narcotic medication. ? ?Call the office at [phone] for temperature greater than 101.5F, worsening pain, redness or warmth at the incision site. ? ?Please call [phone] to make an appointment for 2-3 weeks after surgery for wound check.  ? ?

## 2021-09-11 NOTE — Evaluation (Signed)
Clinical/Bedside Swallow Evaluation ?Patient Details  ?Name: Randall Montoya ?MRN: [id number] ?Date of Birth: July 11, 1950 ? ?Today's Date: 09/11/2021 ?Time: SLP Start Time (ACUTE ONLY): 6433 SLP Stop Time (ACUTE ONLY): 2951 ?SLP Time Calculation (min) (ACUTE ONLY): 11 min ? ?Past Medical History:  ?Past Medical History:  ?Diagnosis Date  ? Arthritis   ? Brain syndrome   ? Cataract   ? Congestive heart failure (CHF) ([place])   ? COPD (chronic obstructive pulmonary disease) ([facility])   ? Dyspnea   ? Hyperlipidemia   ? Hypertension   ? Kidney failure   ? Prediabetes   ? Sleep apnea   ? ?Past Surgical History:  ?Past Surgical History:  ?Procedure Laterality Date  ? heart stent    ? INSERTION OF MESH N/A 09/05/2021  ? Procedure: INSERTION OF MESH;  Surgeon: Jesusita Oka, MD;  Location: [facility];  Service: General;  Laterality: N/A;  ? myocardial infarction    ? VENTRAL HERNIA REPAIR N/A 09/05/2021  ? Procedure: LAPAROSCOPIC VENTRAL HERNIA REPAIR;  Surgeon: Jesusita Oka, MD;  Location: [facility];  Service: General;  Laterality: N/A;  ? ?HPI:  ?Pt is a 71 yo male s/p surgery 4/18 for incarcerated ventral hernia. Course complicated post-op by sharp epigastric pain with swallow (solids or liquids), acid regurgitation, and heartburn. GI following with esophagram pending. PMH includes: CHF, COPD, HLD, HTN, obesity, hepatic steatosis, diverticulosis, CKD, prediabetes, sleep apnea, CAD s/p stent placement in 2013, brain syndrome  ?  ?Assessment / Plan / Recommendation  ?Clinical Impression ? Pt's oral motor exam is unremarkable and he has no overt signs of oropharyngeal dysphagia while consuming thin liquids. He declined any solids offered due to reports of pain with swallowing, rubbing throughout his chest when indicating where he feels this sharp pain. Note that GI is already involved with pt's esophageram pending for later today (made NPO after this eval). Suspect that his symptoms will be better evaluated by GI; however, will at least  f/u to see results of esophagram to see if SLP can provide any further assistance. ?SLP Visit Diagnosis: Dysphagia, unspecified (R13.10) ?   ?Aspiration Risk ? Risk for inadequate nutrition/hydration  ?  ?Diet Recommendation Regular;Thin liquid  ? ?Liquid Administration via: Cup;Straw ?Medication Administration: Whole meds with liquid ?Supervision: Patient able to self feed ?Compensations: Slow rate;Small sips/bites;Follow solids with liquid ?Postural Changes: Seated upright at 90 degrees;Remain upright for at least 30 minutes after po intake  ?  ?Other  Recommendations Oral Care Recommendations: Oral care BID   ? ?Recommendations for follow up therapy are one component of a multi-disciplinary discharge planning process, led by the attending physician.  Recommendations may be updated based on patient status, additional functional criteria and insurance authorization. ? ?Follow up Recommendations Other (comment) (TBA)  ? ? ?  ?Assistance Recommended at Discharge PRN  ?Functional Status Assessment Patient has had a recent decline in their functional status and demonstrates the ability to make significant improvements in function in a reasonable and predictable amount of time.  ?Frequency and Duration    ?  ?  ?   ? ?Prognosis Prognosis for Safe Diet Advancement: Good  ? ?  ? ?Swallow Study   ?General HPI: Pt is a 71 yo male s/p surgery 4/18 for incarcerated ventral hernia. Course complicated post-op by sharp epigastric pain with swallow (solids or liquids), acid regurgitation, and heartburn. GI following with esophagram pending. PMH includes: CHF, COPD, HLD, HTN, obesity, hepatic steatosis, diverticulosis, CKD, prediabetes, sleep apnea, CAD s/p  stent placement in 2013, brain syndrome ?Type of Study: Bedside Swallow Evaluation ?Previous Swallow Assessment: none in chart ?Diet Prior to this Study: Regular;Thin liquids ?Temperature Spikes Noted: No ?Respiratory Status: Nasal cannula ?History of Recent Intubation:  No ?Behavior/Cognition: Alert;Cooperative ?Oral Cavity Assessment: Within Functional Limits ?Oral Care Completed by SLP: No ?Oral Cavity - Dentition: Dentures, top;Dentures, bottom ?Vision: Functional for self-feeding ?Patient Positioning: Upright in chair ?Baseline Vocal Quality: Normal ?Volitional Cough: Strong ?Volitional Swallow: Able to elicit  ?  ?Oral/Motor/Sensory Function Overall Oral Motor/Sensory Function: Within functional limits   ?Ice Chips Ice chips: Not tested   ?Thin Liquid Thin Liquid: Within functional limits ?Presentation: Self Fed;Straw  ?  ?Nectar Thick Nectar Thick Liquid: Not tested   ?Honey Thick Honey Thick Liquid: Not tested   ?Puree Puree: Not tested   ?Solid ? ? ?  Solid: Not tested  ? ?  ? ?Osie Bond., M.A. CCC-SLP ?Acute Rehabilitation Services ?Office [phone] ? ?Secure chat preferred ? ?09/11/2021,11:05 AM ? ? ? ?

## 2021-09-11 NOTE — Progress Notes (Signed)
? ?  General Surgery Follow Up Note ? ?Subjective:  ?  ?Overnight Issues:  ? ?Objective:  ?Vital signs for last 24 hours: ?Temp:  [97.6 ?F (36.4 ?C)-98.3 ?F (36.8 ?C)] 97.6 ?F (36.4 ?C) (04/24 2774) ?Pulse Rate:  [61-80] 61 (04/24 0640) ?Resp:  [18-20] 18 (04/24 0640) ?BP: (122-130)/(56-61) 126/59 (04/24 0640) ?SpO2:  [92 %-96 %] 96 % (04/24 0640) ? ?Hemodynamic parameters for last 24 hours: ?  ? ?Intake/Output from previous day: ?04/23 0701 - 04/24 0700 ?In: 5289 [P.O.:200; I.V.:5089] ?Out: [place] [Urine:875]  ?Intake/Output this shift: ?Total I/O ?In: 5089 [I.V.:5089] ?Out: 675 [Urine:675] ? ?Vent settings for last 24 hours: ?  ? ?Physical Exam:  ?Gen: comfortable, no distress ?Neuro: non-focal exam ?HEENT: PERRL ?Neck: supple ?CV: RRR ?Pulm: unlabored breathing ?Abd: soft, NT ?GU: clear yellow urine ?Extr: wwp, no edema ? ? ?No results found for this or any previous visit (from the past 24 hour(s)). ? ?Assessment & Plan: ?The plan of care was discussed with the bedside nurse for the day, who is in agreement with this plan and no additional concerns were raised.  ? ?Present on Admission: ?**None** ? ? ? LOS: 4 days  ? ?Additional comments:I reviewed the patient's new clinical lab test results.   and I reviewed the patients new imaging test results.   ? ?VH - s/p lap VHR with mesh 4/18, multiple BMs, still having nausea/belching/epigastric pain causing PO avoidance ?LLE pain - s/p fall 4/16, XR done pre-op and negative, PT/OT eval recs for HHPT, flutter valve ?FEN - reg diet ?DVT - SCDs, LMWH ?Dispo - today after GI eval ? ? ?Randall Oka, MD ?Trauma & General Surgery ?Please use [email or web address] to contact on call provider ? ?09/11/2021 ? ?*Care during the described time interval was provided by me. I have reviewed this patient's available data, including medical history, events of note, physical examination and test results as part of my evaluation. ? ?

## 2021-09-12 ENCOUNTER — Inpatient Hospital Stay (HOSPITAL_COMMUNITY): Payer: Medicare Other | Admitting: Certified Registered Nurse Anesthetist

## 2021-09-12 ENCOUNTER — Encounter (HOSPITAL_COMMUNITY)
Admission: RE | Disposition: A | Discharge status: Home Health | Payer: Self-pay | Source: Home / Self Care | Attending: Surgery

## 2021-09-12 ENCOUNTER — Other Ambulatory Visit (HOSPITAL_COMMUNITY): Payer: Self-pay

## 2021-09-12 ENCOUNTER — Encounter (HOSPITAL_COMMUNITY): Payer: Self-pay | Admitting: Surgery

## 2021-09-12 DIAGNOSIS — K3189 Other diseases of stomach and duodenum: Secondary | ICD-10-CM

## 2021-09-12 DIAGNOSIS — R1310 Dysphagia, unspecified: Secondary | ICD-10-CM

## 2021-09-12 DIAGNOSIS — I130 Hypertensive heart and chronic kidney disease with heart failure and stage 1 through stage 4 chronic kidney disease, or unspecified chronic kidney disease: Secondary | ICD-10-CM

## 2021-09-12 DIAGNOSIS — N189 Chronic kidney disease, unspecified: Secondary | ICD-10-CM

## 2021-09-12 DIAGNOSIS — K2100 Gastro-esophageal reflux disease with esophagitis, without bleeding: Secondary | ICD-10-CM | POA: Diagnosis not present

## 2021-09-12 DIAGNOSIS — I2510 Atherosclerotic heart disease of native coronary artery without angina pectoris: Secondary | ICD-10-CM

## 2021-09-12 DIAGNOSIS — I5032 Chronic diastolic (congestive) heart failure: Secondary | ICD-10-CM

## 2021-09-12 HISTORY — PX: BIOPSY: SHX5522

## 2021-09-12 HISTORY — PX: ESOPHAGOGASTRODUODENOSCOPY (EGD) WITH PROPOFOL: SHX5813

## 2021-09-12 SURGERY — ESOPHAGOGASTRODUODENOSCOPY (EGD) WITH PROPOFOL
Anesthesia: Monitor Anesthesia Care

## 2021-09-12 MED ORDER — SODIUM CHLORIDE 0.9 % IV SOLN: INTRAVENOUS | Status: DC

## 2021-09-12 MED ORDER — SUCRALFATE 1 GM/10ML PO SUSP
1.0000 g | Freq: Three times a day (TID) | ORAL | 0 refills | Status: DC
Start: 2021-09-12 — End: 2021-10-17
  Filled 2021-09-12: qty 420, 11d supply, fill #0

## 2021-09-12 MED ORDER — SUCRALFATE 1 GM/10ML PO SUSP
1.0000 g | Freq: Three times a day (TID) | ORAL | Status: DC
  Administered 2021-09-12: 1 g via ORAL
  Filled 2021-09-12: qty 10

## 2021-09-12 MED ORDER — FAMOTIDINE 40 MG PO TABS
40.0000 mg | ORAL_TABLET | Freq: Every day | ORAL | 1 refills | Status: DC
  Filled 2021-09-12: qty 30, 30d supply, fill #0

## 2021-09-12 MED ORDER — PANTOPRAZOLE SODIUM 40 MG PO TBEC
40.0000 mg | DELAYED_RELEASE_TABLET | Freq: Two times a day (BID) | ORAL | 3 refills | Status: DC
Start: 2021-09-12 — End: 2021-10-17
  Filled 2021-09-12: qty 60, 30d supply, fill #0

## 2021-09-12 MED ORDER — LIDOCAINE 2% (20 MG/ML) 5 ML SYRINGE
INTRAMUSCULAR | Status: DC | PRN
  Administered 2021-09-12: 40 mg via INTRAVENOUS
  Administered 2021-09-12: 60 mg via INTRAVENOUS

## 2021-09-12 MED ORDER — PROPOFOL 500 MG/50ML IV EMUL
INTRAVENOUS | Status: DC | PRN
  Administered 2021-09-12: 75 ug/kg/min via INTRAVENOUS

## 2021-09-12 MED ORDER — PROPOFOL 10 MG/ML IV BOLUS
INTRAVENOUS | Status: DC | PRN
  Administered 2021-09-12 (×2): 20 mg via INTRAVENOUS
  Administered 2021-09-12: 25 mg via INTRAVENOUS

## 2021-09-12 MED ORDER — FAMOTIDINE 20 MG PO TABS
40.0000 mg | ORAL_TABLET | Freq: Every day | ORAL | Status: DC
  Filled 2021-09-12: qty 1

## 2021-09-12 SURGICAL SUPPLY — 15 items

## 2021-09-12 NOTE — Anesthesia Preprocedure Evaluation (Signed)
Anesthesia Evaluation  ?Patient identified by MRN, date of birth, ID band ?Patient awake ? ? ? ?Reviewed: ?Allergy & Precautions, NPO status , Patient's Chart, lab work & pertinent test results ? ?History of Anesthesia Complications ?Negative for: history of anesthetic complications ? ?Airway ?Mallampati: II ? ?TM Distance: >3 FB ?Neck ROM: Full ? ? ? Dental ? ?(+) Edentulous Upper, Edentulous Lower, Dental Advisory Given ?  ?Pulmonary ?shortness of breath, sleep apnea and Continuous Positive Airway Pressure Ventilation , COPD,  COPD inhaler, former smoker,  ?  ? ?+ decreased breath sounds ? ? ? ? ? Cardiovascular ?hypertension, Pt. on medications ?+ CAD, + Cardiac Stents and +CHF  ? ?Rhythm:Regular  ??1. Left ventricular ejection fraction, by estimation, is 60 to 65%. The  ?left ventricle has normal function. The left ventricle has no regional  ?wall motion abnormalities. Left ventricular diastolic parameters are  ?consistent with Grade I diastolic  ?dysfunction (impaired relaxation).  ??2. Right ventricular systolic function is normal. The right ventricular  ?size is normal. Tricuspid regurgitation signal is inadequate for assessing  ?PA pressure.  ??3. Left atrial size was mildly dilated.  ??4. The mitral valve is grossly normal. No evidence of mitral valve  ?regurgitation.  ??5. The aortic valve is grossly normal. Aortic valve regurgitation is not  ?visualized.  ??6. The inferior vena cava IVC not well visualized.  ?  ?Neuro/Psych ?negative neurological ROS ? negative psych ROS  ? GI/Hepatic ?Neg liver ROS, odynophagia, dysphagia, abnormal barium swalow ?  ?Endo/Other  ? ? Renal/GU ?CRFRenal diseaseLab Results ?     Component                Value               Date                 ?     CREATININE               1.45 (H)            09/10/2021           ?  ? ?  ?Musculoskeletal ? ?(+) Arthritis ,  ? Abdominal ?  ?Peds ? Hematology ?Lab Results ?     Component                Value                Date                 ?     WBC                      7.4                 09/10/2021           ?     HGB                      12.3 (L)            09/10/2021           ?     HCT                      38.1 (L)            09/10/2021           ?     MCV  90.1                09/10/2021           ?     PLT                      215                 09/10/2021           ?   ?Anesthesia Other Findings ? ? Reproductive/Obstetrics ? ?  ? ? ? ? ? ? ? ? ? ? ? ? ? ?  ?  ? ? ? ? ? ? ? ? ?Anesthesia Physical ?Anesthesia Plan ? ?ASA: 4 ? ?Anesthesia Plan: MAC  ? ?Post-op Pain Management:   ? ?Induction: Intravenous ? ?PONV Risk Score and Plan: 1 and Treatment may vary due to age or medical condition and Propofol infusion ? ?Airway Management Planned: Nasal Cannula and Natural Airway ? ?Additional Equipment: None ? ?Intra-op Plan:  ? ?Post-operative Plan:  ? ?Informed Consent: I have reviewed the patients History and Physical, chart, labs and discussed the procedure including the risks, benefits and alternatives for the proposed anesthesia with the patient or authorized representative who has indicated his/her understanding and acceptance.  ? ? ? ?Dental advisory given ? ?Plan Discussed with: CRNA ? ?Anesthesia Plan Comments:   ? ? ? ? ? ? ?Anesthesia Quick Evaluation ? ?

## 2021-09-12 NOTE — TOC Progression Note (Signed)
Transition of Care (TOC) - Progression Note  ? ? ?Patient Details  ?Name: Randall Montoya ?MRN: [id number] ?Date of Birth: 09/13/1950 ? ?Transition of Care (TOC) CM/SW Contact  ?Marilu Favre, RN ?Phone Number: ?09/12/2021, 12:36 PM ? ?Clinical Narrative:    ?Was asked to arrange transport.  ?  ?Patient uses [facility] for transport and would like them to transport him home in wheel chair Lucianne Lei, he has portable oxygen , 3 in1 and wheel chair in room. Patient has assistance inside and key . ?  ?[facility] [phone] will have w/c van downstairs at 1330 , they will call nurse Joaquim Lai when they arrive . They can transport DME  ? ?Expected Discharge Plan: [place] ?Barriers to Discharge: Continued Medical Work up ? ?Expected Discharge Plan and Services ?Expected Discharge Plan: [place] ?  ?Discharge Planning Services: CM Consult ?Post Acute Care Choice: Durable Medical Equipment, Home Health ?Living arrangements for the past 2 months: [place] ?Expected Discharge Date: 09/11/21               ?DME Arranged: 3-N-1, Oxygen ?DME Agency: [employer] ?Date DME Agency Contacted: 09/09/21 ?Time DME Agency Contacted: [phone] ?Representative spoke with at DME Agency: Delana Meyer ?HH Arranged: PT, OT ?[place] Agency: [facility] ?Date HH Agency Contacted: 09/09/21 ?Time [place]: [phone] ?Representative spoke with at [place]: Alwyn Ren ? ? ?Social Determinants of Health (SDOH) Interventions ?  ? ?Readmission Risk Interventions ?   ? View : No data to display.  ?  ?  ?  ? ? ?

## 2021-09-12 NOTE — Progress Notes (Signed)
Physical Therapy Treatment ?Patient Details ?Name: Randall Montoya ?MRN: [id number] ?DOB: 12/23/1950 ?Today's Date: 09/12/2021 ? ? ?History of Present Illness Pt is a 71 y/o male admitted 09/05/21 presenting with ventral hernia; pt also endorses fall on 4/15 with LLE pain, workup negative for acute injury. S/p laparoscopic ventral hernia repair with mesh on 4/18. PMH includes arthritis, brain syndrome, cataracts, CHF, COPD, dyspnea, HLD, HTN, pre-diabetes, OSA on CPAP. ? ?  ?PT Comments  ? ? Focus of session today was functional transfers, gait, and stair training. The patient tolerated well, his o2 saturation was 84% on RA and stayed >92% on 2L [place].  Pt. Shows overall improvement with his transfer ability and stability during gait. He walked 150 feet with rollator requiring one seated rest break due to fatigue. He was also able to go up/down 3 steps min A for stability. Overall functional strength, balance, endurance, and pain are still limiting function. He reports having minimal appetite and not eating much since being in the hospital Pt. Would benefit from skilled PT to continue to address his transfer ability, gait, and balance. Plan and discharge setting remains unchanged. Pt to follow acutely as appropriate.  ?   ?Recommendations for follow up therapy are one component of a multi-disciplinary discharge planning process, led by the attending physician.  Recommendations may be updated based on patient status, additional functional criteria and insurance authorization. ? ?Follow Up Recommendations ? Home health PT ?  ?  ?Assistance Recommended at Discharge Frequent or constant Supervision/Assistance  ?Patient can return home with the following A little help with walking and/or transfers;A little help with bathing/dressing/bathroom;Assistance with cooking/housework;Assist for transportation;Help with stairs or ramp for entrance ?  ?Equipment Recommendations ? Wheelchair (measurements PT);Wheelchair cushion  (measurements PT)  ?  ?Recommendations for Other Services   ? ? ?  ?Precautions / Restrictions Precautions ?Precautions: Fall ?Precaution Comments: abdominal precautions for comfort s/p hernia repair ?Restrictions ?Weight Bearing Restrictions: No  ?  ? ?Mobility ? Bed Mobility ?Overal bed mobility: Needs Assistance ?Bed Mobility: Sit to Supine ?  ?  ?Supine to sit: Supervision ?  ?  ?General bed mobility comments: Pt on toilet upon arrival. Able to get himself from sit to supine and scoot up in bed. ?  ? ?Transfers ?Overall transfer level: Needs assistance ?Equipment used: Rolling walker (2 wheels), Rollator (4 wheels) ?Transfers: Sit to/from Stand ?Sit to Stand: Supervision ?  ?  ?  ?  ?  ?General transfer comment: Pt. able to stand from toilet w/o difficulty ?  ? ?Ambulation/Gait ?Ambulation/Gait assistance: Min guard ?Gait Distance (Feet): 150 Feet ?Assistive device: Rollator (4 wheels) ?Gait Pattern/deviations: Step-to pattern, Decreased step length - right, Decreased step length - left, Trunk flexed ?Gait velocity: decr ?  ?  ?General Gait Details: Pt uses rollator during functional mobility, requires one seated rest break. O2 sats started at 83% with no supplamental O2 and stayed >92% with 2L. ? ? ?Stairs ?Stairs: Yes ?Stairs assistance: Min assist ?Stair Management: One rail Right ?Number of Stairs: 3 ?General stair comments: One mild LOB that he was able to correct when transitioning from rollator to rail. ? ? ?Wheelchair Mobility ?  ? ?Modified Rankin (Stroke Patients Only) ?  ? ? ?  ?Balance Overall balance assessment: Needs assistance, History of Falls ?Sitting-balance support: Feet supported ?Sitting balance-Leahy Scale: Good ?Sitting balance - Comments: Able to toilet and perfrom pericare w/o difficulty ?  ?Standing balance support: Single extremity supported, During functional activity ?Standing balance-Leahy Scale: Fair ?Standing  balance comment: Good stability during gait, requires Rollator for  mobility. ?  ?  ?  ?  ?  ?  ?  ?  ?  ?  ?  ?  ? ?  ?Cognition Arousal/Alertness: Awake/alert ?Behavior During Therapy: [facility] for tasks assessed/performed ?Overall Cognitive Status: Within Functional Limits for tasks assessed ?  ?  ?  ?  ?  ?  ?  ?  ?  ?  ?  ?  ?  ?  ?  ?  ?  ?  ?  ? ?  ?Exercises   ? ?  ?General Comments   ?  ?  ? ?Pertinent Vitals/Pain Pain Assessment ?Pain Assessment: Faces ?Faces Pain Scale: Hurts a little bit ?Pain Location: Esophagus ?Pain Descriptors / Indicators: Sharp ?Pain Intervention(s): Limited activity within patient's tolerance, Monitored during session  ? ? ?Home Living   ?  ?  ?  ?  ?  ?  ?  ?  ?  ?   ?  ?Prior Function    ?  ?  ?   ? ?PT Goals (current goals can now be found in the care plan section) Acute Rehab PT Goals ?Patient Stated Goal: to go home ?PT Goal Formulation: With patient ?Time For Goal Achievement: 09/20/21 ?Potential to Achieve Goals: Fair ?Progress towards PT goals: Progressing toward goals ? ?  ?Frequency ? ? ? Min 5X/week ? ? ? ?  ?PT Plan Current plan remains appropriate  ? ? ?Co-evaluation   ?  ?  ?  ?  ? ?  ?AM-PAC PT "6 Clicks" Mobility   ?Outcome Measure ? Help needed turning from your back to your side while in a flat bed without using bedrails?: None ?Help needed moving from lying on your back to sitting on the side of a flat bed without using bedrails?: A Little ?Help needed moving to and from a bed to a chair (including a wheelchair)?: A Little ?Help needed standing up from a chair using your arms (e.g., wheelchair or bedside chair)?: A Little ?Help needed to walk in hospital room?: A Little ?Help needed climbing 3-5 steps with a railing? : A Little ?6 Click Score: 19 ? ?  ?End of Session Equipment Utilized During Treatment: Gait belt;Oxygen ?Activity Tolerance: Patient tolerated treatment well ?Patient left: in bed;with bed alarm set;with call bell/phone within reach ?Nurse Communication: Mobility status ?PT Visit Diagnosis: Other abnormalities of gait  and mobility (R26.89);Pain;Difficulty in walking, not elsewhere classified (R26.2);Muscle weakness (generalized) (M62.81) ?Pain - part of body:  (abdomen) ?  ? ? ?Time: 5093-2671 ?PT Time Calculation (min) (ACUTE ONLY): 25 min ? ?Charges:  $Gait Training: 8-22 mins ?$Therapeutic Activity: 8-22 mins          ?          ? ?Thermon Leyland, SPT ?Acute Rehab Services ? ? ? ?Thermon Leyland ?09/12/2021, 12:56 PM ? ?

## 2021-09-12 NOTE — Plan of Care (Signed)

## 2021-09-12 NOTE — Anesthesia Procedure Notes (Signed)
Procedure Name: [place] ?Date/Time: 09/12/2021 8:59 AM ?Performed by: Janace Litten, CRNA ?Pre-anesthesia Checklist: Patient identified, Emergency Drugs available, Suction available and Patient being monitored ?Patient Re-evaluated:Patient Re-evaluated prior to induction ?Oxygen Delivery Method: Nasal cannula ? ? ? ? ?

## 2021-09-12 NOTE — Transfer of Care (Signed)
Immediate Anesthesia Transfer of Care Note ? ?Patient: Randall Montoya ? ?Procedure(s) Performed: ESOPHAGOGASTRODUODENOSCOPY (EGD) WITH PROPOFOL ?BIOPSY ? ?Patient Location: PACU ? ?Anesthesia Type:MAC ? ?Level of Consciousness: drowsy, patient cooperative and responds to stimulation ? ?Airway & Oxygen Therapy: Patient Spontanous Breathing ? ?Post-op Assessment: Report given to RN and Post -op Vital signs reviewed and stable ? ?Post vital signs: Reviewed and stable ? ?Last Vitals:  ?Vitals Value Taken Time  ?BP 153/69 09/12/21 0922  ?Temp    ?Pulse 81 09/12/21 0923  ?Resp 25 09/12/21 0923  ?SpO2 96 % 09/12/21 0923  ?Vitals shown include unvalidated device data. ? ?Last Pain:  ?Vitals:  ? 09/12/21 0837  ?TempSrc: Temporal  ?PainSc: 7   ?   ? ?Patients Stated Pain Goal: 3 (09/06/21 2106) ? ?Complications: No notable events documented. ?

## 2021-09-12 NOTE — Progress Notes (Signed)
Patient transported to short stay for EGD via transport person. Removed dentures before leaving room. Dentures left in patient bathroom. Night nurse stated that she already informed short stay about consent not being filled out all the way. Consent reasoning needs to be filled out when patient arrives by MD.  ?

## 2021-09-12 NOTE — Progress Notes (Signed)
SLP Cancellation Note ? ?Patient Details ?Name: Randall Montoya ?MRN: [id number] ?DOB: [birth year] ? ? ?Cancelled treatment:       Reason Eval/Treat Not Completed: Medical issues which prohibited therapy. Pt NPO for procedure - looks like plan for EGD per chart. Results of esophagram noted. Will f/u as able and as indicated. ? ? ? ?Osie Bond., M.A. CCC-SLP ?Acute Rehabilitation Services ?Office [phone] ? ?Secure chat preferred ? ?09/12/2021, 8:54 AM ?

## 2021-09-12 NOTE — Interval H&P Note (Signed)
History and Physical Interval Note: ? ?09/12/2021 ?8:41 AM ? ?Randall Montoya  has presented today for surgery, with the diagnosis of odynophagia, dysphagia, abnormal barium swalow.  The various methods of treatment have been discussed with the patient and family. After consideration of risks, benefits and other options for treatment, the patient has consented to  Procedure(s): ?ESOPHAGOGASTRODUODENOSCOPY (EGD) WITH PROPOFOL (N/A) as a surgical intervention.  The patient's history has been reviewed, patient examined, no change in status, stable for surgery.  I have reviewed the patient's chart and labs.  Questions were answered to the patient's satisfaction.   ? ? ?Randall Montoya. [place] Plan ? ? ?

## 2021-09-12 NOTE — Progress Notes (Signed)
PT Cancellation Note ? ?Patient Details ?Name: Randall Montoya ?MRN: [id number] ?DOB: Jul 15, 1950 ? ? ?Cancelled Treatment:    Reason Eval/Treat Not Completed: Patient at procedure or test/unavailable - will check back as able. ? ?Stacie Glaze, PT DPT ?Acute Rehabilitation Services ?Pager [phone]  ?Office [phone] ? ? ? ?[name] ?09/12/2021, 9:01 AM ?

## 2021-09-12 NOTE — Anesthesia Postprocedure Evaluation (Signed)
Anesthesia Post Note ? ?Patient: Randall Montoya ? ?Procedure(s) Performed: ESOPHAGOGASTRODUODENOSCOPY (EGD) WITH PROPOFOL ?BIOPSY ? ?  ? ?Patient location during evaluation: Endoscopy ?Anesthesia Type: MAC ?Level of consciousness: awake and alert ?Pain management: pain level controlled ?Vital Signs Assessment: post-procedure vital signs reviewed and stable ?Respiratory status: spontaneous breathing, nonlabored ventilation, respiratory function stable and patient connected to nasal cannula oxygen ?Cardiovascular status: stable and blood pressure returned to baseline ?Postop Assessment: no apparent nausea or vomiting ?Anesthetic complications: no ? ? ?No notable events documented. ? ?Last Vitals:  ?Vitals:  ? 09/12/21 1016 09/12/21 1100  ?BP: (!) 163/68 (!) 158/66  ?Pulse: 76 77  ?Resp: 18 20  ?Temp: 36.7 ?C 36.6 ?C  ?SpO2: 97% 98%  ?  ?Last Pain:  ?Vitals:  ? 09/12/21 1100  ?TempSrc: Oral  ?PainSc: 6   ? ? ?  ?  ?  ?  ?  ?  ? ?Derrien Anschutz ? ? ? ? ?

## 2021-09-12 NOTE — Op Note (Signed)
[facility] ?Patient Name: Randall Montoya ?Procedure Date : 09/12/2021 ?MRN: [id number] ?Attending MD: Ladene Artist , MD ?Date of Birth: Aug 15, 1950 ?CSN: [id number] ?Age: 71 ?Admit Type: Inpatient ?Procedure:                Upper GI endoscopy ?Indications:              Dysphagia, Odynophagia, Heartburn ?Providers:                [name]. Fuller Plan, MD, Ervin Knack, RN, Jacquelin Hawking  ?                          Houle, Technician ?Referring MD:             Reather Laurence, MD ?Medicines:                Monitored Anesthesia Care ?Complications:            No immediate complications. ?Estimated Blood Loss:     Estimated blood loss was minimal. ?Procedure:                Pre-Anesthesia Assessment: ?                          - Prior to the procedure, a History and Physical  ?                          was performed, and patient medications and  ?                          allergies were reviewed. The patient's tolerance of  ?                          previous anesthesia was also reviewed. The risks  ?                          and benefits of the procedure and the sedation  ?                          options and risks were discussed with the patient.  ?                          All questions were answered, and informed consent  ?                          was obtained. Prior Anticoagulants: The patient has  ?                          taken no previous anticoagulant or antiplatelet  ?                          agents. ASA Grade Assessment: III - A patient with  ?                          severe systemic disease. After reviewing the risks  ?  and benefits, the patient was deemed in  ?                          satisfactory condition to undergo the procedure. ?                          After obtaining informed consent, the endoscope was  ?                          passed under direct vision. Throughout the  ?                          procedure, the patient's blood pressure, pulse, and  ?                           oxygen saturations were monitored continuously. The  ?                          [id number]-[id number] ([id number]) Olympus endoscope was introduced  ?                          through the mouth, and advanced to the second part  ?                          of duodenum. The upper GI endoscopy was  ?                          accomplished without difficulty. The patient  ?                          tolerated the procedure well. ?Scope In: ?Scope Out: ?Findings: ?     LA Grade D (one or more mucosal breaks involving at least 75% of  ?     esophageal circumference) esophagitis with no bleeding was found in the  ?     mid and distal esophagus. Biopsies were taken with a cold forceps for  ?     histology. ?     The exam of the esophagus was otherwise normal. ?     Diffuse mildly erythematous mucosa without bleeding was found in the  ?     gastric fundus and in the gastric body. Biopsies were taken with a cold  ?     forceps for histology. ?     The exam of the stomach was otherwise normal. ?     The duodenal bulb and second portion of the duodenum were normal. ?Impression:               - LA Grade D reflux esophagitis with no bleeding.  ?                          Biopsied. ?                          - Erythematous mucosa in the gastric fundus and  ?  gastric body. Biopsied. ?                          - Normal duodenal bulb and second portion of the  ?                          duodenum. ?Recommendation:           - Return patient to hospital ward for ongoing care. ?                          - Full liquid diet for 3-5 days, then advance as  ?                          tolerated to soft diet. ?                          - Follow antireflux measures long term. ?                          - Continue present medications including  ?                          pantoprazole 40 mg po bid, viscous lidocaine 2% 15  ?                          mL po ac & hs prn, Zofran 4 mg po q6h prn ?                          - Begin  famotidine 40 mg po hs and sucralfate  ?                          suspension 1 g po qid. ?                          - Await pathology results. ?                          - Return to GI office in 1 month. ?Procedure Code(s):        --- Professional --- ?                          [phone], Esophagogastroduodenoscopy, flexible,  ?                          transoral; with biopsy, single or multiple ?Diagnosis Code(s):        --- Professional --- ?                          K21.00, Gastro-esophageal reflux disease with  ?                          esophagitis, without bleeding ?                          K31.89, Other diseases of  stomach and duodenum ?                          R13.10, Dysphagia, unspecified ?                          R12, Heartburn ?CPT copyright 2019 American Medical Association. All rights reserved. ?The codes documented in this report are preliminary and upon coder review may  ?be revised to meet current compliance requirements. ?Ladene Artist, MD ?09/12/2021 9:46:17 AM ?This report has been signed electronically. ?Number of Addenda: 0 ?

## 2021-09-13 ENCOUNTER — Encounter (HOSPITAL_COMMUNITY): Payer: Self-pay | Admitting: Gastroenterology

## 2021-09-15 ENCOUNTER — Encounter: Payer: Self-pay | Admitting: Gastroenterology

## 2021-09-15 LAB — SURGICAL PATHOLOGY

## 2021-09-25 NOTE — Discharge Summary (Signed)
? ? ?Patient ID: ?Randall Montoya ?[id number] ?02/04/1951 71 y.o. ? ?Admit date: 09/05/2021 ?Discharge date: 09/25/2021 ? ?Admitting Diagnosis: ?VH ? ?Discharge Diagnosis ?Patient Active Problem List  ? Diagnosis Date Noted  ? Dysphagia   ? Odynophagia   ? Gastroesophageal reflux disease with esophagitis without hemorrhage   ? S/P herniorrhaphy 09/05/2021  ? Dyspnea 06/17/2021  ? OSA on CPAP 06/17/2021  ? CAD (coronary artery disease) 06/17/2021  ? Acute exacerbation of chronic obstructive pulmonary disease (COPD) ([place]) 06/17/2021  ? Heart failure ([place]) 05/05/2021  ? Respiratory distress 05/04/2021  ? Chronic diastolic CHF (congestive heart failure) ([place])   ? COPD with acute exacerbation ([place])   ? Hypertension   ? Hyperlipidemia   ? Lumbar foraminal stenosis 04/20/2021  ? Spondylosis without myelopathy or radiculopathy, cervical region 04/20/2021  ? ? ?Consultants ?none ? ?Reason for Admission: ?Lap VHR with mesh ? ?Procedures ?Lap VHR with mesh ? ?Hospital Course:  ?Dysphagia resulted in c/s to GI with esophagram and EGD. Uncomplicated post-op course.  ? ?Physical Exam: ?Gen: comfortable, no distress ?Neuro: non-focal exam ?HEENT: PERRL ?Neck: supple ?CV: RRR ?Pulm: unlabored breathing ?Abd: soft, appoprirately TTP, incisions cdi ?GU: clear yellow urine ?Extr: wwp, no edema ? ? ?Allergies as of 09/12/2021   ?No Known Allergies ?  ? ?  ?Medication List  ?  ? ?STOP taking these medications   ? ?guaiFENesin 600 MG 12 hr tablet ?Commonly known as: [place] ?  ?predniSONE 10 MG tablet ?Commonly known as: DELTASONE ?  ? ?  ? ?TAKE these medications   ? ?Acetaminophen Extra Strength 500 MG tablet ?Generic drug: acetaminophen ?Take 2 tablets (1,000 mg total) by mouth every 6 (six) hours as needed. ?  ?albuterol 108 (90 Base) MCG/ACT inhaler ?Commonly known as: VENTOLIN HFA ?Inhale 1 puff into the lungs every 6 (six) hours as needed for wheezing or shortness of breath. ?  ?albuterol (2.5 MG/3ML) 0.083% nebulizer solution ?Commonly  known as: PROVENTIL ?Take 3 mLs (2.5 mg total) by nebulization every 4 (four) hours as needed for wheezing or shortness of breath. ?  ?amLODipine 10 MG tablet ?Commonly known as: NORVASC ?Take 10 mg by mouth daily. ?  ?aspirin EC 81 MG tablet ?Take 81 mg by mouth daily. Swallow whole. ?  ?atorvastatin 80 MG tablet ?Commonly known as: LIPITOR ?Take 80 mg by mouth daily. ?  ?bumetanide 2 MG tablet ?Commonly known as: BUMEX ?Take 2 mg by mouth daily. ?  ?cholecalciferol 25 MCG (1000 UNIT) tablet ?Commonly known as: VITAMIN D3 ?Take 1,000 Units by mouth daily. ?  ?clopidogrel 75 MG tablet ?Commonly known as: PLAVIX ?Take 75 mg by mouth daily. ?  ?colchicine 0.6 MG tablet ?Take 1 tablet (0.6 mg total) by mouth daily. ?  ?docusate sodium 100 MG capsule ?Commonly known as: Colace ?Take 1 capsule (100 mg total) by mouth 2 (two) times daily. ?  ?famotidine 40 MG tablet ?Commonly known as: PEPCID ?Take 1 tablet (40 mg total) by mouth at bedtime. ?  ?gabapentin 100 MG capsule ?Commonly known as: NEURONTIN ?Take 1 capsule (100 mg total) by mouth 2 (two) times daily. ?  ?ibuprofen 800 MG tablet ?Commonly known as: ADVIL ?Take 800 mg by mouth 3 (three) times daily as needed for pain. ?What changed: Another medication with the same name was added. Make sure you understand how and when to take each. ?  ?ibuprofen 600 MG tablet ?Commonly known as: ADVIL ?Take 1 tablet (600 mg total) by mouth every 6 (six) hours  as needed. ?What changed: You were already taking a medication with the same name, and this prescription was added. Make sure you understand how and when to take each. ?  ?lisinopril 40 MG tablet ?Commonly known as: ZESTRIL ?Take 40 mg by mouth daily. ?  ?methocarbamol 750 MG tablet ?Commonly known as: Robaxin-750 ?Take 1 tablet (750 mg total) by mouth 4 (four) times daily. ?  ?metoprolol tartrate 50 MG tablet ?Commonly known as: LOPRESSOR ?Take 50 mg by mouth 2 (two) times daily. ?  ?multivitamin with minerals Tabs  tablet ?Take 1 tablet by mouth daily. ?  ?nitroGLYCERIN 0.4 MG SL tablet ?Commonly known as: NITROSTAT ?Place 0.4 mg under the tongue every 5 (five) minutes as needed for chest pain. ?  ?oxybutynin 5 MG 24 hr tablet ?Commonly known as: DITROPAN-XL ?Take 5 mg by mouth daily. ?  ?oxyCODONE 5 MG immediate release tablet ?Commonly known as: Roxicodone ?Take 1 tablet (5 mg total) by mouth every 4 (four) hours as needed for severe pain. ?  ?pantoprazole 40 MG tablet ?Commonly known as: PROTONIX ?Take 1 tablet (40 mg total) by mouth 2 (two) times daily. ?What changed: when to take this ?  ?sucralfate 1 GM/10ML suspension ?Commonly known as: CARAFATE ?Take 10 mLs (1 g total) by mouth 4 (four) times daily -  with meals and at bedtime. ?  ?tamsulosin 0.4 MG Caps capsule ?Commonly known as: FLOMAX ?Take 0.4 mg by mouth daily. ?  ?Trelegy Ellipta 100-62.5-25 MCG/ACT Aepb ?Generic drug: Fluticasone-Umeclidin-Vilant ?Inhale 1 puff into the lungs daily. ?  ? ?  ? ? ? ? Follow-up Information   ? ? [facility] Follow up.   ?Specialty: Home Health Services ?Contact information: ?[address] ?[address] ?[place] [address] ?[phone] ? ? ?  ?  ? ? Jesusita Oka, MD Follow up in 2 week(s).   ?Specialty: Surgery ?Why: For wound re-check ?Contact information: ?[address] ?[address] ?[facility] ?[place] [address] ?[phone] ? ? ?  ?  ? ?  ?  ? ?  ? ? ? ?Signed: ?Jesusita Oka, MD ?[facility] ?09/25/2021, 11:45 AM ? ?

## 2021-10-17 ENCOUNTER — Other Ambulatory Visit (HOSPITAL_COMMUNITY): Payer: Self-pay

## 2021-10-17 ENCOUNTER — Encounter: Payer: Self-pay | Admitting: Nurse Practitioner

## 2021-10-17 ENCOUNTER — Ambulatory Visit (INDEPENDENT_AMBULATORY_CARE_PROVIDER_SITE_OTHER): Payer: Medicare Other | Admitting: Nurse Practitioner

## 2021-10-17 VITALS — BP 148/70 | HR 94 | Resp 98 | Ht 69.0 in | Wt 220.0 lb

## 2021-10-17 DIAGNOSIS — Z1211 Encounter for screening for malignant neoplasm of colon: Secondary | ICD-10-CM | POA: Diagnosis not present

## 2021-10-17 DIAGNOSIS — K209 Esophagitis, unspecified without bleeding: Secondary | ICD-10-CM | POA: Diagnosis not present

## 2021-10-17 MED ORDER — PANTOPRAZOLE SODIUM 40 MG PO TBEC: 40.0000 mg | DELAYED_RELEASE_TABLET | Freq: Every day | ORAL | 3 refills | Status: AC

## 2021-10-17 NOTE — Patient Instructions (Signed)
We have sent the following medications to your pharmacy for you to pick up at your convenience:  Pantoprazole 40 mg once daily  We will contact you once we speak with Dr Fuller Plan about colonoscopy  If you are age 70 or older, your body mass index should be between 23-30. Your Body mass index is 32.49 kg/m. If this is out of the aforementioned range listed, please consider follow up with your Primary Care Provider.  If you are age 55 or younger, your body mass index should be between 19-25. Your Body mass index is 32.49 kg/m. If this is out of the aformentioned range listed, please consider follow up with your Primary Care Provider.   ________________________________________________________  The [facility] providers would like to encourage you to use [facility] to communicate with providers for non-urgent requests or questions.  Due to long hold times on the telephone, sending your provider a message by [facility] may be a faster and more efficient way to get a response.  Please allow 48 business hours for a response.  Please remember that this is for non-urgent requests.  _______________________________________________________   I appreciate the  opportunity to care for you  Thank You   [place]

## 2021-10-17 NOTE — Progress Notes (Signed)
Assessment   Patient Profile:  Randall Montoya is a 71 y.o. male known to Dr. Fuller Plan (recent hospitalization) with a past medical history of COPD on 02, HLD, hypertension, obesity, CKD, prediabetes, sleep apnea, CAD status post remote CABG and remote stent placement on Plavix, steatosis, diverticulosis,  Additional medical history as listed in [place] .  Severe esophagitis.   Found on EGD done for dysphagia / odynophagia. Etiology unclear but maybe reflux related. Viral stains negative. He completed course of carafate, BID and Pepcid. Symptoms have improved.    Colon cancer screening.  No prior colonoscopy / Cologuard. No alarm symptoms, no family history of colon cancer   CAD, status post remote CABG and remote stent placement.  On Plavix.   COPD, 02 dependent   Plan   Pantoprazole 40 mg daily Sleeping on wedge pillow. Encouraged him to go to be on an empty stomach.  Patient is at increased risk for procedures though he has never had a screening colonoscopy. Could consider Cologuard  Does EGD need to be repeated to document healing? Will discuss with Dr Fuller Plan.    HPI   Chief Complaint : Hospital follow-up  71 year old male seen in the hospital late April 2023 for evaluation of odynophagia/dysphagia /hiccups , epigastric discomfort. Inpatient EGD remarkable for LA grade D reflux esophagitis  Interval History :  Cold water is a little uncomfortable for him to swallow but otherwise feels okay. He completed the course of carafate. He isn't taking taking Pepcid or protonix though both are on his home med list. He says he was taking both but ran out of pills last week. No heartburn.   Patient has never had a colonoscopy. He has no blood in his stool. He has no [facility] of colon cancer.   Previous GI Evaluation   09/11/21 Barium swallow IMPRESSION: Examination limited by clinical status and pain   1.  Symptomatic mild distal esophageal stasis and retropulsion.   2. Transient delay in  passage of barium tablet near GE junction without stricture.   09/12/2021 EGD Open dictation box LA grade D reflux esophagitis with no bleeding, gastritis FINAL MICROSCOPIC DIAGNOSIS:   A. STOMACH, ANTRAL, BIOPSY:  -  Reactive gastropathy  -  No intestinal metaplasia identified  -  See comment   B. ESOPHAGUS, BIOPSY:  -  Acute esophagitis with ulceration  -  No malignancy identified  -  See comment   COMMENT:   A.  H. pylori immunohistochemistry is NEGATIVE for microorganisms.   B.  CMV, HSV-1 and HSV-2 immunohistochemistry is NEGATIVE. PAS stain is pending and will be reported in an addendum.  The differential diagnosis  mainly includes medication and infection.   Imaging     Labs:     Latest Ref Rng & Units 09/10/2021    6:47 AM 09/09/2021    1:03 AM 09/08/2021   11:18 AM  CBC  WBC 4.0 - 10.5 K/uL 7.4   9.2   12.4    Hemoglobin 13.0 - 17.0 g/dL 12.3   12.9   14.5    Hematocrit 39.0 - 52.0 % 38.1   39.2   44.2    Platelets 150 - 400 K/uL 215   241   251         Latest Ref Rng & Units 06/16/2021    6:29 PM 05/04/2021    1:55 PM  Hepatic Function  Total Protein 6.5 - 8.1 g/dL 7.2   7.2    Albumin 3.5 -  5.0 g/dL 4.4   4.4    AST 15 - 41 U/L 19   22    ALT 0 - 44 U/L 28   34    Alk Phosphatase 38 - 126 U/L 95   87    Total Bilirubin 0.3 - 1.2 mg/dL 0.7   0.6       Past Medical History:  Diagnosis Date   Arthritis    Brain syndrome    Cataract    Congestive heart failure (CHF) (HCC)    COPD (chronic obstructive pulmonary disease) (HCC)    Dyspnea    Hyperlipidemia    Hypertension    Kidney failure    Prediabetes    Sleep apnea     Past Surgical History:  Procedure Laterality Date   BIOPSY  09/12/2021   Procedure: BIOPSY;  Surgeon: Ladene Artist, MD;  Location: [facility];  Service: Gastroenterology;;   ESOPHAGOGASTRODUODENOSCOPY (EGD) WITH PROPOFOL N/A 09/12/2021   Procedure: ESOPHAGOGASTRODUODENOSCOPY (EGD) WITH PROPOFOL;  Surgeon: Ladene Artist, MD;  Location: [facility];  Service: Gastroenterology;  Laterality: N/A;   heart stent     INSERTION OF MESH N/A 09/05/2021   Procedure: INSERTION OF MESH;  Surgeon: Jesusita Oka, MD;  Location: [facility];  Service: General;  Laterality: N/A;   myocardial infarction     VENTRAL HERNIA REPAIR N/A 09/05/2021   Procedure: LAPAROSCOPIC VENTRAL HERNIA REPAIR;  Surgeon: Jesusita Oka, MD;  Location: [facility];  Service: General;  Laterality: N/A;    Current Medications, Allergies, Family History and Social History were reviewed in [facility] record.     Current Outpatient Medications  Medication Sig Dispense Refill   acetaminophen (TYLENOL) 500 MG tablet Take 2 tablets (1,000 mg total) by mouth every 6 (six) hours as needed. 120 tablet 5   albuterol (PROVENTIL) (2.5 MG/3ML) 0.083% nebulizer solution Take 3 mLs (2.5 mg total) by nebulization every 4 (four) hours as needed for wheezing or shortness of breath. 75 mL 2   albuterol (VENTOLIN HFA) 108 (90 Base) MCG/ACT inhaler Inhale 1 puff into the lungs every 6 (six) hours as needed for wheezing or shortness of breath.     amLODipine (NORVASC) 10 MG tablet Take 10 mg by mouth daily.     aspirin EC 81 MG tablet Take 81 mg by mouth daily. Swallow whole.     atorvastatin (LIPITOR) 80 MG tablet Take 80 mg by mouth daily.     bumetanide (BUMEX) 2 MG tablet Take 2 mg by mouth daily.     cholecalciferol (VITAMIN D3) 25 MCG (1000 UNIT) tablet Take 1,000 Units by mouth daily.     clopidogrel (PLAVIX) 75 MG tablet Take 75 mg by mouth daily.     colchicine 0.6 MG tablet Take 1 tablet (0.6 mg total) by mouth daily. 30 tablet 1   docusate sodium (COLACE) 100 MG capsule Take 1 capsule (100 mg total) by mouth 2 (two) times daily. 60 capsule 2   famotidine (PEPCID) 40 MG tablet Take 1 tablet (40 mg total) by mouth at bedtime. 90 tablet 1   gabapentin (NEURONTIN) 100 MG capsule Take 1 capsule (100 mg total) by mouth 2 (two) times daily. 60  capsule 1   ibuprofen (ADVIL) 600 MG tablet Take 1 tablet (600 mg total) by mouth every 6 (six) hours as needed. 120 tablet 1   ibuprofen (ADVIL) 800 MG tablet Take 800 mg by mouth 3 (three) times daily as needed for pain.  lisinopril (ZESTRIL) 40 MG tablet Take 40 mg by mouth daily.     methocarbamol (ROBAXIN-750) 750 MG tablet Take 1 tablet (750 mg total) by mouth 4 (four) times daily. 120 tablet 1   metoprolol tartrate (LOPRESSOR) 50 MG tablet Take 50 mg by mouth 2 (two) times daily.     Multiple Vitamin (MULTIVITAMIN WITH MINERALS) TABS tablet Take 1 tablet by mouth daily.     nitroGLYCERIN (NITROSTAT) 0.4 MG SL tablet Place 0.4 mg under the tongue every 5 (five) minutes as needed for chest pain.     oxybutynin (DITROPAN-XL) 5 MG 24 hr tablet Take 5 mg by mouth daily.     oxyCODONE (ROXICODONE) 5 MG immediate release tablet Take 1 tablet (5 mg total) by mouth every 4 (four) hours as needed for severe pain. 20 tablet 0   pantoprazole (PROTONIX) 40 MG tablet Take 1 tablet (40 mg total) by mouth 2 (two) times daily. 60 tablet 3   sucralfate (CARAFATE) 1 GM/10ML suspension Take 10 mLs (1 g total) by mouth 4 (four) times daily -  with meals and at bedtime. 420 mL 0   tamsulosin (FLOMAX) 0.4 MG CAPS capsule Take 0.4 mg by mouth daily.     TRELEGY ELLIPTA 100-62.5-25 MCG/ACT AEPB Inhale 1 puff into the lungs daily.     No current facility-administered medications for this visit.    Review of Systems: No chest pain. No shortness of breath. No urinary complaints.    Physical Exam  Wt Readings from Last 3 Encounters:  09/05/21 235 lb (106.6 kg)  08/29/21 246 lb 14.4 oz (112 kg)  06/20/21 (P) 230 lb 13.2 oz (104.7 kg)    BP (!) 148/70   Pulse 94   Resp (!) 98   Ht 5' 9" (1.753 m)   Wt 220 lb (99.8 kg)   BMI 32.49 kg/m  Constitutional:  Pleasant male in no acute distress. Psychiatric: Pleasant. Normal mood and affect. Behavior is normal. EENT: Pupils normal.  Conjunctivae are  normal. No scleral icterus. Neck supple.  Cardiovascular: Normal rate, regular rhythm. No edema Pulmonary/chest: Effort normal and breath sounds normal. No wheezing, rales or rhonchi. Abdominal: Soft, protuberant,  nontender. Bowel sounds active throughout. There are no masses palpable.  Neurological: Alert and oriented to person place and time. Skin: Skin is warm and dry. No rashes noted.  Tye Savoy, NP  10/17/2021, 8:33 AM

## 2021-10-18 NOTE — Progress Notes (Signed)
Recommend follow up EGD after at least 3 months of PPI therapy and antireflux measures. Recommend colonoscopy for CRC screening at the time of his EGD. If he declines colonoscopy then offer Cologuard.

## 2021-10-19 ENCOUNTER — Telehealth: Payer: Self-pay

## 2021-10-19 NOTE — Telephone Encounter (Signed)
Spoke with pt and gave pt recommendations. Pt agreed to EGD and Colonoscopy in 3 months at hospital. 3 month recall placed.

## 2021-10-19 NOTE — Telephone Encounter (Signed)
-----   Message from Willia Craze, NP sent at 10/18/2021  9:50 AM EDT ----- Mickel Baas, I saw this patient in clinic yesterday. I sent Fuller Plan a message about whether he needed a repeat EGD and also regarding colon cancer screening. Please see his remarks below.   "Please schedule a follow up EGD after at least 3 months PPI therapy and antireflux measures to reassess esophagitis, exclude underlying Barrett's. If he agrees please schedule colonoscopy for CRC screening at the time of his follow up EGD. Hospital endoscopy is required d/t his O2 needs.  If he declines colonoscopy then please offer him Cologuard as a screening option."   Will you please let patient know what Dr. Fuller Plan had to say and then arrange for above studies to be done at Hospital in 3 months. Thanks

## 2021-11-28 ENCOUNTER — Encounter: Payer: Self-pay | Admitting: Orthopaedic Surgery

## 2021-11-28 ENCOUNTER — Ambulatory Visit (INDEPENDENT_AMBULATORY_CARE_PROVIDER_SITE_OTHER): Payer: Medicare Other | Admitting: Orthopaedic Surgery

## 2021-11-28 VITALS — BP 113/45 | HR 67 | Ht 69.0 in | Wt 220.0 lb

## 2021-11-28 DIAGNOSIS — M47812 Spondylosis without myelopathy or radiculopathy, cervical region: Secondary | ICD-10-CM

## 2021-11-28 NOTE — Progress Notes (Signed)
Office Visit Note   Patient: Randall Montoya           Date of Birth: Jun 20, 1950           MRN: [id number] Visit Date: 11/28/2021              Requested by: Mardi Mainland, [address],  [place] [address] PCP: Mardi Mainland, FNP   Assessment & Plan: Visit Diagnoses:  1. Spondylosis without myelopathy or radiculopathy, cervical region     Plan: Reviewed his MRI scan plain radiographs discussed two-level surgery with his posterior longitudinal ligament calcification.  Risks of dural tear was discussed in detail.  Has multiple medical problems including heart failure COPD chronic oxygen sleep apnea, coronary disease.  At this point nonoperative treatment would be recommended unless he develops myelopathic symptoms which were not present.  He will continue to use his walker and fall prevention discussed.  Hopefully with some continued gradual weight loss he will get some improvement in lung function and possibly improvement in heart function.  He can follow-up with me on an as-needed basis.  Follow-Up Instructions: Return if symptoms worsen or fail to improve.   Orders:  No orders of the defined types were placed in this encounter.  No orders of the defined types were placed in this encounter.     Procedures: No procedures performed   Clinical Data: No additional findings.   Subjective: Chief Complaint  Patient presents with   Neck - Pain, Follow-up    HPI 71 year old male returns to states the injection in his lumbar only lasted for about 15 minutes.  He continues to have discomfort in his neck with popping and MRI scan shows some mild to moderate canal stenosis at C5-6 and C6-7 with ossification of posterior longitudinal ligament.  This ossification is more poorly visualized on plain radiographs.  Patient denies upper extremity radicular symptoms.  He states his neck still pops with range of motion.  Patient is on chronic oxygen and additional  problems with readmissions for diastolic heart failure, COPD.  Past smoker for many years quit more than 5 years ago.  He states he was in the hospital for hernia and spent more than a week in the hospital and ended up losing some weight he still trying to work on losing some weight to help his heart and breathing.  Patient's had 1 fall since last seen.  Review of Systems all the systems noncontributory to HPI.   Objective: Vital Signs: BP (!) 113/45   Pulse 67   Ht '5\' 9"'$  (1.753 m)   Wt 220 lb (99.8 kg)   BMI 32.49 kg/m   Physical Exam Constitutional:      Appearance: He is obese.  HENT:     Head: Atraumatic.     Right Ear: External ear normal.     Left Ear: External ear normal.     Nose: Nose normal.  Eyes:     Extraocular Movements: Extraocular movements intact.  Pulmonary:     Comments: On portable oxygen. Abdominal:     Palpations: Abdomen is soft.  Skin:    Capillary Refill: Capillary refill takes less than 2 seconds.  Neurological:     Mental Status: He is alert.  Psychiatric:        Mood and Affect: Mood normal.        Behavior: Behavior normal.     Ortho Exam upper extremity reflexes are intact.  Patient has greater than  50% rotation of his neck complains of some popping and cracking.  Patient is amatory with his rolling walker.  Specialty Comments:  No specialty comments available.  Imaging: No results found.   PMFS History: Patient Active Problem List   Diagnosis Date Noted   Dysphagia    Odynophagia    Gastroesophageal reflux disease with esophagitis without hemorrhage    S/P herniorrhaphy 09/05/2021   Dyspnea 06/17/2021   OSA on CPAP 06/17/2021   CAD (coronary artery disease) 06/17/2021   Acute exacerbation of chronic obstructive pulmonary disease (COPD) ([place]) 06/17/2021   Heart failure ([place]) 05/05/2021   Respiratory distress 05/04/2021   Chronic diastolic CHF (congestive heart failure) ([facility])    COPD with acute exacerbation ([facility])     Hypertension    Hyperlipidemia    Lumbar foraminal stenosis 04/20/2021   Spondylosis without myelopathy or radiculopathy, cervical region 04/20/2021   Past Medical History:  Diagnosis Date   Arthritis    Brain syndrome    Cataract    Congestive heart failure (CHF) (HCC)    COPD (chronic obstructive pulmonary disease) (H[facility])    Dyspnea    Hyperlipidemia    Hypertension    Kidney failure    Prediabetes    Sleep apnea     Family History  Problem Relation Age of Onset   Heart disease Mother    Lung cancer Father    Lung cancer Brother     Past Surgical History:  Procedure Laterality Date   BIOPSY  09/12/2021   Procedure: BIOPSY;  Surgeon: Ladene Artist, MD;  Location: [facility] ENDOSCOPY;  Service: Gastroenterology;;   ESOPHAGOGASTRODUODENOSCOPY (EGD) WITH PROPOFOL N/A 09/12/2021   Procedure: ESOPHAGOGASTRODUODENOSCOPY (EGD) WITH PROPOFOL;  Surgeon: Ladene Artist, MD;  Location: [facility] ENDOSCOPY;  Service: Gastroenterology;  Laterality: N/A;   heart stent     INSERTION OF MESH N/A 09/05/2021   Procedure: INSERTION OF MESH;  Surgeon: Jesusita Oka, MD;  Location: [facility];  Service: General;  Laterality: N/A;   myocardial infarction     VENTRAL HERNIA REPAIR N/A 09/05/2021   Procedure: LAPAROSCOPIC VENTRAL HERNIA REPAIR;  Surgeon: Jesusita Oka, MD;  Location: [facility] OR;  Service: General;  Laterality: N/A;   Social History   Occupational History   Not on file  Tobacco Use   Smoking status: Former    Years: 50.00    Types: Cigarettes   Smokeless tobacco: Never  Vaping Use   Vaping Use: Never used  Substance and Sexual Activity   Alcohol use: Not Currently   Drug use: Never   Sexual activity: Not on file

## 2021-12-11 ENCOUNTER — Emergency Department (HOSPITAL_COMMUNITY)
Admission: EM | Admit: 2021-12-11 | Discharge: 2021-12-11 | Disposition: A | Discharge status: Home / Self Care | Payer: Medicare Other | Attending: Emergency Medicine | Admitting: Emergency Medicine

## 2021-12-11 ENCOUNTER — Emergency Department (HOSPITAL_COMMUNITY): Payer: Medicare Other

## 2021-12-11 ENCOUNTER — Encounter (HOSPITAL_COMMUNITY): Payer: Self-pay

## 2021-12-11 DIAGNOSIS — I700 Atherosclerosis of aorta: Secondary | ICD-10-CM | POA: Insufficient documentation

## 2021-12-11 DIAGNOSIS — S8012XA Contusion of left lower leg, initial encounter: Secondary | ICD-10-CM | POA: Diagnosis not present

## 2021-12-11 DIAGNOSIS — I2510 Atherosclerotic heart disease of native coronary artery without angina pectoris: Secondary | ICD-10-CM | POA: Insufficient documentation

## 2021-12-11 DIAGNOSIS — J449 Chronic obstructive pulmonary disease, unspecified: Secondary | ICD-10-CM | POA: Insufficient documentation

## 2021-12-11 DIAGNOSIS — Z7982 Long term (current) use of aspirin: Secondary | ICD-10-CM | POA: Insufficient documentation

## 2021-12-11 DIAGNOSIS — S8992XA Unspecified injury of left lower leg, initial encounter: Secondary | ICD-10-CM | POA: Diagnosis present

## 2021-12-11 DIAGNOSIS — M1712 Unilateral primary osteoarthritis, left knee: Secondary | ICD-10-CM | POA: Diagnosis not present

## 2021-12-11 DIAGNOSIS — I6782 Cerebral ischemia: Secondary | ICD-10-CM | POA: Diagnosis not present

## 2021-12-11 DIAGNOSIS — W08XXXA Fall from other furniture, initial encounter: Secondary | ICD-10-CM | POA: Insufficient documentation

## 2021-12-11 DIAGNOSIS — W19XXXA Unspecified fall, initial encounter: Secondary | ICD-10-CM

## 2021-12-11 MED ORDER — ACETAMINOPHEN 325 MG PO TABS
650.0000 mg | ORAL_TABLET | Freq: Once | ORAL | Status: AC
  Administered 2021-12-11: 650 mg via ORAL
  Filled 2021-12-11: qty 2

## 2021-12-11 NOTE — ED Notes (Signed)
Report received from [place], Therapist, sports. Patient resting in bed. NAD noted. Call light in reach. Patient waiting on [facility] for transport back home.
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
## 2021-12-11 NOTE — ED Notes (Signed)
Discharge instructions reviewed with patient, patient verbalized understanding. Patient discharged to recovery home with [facility].
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
## 2021-12-11 NOTE — Discharge Instructions (Addendum)
Randall Montoya hadYou were seen in the emergency department after your fall.  CT scans performed that showed no broken bones or bleeding in your brain.  You do have a hematoma on your left shin which is essentially a large bruise/blood collection.  Randall Montoya can ice this and apply an Ace wrap to help with the swelling and keep your leg elevated.  Randall Montoya will likely feel sore over the next few days before Randall Montoya start to feel better.  Randall Montoya can take Tylenol as needed for pain up to every 6 hours and occasional Motrin as needed.  Randall Montoya should follow-up with your primary doctor to have your symptoms rechecked.  Randall Montoya should return to the emergency department if Randall Montoya have significantly worsening headache, repetitive vomiting, numbness or weakness in your arms or legs, confusion, or if Randall Montoya have any other new or concerning symptoms.

## 2021-12-11 NOTE — ED Notes (Signed)
Got patient undressed into a gown on the monitor did ekg patient is resting with call bell in reach

## 2021-12-11 NOTE — ED Notes (Signed)
[facility] called at 14:35; patient is 10th in line.

## 2021-12-29 ENCOUNTER — Telehealth: Payer: Self-pay

## 2021-12-29 ENCOUNTER — Other Ambulatory Visit: Payer: Self-pay

## 2021-12-29 DIAGNOSIS — Z1211 Encounter for screening for malignant neoplasm of colon: Secondary | ICD-10-CM

## 2021-12-29 DIAGNOSIS — K209 Esophagitis, unspecified without bleeding: Secondary | ICD-10-CM

## 2021-12-29 NOTE — Telephone Encounter (Signed)
Patient has been scheduled for endo colon at [facility] with Dr. Fuller Plan on 01/25/22 at 10:30 am. He did confirm that he has been on PPI since last OV. Hospital orders placed. He is not diabetic, but however on a blood thinner. Clearance letter sent to PCP. PV scheduled for 01/11/22 at 1:30 pm. Pt instructed on when/where to go. Address was provided for both hospital & PV location given that he relies on transportation. Pt verbalized all understanding.

## 2022-01-04 ENCOUNTER — Other Ambulatory Visit: Payer: Self-pay | Admitting: Family Medicine

## 2022-01-05 ENCOUNTER — Telehealth: Payer: Self-pay

## 2022-01-05 NOTE — Telephone Encounter (Signed)
Received call from PCP office that clearance letter has been received. Per Kennyth Lose, she will have provider review & fax back.

## 2022-01-09 ENCOUNTER — Telehealth: Payer: Self-pay

## 2022-01-09 NOTE — Telephone Encounter (Signed)
Spoke with Birdie Hopes from PCP office & she stated the previous clearance letter that was said to be received on 01/05/22 had not been scanned into patient's chart. New copy has been faxed to her directly at [phone].

## 2022-01-11 ENCOUNTER — Telehealth: Payer: Self-pay

## 2022-01-11 ENCOUNTER — Ambulatory Visit (AMBULATORY_SURGERY_CENTER): Payer: Medicare Other

## 2022-01-11 VITALS — Ht 69.0 in | Wt 234.0 lb

## 2022-01-11 DIAGNOSIS — Z1211 Encounter for screening for malignant neoplasm of colon: Secondary | ICD-10-CM

## 2022-01-11 MED ORDER — NA SULFATE-K SULFATE-MG SULF 17.5-3.13-1.6 GM/177ML PO SOLN: 1.0000 | ORAL | 0 refills | Status: DC

## 2022-01-11 NOTE — Progress Notes (Signed)
No egg or soy allergy known to patient  No issues known to pt with past sedation with any surgeries or procedures Patient denies ever being told they had issues or difficulty with intubation  No FH of Malignant Hyperthermia Pt is not on diet pills Pt is not on  home 02  Pt is on Plavix. Pt denies issues with constipation  No A fib or A flutter Have any cardiac testing pending--denied Pt instructed to use Single[employer] or GoodRx for a price reduction on prep   Pt is aware we continue to await clearance letter from prescribing MD to hold Plavix.  We will notify him when clearance is received.

## 2022-01-11 NOTE — Telephone Encounter (Signed)
PV today for EGD/Colon at [facility] 01/25/22.  We have not yet received clearance to hold Plavix. I informed the patient we would call with instructions. His instruction sheet states to stop Plavix 8/31, but he knows to call before then if he has not heard from [place] (to ensure prescribing MD approves).

## 2022-01-15 ENCOUNTER — Telehealth: Payer: Self-pay

## 2022-01-15 NOTE — Telephone Encounter (Signed)
Spoke with PCP office & they stated clearance was faxed on 01/12/22. She is going to fax another copy.

## 2022-01-15 NOTE — Telephone Encounter (Signed)
Received clearance letter from PCP stating "pt needs to be off plavix for 5 days before procedure. So he needs to stop by 01/20/22." Spoke with patient & verbalized all understanding.

## 2022-01-16 NOTE — Telephone Encounter (Signed)
Patient stated he is on aspirin. Will route clearance letter to PCP to see if it needs to be held as well.

## 2022-01-17 ENCOUNTER — Encounter (HOSPITAL_COMMUNITY): Payer: Self-pay | Admitting: Gastroenterology

## 2022-01-17 NOTE — Telephone Encounter (Signed)
Spoke with PCP office in regards to fax that was sent yesterday for aspirin clearance & front desk is going to have nurse call me back.

## 2022-01-18 NOTE — Telephone Encounter (Signed)
Spoke with Mathews Robinsons (Nykedra's nurse) at PCP office & received verbal clearance to hold both plavix and aspirin for 5 days prior to procedure. Fax has been sent to [phone] for a written copy to be filled out. Patient made aware & verbalized all understanding.

## 2022-01-24 ENCOUNTER — Other Ambulatory Visit: Payer: Self-pay | Admitting: Family Medicine

## 2022-01-24 DIAGNOSIS — M7989 Other specified soft tissue disorders: Secondary | ICD-10-CM

## 2022-01-24 DIAGNOSIS — Z7902 Long term (current) use of antithrombotics/antiplatelets: Secondary | ICD-10-CM

## 2022-01-25 ENCOUNTER — Encounter (HOSPITAL_COMMUNITY)
Admission: RE | Disposition: A | Discharge status: Home / Self Care | Payer: Self-pay | Source: Ambulatory Visit | Attending: Gastroenterology

## 2022-01-25 ENCOUNTER — Ambulatory Visit (HOSPITAL_COMMUNITY): Payer: Medicare Other | Admitting: Certified Registered"

## 2022-01-25 ENCOUNTER — Other Ambulatory Visit: Payer: Self-pay

## 2022-01-25 ENCOUNTER — Ambulatory Visit (HOSPITAL_BASED_OUTPATIENT_CLINIC_OR_DEPARTMENT_OTHER): Payer: Medicare Other | Admitting: Certified Registered"

## 2022-01-25 ENCOUNTER — Ambulatory Visit (HOSPITAL_COMMUNITY)
Admission: RE | Admit: 2022-01-25 | Discharge: 2022-01-25 | Disposition: A | Discharge status: Home / Self Care | Payer: Medicare Other | Source: Ambulatory Visit | Attending: Gastroenterology | Admitting: Gastroenterology

## 2022-01-25 ENCOUNTER — Encounter (HOSPITAL_COMMUNITY): Payer: Self-pay | Admitting: Gastroenterology

## 2022-01-25 DIAGNOSIS — K635 Polyp of colon: Secondary | ICD-10-CM | POA: Diagnosis not present

## 2022-01-25 DIAGNOSIS — Z955 Presence of coronary angioplasty implant and graft: Secondary | ICD-10-CM | POA: Diagnosis not present

## 2022-01-25 DIAGNOSIS — K209 Esophagitis, unspecified without bleeding: Secondary | ICD-10-CM

## 2022-01-25 DIAGNOSIS — K3189 Other diseases of stomach and duodenum: Secondary | ICD-10-CM

## 2022-01-25 DIAGNOSIS — D120 Benign neoplasm of cecum: Secondary | ICD-10-CM | POA: Diagnosis not present

## 2022-01-25 DIAGNOSIS — I509 Heart failure, unspecified: Secondary | ICD-10-CM | POA: Insufficient documentation

## 2022-01-25 DIAGNOSIS — G4730 Sleep apnea, unspecified: Secondary | ICD-10-CM | POA: Insufficient documentation

## 2022-01-25 DIAGNOSIS — Z1211 Encounter for screening for malignant neoplasm of colon: Secondary | ICD-10-CM | POA: Diagnosis not present

## 2022-01-25 DIAGNOSIS — I2510 Atherosclerotic heart disease of native coronary artery without angina pectoris: Secondary | ICD-10-CM | POA: Insufficient documentation

## 2022-01-25 DIAGNOSIS — Z87891 Personal history of nicotine dependence: Secondary | ICD-10-CM | POA: Insufficient documentation

## 2022-01-25 DIAGNOSIS — K5730 Diverticulosis of large intestine without perforation or abscess without bleeding: Secondary | ICD-10-CM

## 2022-01-25 DIAGNOSIS — I110 Hypertensive heart disease with heart failure: Secondary | ICD-10-CM | POA: Insufficient documentation

## 2022-01-25 DIAGNOSIS — K2100 Gastro-esophageal reflux disease with esophagitis, without bleeding: Secondary | ICD-10-CM | POA: Diagnosis not present

## 2022-01-25 DIAGNOSIS — D122 Benign neoplasm of ascending colon: Secondary | ICD-10-CM

## 2022-01-25 DIAGNOSIS — Z09 Encounter for follow-up examination after completed treatment for conditions other than malignant neoplasm: Secondary | ICD-10-CM | POA: Diagnosis not present

## 2022-01-25 DIAGNOSIS — K640 First degree hemorrhoids: Secondary | ICD-10-CM

## 2022-01-25 DIAGNOSIS — J449 Chronic obstructive pulmonary disease, unspecified: Secondary | ICD-10-CM

## 2022-01-25 HISTORY — PX: POLYPECTOMY: SHX5525

## 2022-01-25 HISTORY — PX: BIOPSY: SHX5522

## 2022-01-25 HISTORY — PX: ESOPHAGOGASTRODUODENOSCOPY (EGD) WITH PROPOFOL: SHX5813

## 2022-01-25 HISTORY — PX: COLONOSCOPY WITH PROPOFOL: SHX5780

## 2022-01-25 SURGERY — COLONOSCOPY WITH PROPOFOL
Anesthesia: Monitor Anesthesia Care

## 2022-01-25 MED ORDER — PROPOFOL 10 MG/ML IV BOLUS
INTRAVENOUS | Status: DC | PRN
  Administered 2022-01-25: 10 mg via INTRAVENOUS
  Administered 2022-01-25: 20 mg via INTRAVENOUS
  Administered 2022-01-25: 10 mg via INTRAVENOUS

## 2022-01-25 MED ORDER — SODIUM CHLORIDE 0.9 % IV SOLN: INTRAVENOUS | Status: DC

## 2022-01-25 MED ORDER — PROPOFOL 1000 MG/100ML IV EMUL
INTRAVENOUS | Status: AC
  Filled 2022-01-25: qty 100

## 2022-01-25 MED ORDER — PROPOFOL 500 MG/50ML IV EMUL
INTRAVENOUS | Status: DC | PRN
  Administered 2022-01-25: 125 ug/kg/min via INTRAVENOUS

## 2022-01-25 SURGICAL SUPPLY — 25 items

## 2022-01-25 NOTE — H&P (Signed)
History & Physical  Primary Care Physician:  Mardi Mainland, FNP Primary Gastroenterologist: Lucio Edward, MD  CHIEF COMPLAINT:  CRC screening, follow-up of severe esophagitis  HPI: Randall Montoya is a 71 y.o. male for CRC screening, average risk, with colonoscopy and a history of severe esophagitis for follow-up EGD.   Past Medical History:  Diagnosis Date   Arthritis    Blood transfusion without reported diagnosis    trauma 1975   Brain syndrome    Cataract    Congestive heart failure (CHF) (HCC)    COPD (chronic obstructive pulmonary disease) (HCC)    Dyspnea    GERD (gastroesophageal reflux disease)    Hyperlipidemia    Hypertension    Kidney failure    Myocardial infarction [facility])    Oxygen deficiency    Prediabetes    Sleep apnea     Past Surgical History:  Procedure Laterality Date   BIOPSY  09/12/2021   Procedure: BIOPSY;  Surgeon: Ladene Artist, MD;  Location: [place];  Service: Gastroenterology;;   ESOPHAGOGASTRODUODENOSCOPY (EGD) WITH PROPOFOL N/A 09/12/2021   Procedure: ESOPHAGOGASTRODUODENOSCOPY (EGD) WITH PROPOFOL;  Surgeon: Ladene Artist, MD;  Location: [place];  Service: Gastroenterology;  Laterality: N/A;   heart stent     INSERTION OF MESH N/A 09/05/2021   Procedure: INSERTION OF MESH;  Surgeon: Jesusita Oka, MD;  Location: [place];  Service: General;  Laterality: N/A;   myocardial infarction     VENTRAL HERNIA REPAIR N/A 09/05/2021   Procedure: LAPAROSCOPIC VENTRAL HERNIA REPAIR;  Surgeon: Jesusita Oka, MD;  Location: [place];  Service: General;  Laterality: N/A;    Prior to Admission medications   Medication Sig Start Date End Date Taking? Authorizing Provider  albuterol (PROVENTIL) (2.5 MG/3ML) 0.083% nebulizer solution Take 3 mLs (2.5 mg total) by nebulization every 4 (four) hours as needed for wheezing or shortness of breath. 06/20/21 06/20/22 Yes Pokhrel, Laxman, MD  albuterol (VENTOLIN HFA) 108 (90 Base) MCG/ACT inhaler  Inhale 1 puff into the lungs every 6 (six) hours as needed for wheezing or shortness of breath. 03/22/21  Yes [provider]  amLODipine (NORVASC) 10 MG tablet Take 10 mg by mouth daily. 03/31/21  Yes [provider]  aspirin EC 81 MG tablet Take 81 mg by mouth daily. Swallow whole.   Yes [provider]  atorvastatin (LIPITOR) 80 MG tablet Take 80 mg by mouth daily. 04/30/21  Yes [provider]  atorvastatin (LIPITOR) 80 MG tablet Take 1 tablet by mouth daily. 12/07/21  Yes [provider]  bisoprolol-hydrochlorothiazide (ZIAC) 10-6.25 MG tablet Take 1 tablet by mouth daily. 11/27/21  Yes [provider]  bumetanide (BUMEX) 2 MG tablet Take 2 mg by mouth daily. 08/21/21  Yes [provider]  cholecalciferol (VITAMIN D3) 25 MCG (1000 UNIT) tablet Take 1,000 Units by mouth daily.   Yes [provider]  colchicine 0.6 MG tablet Take 1 tablet (0.6 mg total) by mouth daily. 06/20/21  Yes Pokhrel, Laxman, MD  gabapentin (NEURONTIN) 100 MG capsule Take 1 capsule (100 mg total) by mouth 2 (two) times daily. 05/06/21  Yes Regalado, Belkys A, MD  lisinopril (ZESTRIL) 40 MG tablet Take 40 mg by mouth daily. 06/25/21  Yes [provider]  methocarbamol (ROBAXIN-750) 750 MG tablet Take 1 tablet (750 mg total) by mouth 4 (four) times daily. 09/11/21  Yes Lovick, Montel Culver, MD  Na Sulfate-K Sulfate-Mg Sulf 17.5-3.13-1.6 GM/177ML SOLN Take 1 kit by mouth  as directed. May use generic Suprep, no prior authorization. Take as directed. 01/11/22  Yes Ladene Artist, MD  oxybutynin (DITROPAN-XL) 5 MG 24 hr tablet Take 5 mg by mouth daily. 04/30/21  Yes [provider]  pantoprazole (PROTONIX) 40 MG tablet Take 40 mg by mouth daily.   Yes [provider]  pantoprazole (PROTONIX) 40 MG tablet Take 1 tablet (40 mg total) by mouth daily. 10/17/21  Yes Willia Craze, NP  tamsulosin (FLOMAX) 0.4 MG CAPS capsule Take 0.4 mg by mouth  daily. 04/30/21  Yes [provider]  TRELEGY ELLIPTA 100-62.5-25 MCG/ACT AEPB Inhale 1 puff into the lungs daily. 04/11/21  Yes [provider]  acetaminophen (TYLENOL) 500 MG tablet Take 2 tablets (1,000 mg total) by mouth every 6 (six) hours as needed. Patient not taking: Reported on 01/11/2022 09/11/21 09/11/22  Jesusita Oka, MD  clopidogrel (PLAVIX) 75 MG tablet Take 75 mg by mouth daily. 04/30/21   [provider]  clopidogrel (PLAVIX) 75 MG tablet Take 1 tablet by mouth daily. 01/01/22   [provider]  ibuprofen (ADVIL) 600 MG tablet Take 1 tablet (600 mg total) by mouth every 6 (six) hours as needed. 09/11/21   Jesusita Oka, MD  nitroGLYCERIN (NITROLINGUAL) 0.4 MG/SPRAY spray PLEASE SEE ATTACHED FOR DETAILED DIRECTIONS 01/01/22   [provider]    Current Facility-Administered Medications  Medication Dose Route Frequency Provider Last Rate Last Admin   0.9 %  sodium chloride infusion   Intravenous Continuous Ladene Artist, MD 20 mL/hr at 01/25/22 0928 New Bag at 01/25/22 0928    Allergies as of 12/29/2021   (No Known Allergies)    Family History  Problem Relation Age of Onset   Heart disease Mother    Lung cancer Father    Lung cancer Brother    Colon cancer Neg Hx    Colon polyps Neg Hx    Esophageal cancer Neg Hx    Stomach cancer Neg Hx    Rectal cancer Neg Hx     Social History   Socioeconomic History   Marital status: Divorced    Spouse name: Not on file   Number of children: Not on file   Years of education: Not on file   Highest education level: Not on file  Occupational History   Not on file  Tobacco Use   Smoking status: Former    Years: 50.00    Types: Cigarettes   Smokeless tobacco: Never  Vaping Use   Vaping Use: Never used  Substance and Sexual Activity   Alcohol use: Not Currently   Drug use: Never   Sexual activity: Not on file  Other Topics Concern   Not on file  Social History Narrative    Not on file   Social Determinants of Health   Financial Resource Strain: Not on file  Food Insecurity: Not on file  Transportation Needs: Not on file  Physical Activity: Not on file  Stress: Not on file  Social Connections: Not on file  Intimate Partner Violence: Not on file    Review of Systems:  All systems reviewed were negative except where noted in HPI.   Physical Exam: Vital signs in last 24 hours: Temp:  [97.7 F (36.5 C)] 97.7 F (36.5 C) (09/07 0923) Pulse Rate:  [52] 52 (09/07 0923) Resp:  [9] 9 (09/07 0923) BP: (149)/(65) 149/65 (09/07 0923) SpO2:  [100 %] 100 % (09/07 0923) Weight:  [102.1 kg] 102.1 kg (09/07  0923)   General:  Alert, well-developed, in NAD Head:  Normocephalic and atraumatic. Eyes:  Sclera clear, no icterus.   Conjunctiva pink. Ears:  Normal auditory acuity. Mouth:  No deformity or lesions.  Neck:  Supple; no masses . Lungs:  Clear throughout to auscultation.   No wheezes, crackles, or rhonchi. No acute distress. Heart:  Regular rate and rhythm; no murmurs. Abdomen:  Soft, nondistended, nontender. No masses, hepatomegaly. No obvious masses.  Normal bowel .    Rectal:  Deferred   Msk:  Symmetrical without gross deformities.. Pulses:  Normal pulses noted. Extremities:  Without edema. Neurologic:  Alert and  oriented x4;  grossly normal neurologically. Skin:  Intact without significant lesions or rashes. Cervical Nodes:  No significant cervical adenopathy. Psych:  Alert and cooperative. Normal mood and affect.  Impression / Plan:   CRC screening, average risk, for colonoscopy and a history of severe esophagitis for follow-up EGD.  [name]. Fuller Plan  01/25/2022, 10:03 AM See Shea Evans, [facility], to contact our on call provider

## 2022-01-25 NOTE — Anesthesia Procedure Notes (Signed)
Procedure Name: MAC Date/Time: 01/25/2022 10:08 AM  Performed by: Niel Hummer, CRNAPre-anesthesia Checklist: Patient identified, Emergency Drugs available, Suction available and Patient being monitored Oxygen Delivery Method: Simple face mask

## 2022-01-25 NOTE — Anesthesia Postprocedure Evaluation (Signed)
Anesthesia Post Note  Patient: Randall Montoya  Procedure(s) Performed: COLONOSCOPY WITH PROPOFOL ESOPHAGOGASTRODUODENOSCOPY (EGD) WITH PROPOFOL POLYPECTOMY BIOPSY     Patient location during evaluation: PACU Anesthesia Type: MAC Level of consciousness: awake and alert Pain management: pain level controlled Vital Signs Assessment: post-procedure vital signs reviewed and stable Respiratory status: spontaneous breathing, nonlabored ventilation, respiratory function stable and patient connected to nasal cannula oxygen Cardiovascular status: stable and blood pressure returned to baseline Postop Assessment: no apparent nausea or vomiting Anesthetic complications: no   No notable events documented.  Last Vitals:  Vitals:   01/25/22 1103 01/25/22 1113  BP: (!) 149/67 (!) 136/51  Pulse: (!) 49 (!) 47  Resp: 18 14  Temp:    SpO2: 98% 98%    Last Pain:  Vitals:   01/25/22 1113  TempSrc:   PainSc: 0-No pain                 Agapito Hanway

## 2022-01-25 NOTE — Anesthesia Preprocedure Evaluation (Addendum)
Anesthesia Evaluation  Patient identified by MRN, date of birth, ID band Patient awake    Reviewed: Allergy & Precautions, NPO status , Patient's Chart, lab work & pertinent test results  History of Anesthesia Complications Negative for: history of anesthetic complications  Airway Mallampati: II  TM Distance: >3 FB Neck ROM: Full    Dental  (+) Edentulous Upper, Edentulous Lower, Dental Advisory Given   Pulmonary shortness of breath, sleep apnea and Continuous Positive Airway Pressure Ventilation , COPD,  COPD inhaler, former smoker,     + decreased breath sounds      Cardiovascular hypertension, Pt. on medications + CAD, + Cardiac Stents and +CHF   Rhythm:Regular  ECHO 12/22 1. Left ventricular ejection fraction, by estimation, is 60 to 65%. The  left ventricle has normal function. The left ventricle has no regional  wall motion abnormalities. Left ventricular diastolic parameters are  consistent with Grade I diastolic  dysfunction (impaired relaxation).  2. Right ventricular systolic function is normal. The right ventricular  size is normal. Tricuspid regurgitation signal is inadequate for assessing  PA pressure.  3. Left atrial size was mildly dilated.  4. The mitral valve is grossly normal. No evidence of mitral valve  regurgitation.  5. The aortic valve is grossly normal. Aortic valve regurgitation is not  visualized.  6. The inferior vena cava IVC not well visualized.    Neuro/Psych negative neurological ROS  negative psych ROS   GI/Hepatic Neg liver ROS, GERD  Medicated,odynophagia, dysphagia, abnormal barium swalow   Endo/Other    Renal/GU CRFRenal diseaseLab Results      Component                Value               Date                      CREATININE               1.45 (H)            09/10/2021                Musculoskeletal  (+) Arthritis ,   Abdominal   Peds  Hematology   Anesthesia Other  Findings   Reproductive/Obstetrics                           Anesthesia Physical  Anesthesia Plan  ASA: 4  Anesthesia Plan: MAC   Post-op Pain Management: Minimal or no pain anticipated   Induction: Intravenous  PONV Risk Score and Plan: 1 and Treatment may vary due to age or medical condition and Propofol infusion  Airway Management Planned: Natural Airway and Simple Face Mask  Additional Equipment: None  Intra-op Plan:   Post-operative Plan:   Informed Consent: I have reviewed the patients History and Physical, chart, labs and discussed the procedure including the risks, benefits and alternatives for the proposed anesthesia with the patient or authorized representative who has indicated his/her understanding and acceptance.     Dental advisory given  Plan Discussed with: CRNA and Anesthesiologist  Anesthesia Plan Comments:         Anesthesia Quick Evaluation

## 2022-01-25 NOTE — Discharge Instructions (Signed)
YOU HAD AN ENDOSCOPIC PROCEDURE TODAY: Refer to the procedure report and other information in the discharge instructions given to you for any specific questions about what was found during the examination. If this information does not answer your questions, please call [place] office at [phone] to clarify.  ° °YOU SHOULD EXPECT: Some feelings of bloating in the abdomen. Passage of more gas than usual. Walking can help get rid of the air that was put into your GI tract during the procedure and reduce the bloating. If you had a lower endoscopy (such as a colonoscopy or flexible sigmoidoscopy) you may notice spotting of blood in your stool or on the toilet paper. Some abdominal soreness may be present for a day or two, also. ° °DIET: Your first meal following the procedure should be a light meal and then it is ok to progress to your normal diet. A half-sandwich or bowl of soup is an example of a good first meal. Heavy or fried foods are harder to digest and may make you feel nauseous or bloated. Drink plenty of fluids but you should avoid alcoholic beverages for 24 hours. If you had a esophageal dilation, please see attached instructions for diet.   ° °ACTIVITY: Your care partner should take you home directly after the procedure. You should plan to take it easy, moving slowly for the rest of the day. You can resume normal activity the day after the procedure however YOU SHOULD NOT DRIVE, use power tools, machinery or perform tasks that involve climbing or major physical exertion for 24 hours (because of the sedation medicines used during the test).  ° °SYMPTOMS TO REPORT IMMEDIATELY: °A gastroenterologist can be reached at any hour. Please call [phone]  for any of the following symptoms:  °Following lower endoscopy (colonoscopy, flexible sigmoidoscopy) °Excessive amounts of blood in the stool  °Significant tenderness, worsening of abdominal pains  °Swelling of the abdomen that is new, acute  °Fever of 100° or  higher  °Following upper endoscopy (EGD, EUS, ERCP, esophageal dilation) °Vomiting of blood or coffee ground material  °New, significant abdominal pain  °New, significant chest pain or pain under the shoulder blades  °Painful or persistently difficult swallowing  °New shortness of breath  °Black, tarry-looking or red, bloody stools ° °FOLLOW UP:  °If any biopsies were taken you will be contacted by phone or by letter within the next 1-3 weeks. Call [phone]  if you have not heard about the biopsies in 3 weeks.  °Please also call with any specific questions about appointments or follow up tests. ° °

## 2022-01-25 NOTE — Transfer of Care (Signed)
Immediate Anesthesia Transfer of Care Note  Patient: Randall Montoya  Procedure(s) Performed: COLONOSCOPY WITH PROPOFOL ESOPHAGOGASTRODUODENOSCOPY (EGD) WITH PROPOFOL POLYPECTOMY BIOPSY  Patient Location: PACU  Anesthesia Type:MAC  Level of Consciousness: drowsy  Airway & Oxygen Therapy: Patient Spontanous Breathing and Patient connected to face mask oxygen  Post-op Assessment: Report given to RN, Post -op Vital signs reviewed and stable and Patient moving all extremities X 4  Post vital signs: Reviewed and stable  Last Vitals:  Vitals Value Taken Time  BP 90/48   Temp    Pulse 56   Resp 12   SpO2 99     Last Pain:  Vitals:   01/25/22 0923  TempSrc: Temporal  PainSc: 0-No pain         Complications: No notable events documented.

## 2022-01-25 NOTE — Op Note (Signed)
[facility] Patient Name: Randall Montoya Procedure Date: 01/25/2022 MRN: [id number] Attending MD: Ladene Artist , MD Date of Birth: 04-16-1951 CSN: [id number] Age: 71 Admit Type: Outpatient Procedure:                Colonoscopy Indications:              Screening for colorectal malignant neoplasm Providers:                [name]. Fuller Plan, MD, Carlyn Reichert, RN, William Dalton, Technician Referring MD:             Willey Blade, FNP Medicines:                Monitored Anesthesia Care Complications:            No immediate complications. Estimated blood loss:                            None. Estimated Blood Loss:     Estimated blood loss: none. Procedure:                Pre-Anesthesia Assessment:                           - Prior to the procedure, a History and Physical                            was performed, and patient medications and                            allergies were reviewed. The patient's tolerance of                            previous anesthesia was also reviewed. The risks                            and benefits of the procedure and the sedation                            options and risks were discussed with the patient.                            All questions were answered, and informed consent                            was obtained. Prior Anticoagulants: The patient has                            taken Plavix (clopidogrel), last dose was 6 days                            prior to procedure. ASA Grade Assessment: III - A  patient with severe systemic disease. After                            reviewing the risks and benefits, the patient was                            deemed in satisfactory condition to undergo the                            procedure.                           After obtaining informed consent, the colonoscope                            was passed under direct vision. Throughout  the                            procedure, the patient's blood pressure, pulse, and                            oxygen saturations were monitored continuously. The                            [id number]-HQ190L ([id number]) Olympus colonoscope was                            introduced through the anus and advanced to the the                            cecum, identified by appendiceal orifice and                            ileocecal valve. The ileocecal valve, appendiceal                            orifice, and rectum were photographed. The quality                            of the bowel preparation was good. The colonoscopy                            was performed without difficulty. The patient                            tolerated the procedure well. Scope In: 10:13:04 AM Scope Out: 10:34:16 AM Scope Withdrawal Time: 0 hours 17 minutes 4 seconds  Total Procedure Duration: 0 hours 21 minutes 12 seconds  Findings:      The perianal and digital rectal examinations were normal.      A 6 mm polyp was found in the cecum. The polyp was sessile. The polyp       was removed with a cold snare. Resection and retrieval were complete.      A 4 mm polyp was found in the ascending colon. The polyp  was sessile.       The polyp was removed with a cold biopsy forceps. Resection and       retrieval were complete.      Multiple medium-mouthed diverticula were found in the left colon. There       was evidence of diverticular spasm.      Internal hemorrhoids were found during retroflexion. The hemorrhoids       were small and Grade I (internal hemorrhoids that do not prolapse).      The exam was otherwise without abnormality on direct and retroflexion       views. Impression:               - One 6 mm polyp in the cecum, removed with a cold                            snare. Resected and retrieved.                           - One 4 mm polyp in the ascending colon, removed                            with a cold biopsy  forceps. Resected and retrieved.                           - Mild diverticulosis in the left colon.                           - Internal hemorrhoids.                           - The examination was otherwise normal on direct                            and retroflexion views. Moderate Sedation:      Not Applicable - Patient had care per Anesthesia. Recommendation:           - Repeat colonoscopy after studies are complete for                            surveillance based on pathology results.                           - Patient has a contact number available for                            emergencies. The signs and symptoms of potential                            delayed complications were discussed with the                            patient. Return to normal activities tomorrow.                            Written discharge instructions were provided to the  patient.                           - High fiber diet.                           - Continue present medications.                           - Await pathology results.                           - Resume Plavix (clopidogrel) at prior dose in 2                            days. Refer to managing physician for further                            adjustment of therapy. Procedure Code(s):        --- Professional ---                           [phone], Colonoscopy, flexible; with removal of                            tumor(s), polyp(s), or other lesion(s) by snare                            technique                           45380, 60, Colonoscopy, flexible; with biopsy,                            single or multiple Diagnosis Code(s):        --- Professional ---                           Z12.11, Encounter for screening for malignant                            neoplasm of colon                           K63.5, Polyp of colon                           K64.0, First degree hemorrhoids                           K57.30,  Diverticulosis of large intestine without                            perforation or abscess without bleeding CPT copyright 2019 [employer]. All rights reserved. The codes documented in this report are preliminary and upon coder review may  be revised to meet current compliance requirements. Ladene Artist, MD 01/25/2022 10:50:31 AM This report has been signed electronically.  Number of Addenda: 0 

## 2022-01-27 ENCOUNTER — Encounter (HOSPITAL_COMMUNITY): Payer: Self-pay | Admitting: Gastroenterology

## 2022-01-27 NOTE — Op Note (Signed)
[facility] Patient Name: Randall Montoya Procedure Date: 01/25/2022 MRN: [id number] Attending MD: Ladene Artist , MD Date of Birth: Jan 17, 1951 CSN: [id number] Age: 71 Admit Type: Outpatient Procedure:                Upper GI endoscopy Indications:              Follow-up of reflux esophagitis Providers:                [name]. Fuller Plan, MD, Carlyn Reichert, RN, William Dalton, Technician Referring MD:             Joya Salm, FNP Medicines:                Monitored Anesthesia Care Complications:            No immediate complications. Estimated Blood Loss:     Estimated blood loss: none. Procedure:                Pre-Anesthesia Assessment:                           - Prior to the procedure, a History and Physical                            was performed, and patient medications and                            allergies were reviewed. The patient's tolerance of                            previous anesthesia was also reviewed. The risks                            and benefits of the procedure and the sedation                            options and risks were discussed with the patient.                            All questions were answered, and informed consent                            was obtained. Prior Anticoagulants: The patient has                            taken Plavix (clopidogrel), last dose was 6 days                            prior to procedure. ASA Grade Assessment: III - A                            patient with severe systemic disease. After  reviewing the risks and benefits, the patient was                            deemed in satisfactory condition to undergo the                            procedure.                           After obtaining informed consent, the endoscope was                            passed under direct vision. Throughout the                            procedure, the patient's blood  pressure, pulse, and                            oxygen saturations were monitored continuously. The                            [id number]-H190 ([id number]) Olympus endoscope was introduced                            through the mouth, and advanced to the second part                            of duodenum. The upper GI endoscopy was                            accomplished without difficulty. The patient                            tolerated the procedure well. Scope In: Scope Out: Findings:      The esophagus was normal.      A single 6 mm submucosal firm papule (nodule) with no bleeding and no       stigmata of recent bleeding was found on the posterior wall of the       gastric antrum. Biopsies were taken with a cold forceps for histology.      The exam of the stomach was otherwise normal.      The examined duodenum was normal.      The cardia and gastric fundus were normal on retroflexion. Impression:               - Normal esophagus.                           - A single submucosal papule (nodule) found in the                            stomach. Biopsied.                           - Normal examined duodenum. Moderate Sedation:      Not Applicable - Patient had care per  Anesthesia. Recommendation:           - Patient has a contact number available for                            emergencies. The signs and symptoms of potential                            delayed complications were discussed with the                            patient. Return to normal activities tomorrow.                            Written discharge instructions were provided to the                            patient.                           - Resume previous diet.                           - Follow antireflux measures.                           - Continue present medications.                           - Await pathology results.                           - Consider EUS to further evaluate the antral                             submucosal nodule.                           - Resume Plavix (clopidogrel) at prior dose in 2                            days. Refer to managing physician for further                            adjustment of therapy. Procedure Code(s):        --- Professional ---                           [phone], Esophagogastroduodenoscopy, flexible,                            transoral; with biopsy, single or multiple Diagnosis Code(s):        --- Professional ---                           K31.89, Other diseases of stomach and duodenum  K21.00, Gastro-esophageal reflux disease with                            esophagitis, without bleeding CPT copyright 2019 [employer] Association. All rights reserved. The codes documented in this report are preliminary and upon coder review may  be revised to meet current compliance requirements. Ladene Artist, MD 01/27/2022 8:09:39 PM Number of Addenda: 0

## 2022-01-29 LAB — SURGICAL PATHOLOGY

## 2022-01-30 ENCOUNTER — Ambulatory Visit
Admission: RE | Admit: 2022-01-30 | Discharge: 2022-01-30 | Disposition: A | Discharge status: Home / Self Care | Payer: Medicare Other | Source: Ambulatory Visit | Attending: Family Medicine | Admitting: Family Medicine

## 2022-01-30 DIAGNOSIS — M79662 Pain in left lower leg: Secondary | ICD-10-CM

## 2022-01-30 DIAGNOSIS — Z7902 Long term (current) use of antithrombotics/antiplatelets: Secondary | ICD-10-CM

## 2022-02-07 ENCOUNTER — Encounter: Payer: Self-pay | Admitting: Gastroenterology

## 2022-02-09 ENCOUNTER — Telehealth: Payer: Self-pay

## 2022-02-09 NOTE — Telephone Encounter (Signed)
-----   Message from Ladene Artist, MD sent at 02/09/2022 10:12 AM EDT ----- Joline Salt can coordinate with Patty to schedule. Thank you. MS  ----- Message ----- From: Irving Copas., MD Sent: 02/09/2022   4:52 AM EDT To: Ladene Artist, MD  MS, Seems reasonable to pursue EUS nonurgent. If patient is agreeable, then we can work on scheduling just reply back and will forward to [place]. Thanks. GM ----- Message ----- From: Ladene Artist, MD Sent: 02/07/2022  10:45 AM EDT To: Irving Copas., MD  Gabe, Please review EGD findings for possible further evaluation with EUS. EGD on 01/27/22 showed a firm 6 mm antral submucosal nodule. Mucosal biopsies showed hyperemia, otherwise normal. EGD 09/12/21 showed the same in review of the photos however neglected to mention this in the text of the report.  Thanks,  Norberto Sorenson

## 2022-02-12 ENCOUNTER — Other Ambulatory Visit: Payer: Self-pay

## 2022-02-12 ENCOUNTER — Telehealth: Payer: Self-pay

## 2022-02-12 DIAGNOSIS — K3189 Other diseases of stomach and duodenum: Secondary | ICD-10-CM

## 2022-02-12 NOTE — Telephone Encounter (Signed)
Discussed scheduling with Chong Sicilian, RN. Patient is currently on wait list & will be contacted when date becomes available.

## 2022-02-12 NOTE — Telephone Encounter (Signed)
-----   Message from Ladene Artist, MD sent at 02/09/2022 10:12 AM EDT ----- Joline Salt can coordinate with Zeph Riebel to schedule. Thank you. MS  ----- Message ----- From: Irving Copas., MD Sent: 02/09/2022   4:52 AM EDT To: Ladene Artist, MD  MS, Seems reasonable to pursue EUS nonurgent. If patient is agreeable, then we can work on scheduling just reply back and will forward to [place]. Thanks. GM ----- Message ----- From: Ladene Artist, MD Sent: 02/07/2022  10:45 AM EDT To: Irving Copas., MD  Gabe, Please review EGD findings for possible further evaluation with EUS. EGD on 01/27/22 showed a firm 6 mm antral submucosal nodule. Mucosal biopsies showed hyperemia, otherwise normal. EGD 09/12/21 showed the same in review of the photos however neglected to mention this in the text of the report.  Thanks,  Norberto Sorenson

## 2022-02-12 NOTE — Telephone Encounter (Signed)
EUS scheduled for 10/26 at 145 at [facility] with GM   Left message on machine to call back

## 2022-02-13 NOTE — Telephone Encounter (Signed)
EUS scheduled, pt instructed and medications reviewed.  Patient instructions mailed to home.  Patient to call with any questions or concerns.   Per August 2023 note the pt can hold plavix 5 days prior to the procedure.

## 2022-03-08 ENCOUNTER — Encounter (HOSPITAL_COMMUNITY): Payer: Self-pay | Admitting: Gastroenterology

## 2022-03-09 ENCOUNTER — Encounter (HOSPITAL_COMMUNITY): Payer: Self-pay | Admitting: Gastroenterology

## 2022-03-09 NOTE — Progress Notes (Signed)
Delene Loll Bowel Prep reminder: n/a npo after midnight  For Anesthesia: PCP - Willey Blade Cardiologist -[place]  Chest x-ray -  ct 12/11/21 xray 06/16/21 EKG - 12/11/21 Stress Test -  n/a ECHO - 05/05/2021 Cardiac Cath -  n/a Pacemaker/ICD device last checked: n/a Sleep Study - n/a CPAP -  Uses o2    Blood Thinner Instructions: Plavix 5 day hold Instructions:   Last Dose:      Anesthesia review:Hx of CHF, HTN, CABG, MI, COPD, Kidney failure, on 2L 02 continuous. Last seen by cards on 12/25/21 per pt no new instructions will f/u in 6 months.

## 2022-03-15 ENCOUNTER — Encounter (HOSPITAL_COMMUNITY)
Admission: RE | Disposition: A | Discharge status: Home / Self Care | Payer: Self-pay | Source: Ambulatory Visit | Attending: Gastroenterology

## 2022-03-15 ENCOUNTER — Ambulatory Visit (HOSPITAL_COMMUNITY): Payer: Medicare Other | Admitting: Physician Assistant

## 2022-03-15 ENCOUNTER — Other Ambulatory Visit: Payer: Self-pay

## 2022-03-15 ENCOUNTER — Ambulatory Visit (HOSPITAL_COMMUNITY)
Admission: RE | Admit: 2022-03-15 | Discharge: 2022-03-15 | Disposition: A | Discharge status: Home / Self Care | Payer: Medicare Other | Source: Ambulatory Visit | Attending: Gastroenterology | Admitting: Gastroenterology

## 2022-03-15 ENCOUNTER — Encounter (HOSPITAL_COMMUNITY): Payer: Self-pay | Admitting: Gastroenterology

## 2022-03-15 DIAGNOSIS — Z955 Presence of coronary angioplasty implant and graft: Secondary | ICD-10-CM | POA: Diagnosis not present

## 2022-03-15 DIAGNOSIS — J449 Chronic obstructive pulmonary disease, unspecified: Secondary | ICD-10-CM | POA: Diagnosis not present

## 2022-03-15 DIAGNOSIS — I110 Hypertensive heart disease with heart failure: Secondary | ICD-10-CM | POA: Insufficient documentation

## 2022-03-15 DIAGNOSIS — I509 Heart failure, unspecified: Secondary | ICD-10-CM | POA: Diagnosis not present

## 2022-03-15 DIAGNOSIS — K3189 Other diseases of stomach and duodenum: Secondary | ICD-10-CM

## 2022-03-15 DIAGNOSIS — I2510 Atherosclerotic heart disease of native coronary artery without angina pectoris: Secondary | ICD-10-CM | POA: Insufficient documentation

## 2022-03-15 DIAGNOSIS — K219 Gastro-esophageal reflux disease without esophagitis: Secondary | ICD-10-CM | POA: Diagnosis not present

## 2022-03-15 DIAGNOSIS — G4730 Sleep apnea, unspecified: Secondary | ICD-10-CM | POA: Diagnosis not present

## 2022-03-15 DIAGNOSIS — K2289 Other specified disease of esophagus: Secondary | ICD-10-CM | POA: Diagnosis present

## 2022-03-15 DIAGNOSIS — Z87891 Personal history of nicotine dependence: Secondary | ICD-10-CM | POA: Insufficient documentation

## 2022-03-15 HISTORY — PX: ESOPHAGOGASTRODUODENOSCOPY: SHX5428

## 2022-03-15 HISTORY — PX: EUS: SHX5427

## 2022-03-15 HISTORY — PX: BIOPSY: SHX5522

## 2022-03-15 SURGERY — UPPER ENDOSCOPIC ULTRASOUND (EUS) RADIAL
Anesthesia: Monitor Anesthesia Care

## 2022-03-15 MED ORDER — PROPOFOL 10 MG/ML IV BOLUS
INTRAVENOUS | Status: DC | PRN
  Administered 2022-03-15 (×2): 20 mg via INTRAVENOUS

## 2022-03-15 MED ORDER — PROPOFOL 500 MG/50ML IV EMUL
INTRAVENOUS | Status: DC | PRN
  Administered 2022-03-15: 125 ug/kg/min via INTRAVENOUS

## 2022-03-15 MED ORDER — ONDANSETRON HCL 4 MG/2ML IJ SOLN
INTRAMUSCULAR | Status: DC | PRN
  Administered 2022-03-15: 4 mg via INTRAVENOUS

## 2022-03-15 MED ORDER — PHENYLEPHRINE 80 MCG/ML (10ML) SYRINGE FOR IV PUSH (FOR BLOOD PRESSURE SUPPORT)
PREFILLED_SYRINGE | INTRAVENOUS | Status: DC | PRN
  Administered 2022-03-15: 120 ug via INTRAVENOUS
  Administered 2022-03-15: 80 ug via INTRAVENOUS
  Administered 2022-03-15 (×2): 120 ug via INTRAVENOUS
  Administered 2022-03-15: 80 ug via INTRAVENOUS

## 2022-03-15 MED ORDER — SODIUM CHLORIDE 0.9 % IV SOLN: INTRAVENOUS | Status: DC

## 2022-03-15 MED ORDER — LACTATED RINGERS IV SOLN: INTRAVENOUS | Status: DC

## 2022-03-15 MED ORDER — DEXMEDETOMIDINE HCL IN NACL 80 MCG/20ML IV SOLN
INTRAVENOUS | Status: AC
  Filled 2022-03-15: qty 20

## 2022-03-15 MED ORDER — PROPOFOL 10 MG/ML IV BOLUS
INTRAVENOUS | Status: AC
  Filled 2022-03-15: qty 20

## 2022-03-15 MED ORDER — LIDOCAINE 2% (20 MG/ML) 5 ML SYRINGE
INTRAMUSCULAR | Status: DC | PRN
  Administered 2022-03-15: 100 mg via INTRAVENOUS

## 2022-03-15 NOTE — H&P (Signed)
GASTROENTEROLOGY PROCEDURE H&P NOTE   Primary Care Physician: Mardi Mainland, FNP  HPI: Randall Montoya is a 71 y.o. male who presents for EGD/EUS to evaluate gastric antral SEL.  Past Medical History:  Diagnosis Date   Arthritis    Blood transfusion without reported diagnosis    trauma 1975   Brain syndrome    Cataract    Congestive heart failure (CHF) (HCC)    COPD (chronic obstructive pulmonary disease) (HCC)    Dyspnea    GERD (gastroesophageal reflux disease)    Hyperlipidemia    Hypertension    Kidney failure    Myocardial infarction [facility])    Oxygen deficiency    Prediabetes    Sleep apnea    Past Surgical History:  Procedure Laterality Date   BIOPSY  09/12/2021   Procedure: BIOPSY;  Surgeon: Ladene Artist, MD;  Location: [place];  Service: Gastroenterology;;   BIOPSY  01/25/2022   Procedure: BIOPSY;  Surgeon: Ladene Artist, MD;  Location: [facility];  Service: Gastroenterology;;   COLONOSCOPY WITH PROPOFOL N/A 01/25/2022   Procedure: COLONOSCOPY WITH PROPOFOL;  Surgeon: Ladene Artist, MD;  Location: [facility];  Service: Gastroenterology;  Laterality: N/A;   ESOPHAGOGASTRODUODENOSCOPY (EGD) WITH PROPOFOL N/A 09/12/2021   Procedure: ESOPHAGOGASTRODUODENOSCOPY (EGD) WITH PROPOFOL;  Surgeon: Ladene Artist, MD;  Location: [place];  Service: Gastroenterology;  Laterality: N/A;   ESOPHAGOGASTRODUODENOSCOPY (EGD) WITH PROPOFOL N/A 01/25/2022   Procedure: ESOPHAGOGASTRODUODENOSCOPY (EGD) WITH PROPOFOL;  Surgeon: Ladene Artist, MD;  Location: [facility];  Service: Gastroenterology;  Laterality: N/A;   heart stent     INSERTION OF MESH N/A 09/05/2021   Procedure: INSERTION OF MESH;  Surgeon: Jesusita Oka, MD;  Location: [place] [facility];  Service: General;  Laterality: N/A;   myocardial infarction     POLYPECTOMY  01/25/2022   Procedure: POLYPECTOMY;  Surgeon: Ladene Artist, MD;  Location: [facility];  Service: Gastroenterology;;   VENTRAL HERNIA  REPAIR N/A 09/05/2021   Procedure: LAPAROSCOPIC VENTRAL HERNIA REPAIR;  Surgeon: Jesusita Oka, MD;  Location: [place];  Service: General;  Laterality: N/A;   Current Facility-Administered Medications  Medication Dose Route Frequency Provider Last Rate Last Admin   0.9 %  sodium chloride infusion   Intravenous Continuous Mansouraty, Telford Nab., MD       lactated ringers infusion   Intravenous Continuous Mansouraty, Telford Nab., MD 10 mL/hr at 03/15/22 1304 New Bag at 03/15/22 1304    Current Facility-Administered Medications:    0.9 %  sodium chloride infusion, , Intravenous, Continuous, Mansouraty, Telford Nab., MD   lactated ringers infusion, , Intravenous, Continuous, Mansouraty, Telford Nab., MD, Last Rate: 10 mL/hr at 03/15/22 1304, New Bag at 03/15/22 1304 No Known Allergies Family History  Problem Relation Age of Onset   Heart disease Mother    Lung cancer Father    Lung cancer Brother    Colon cancer Neg Hx    Colon polyps Neg Hx    Esophageal cancer Neg Hx    Stomach cancer Neg Hx    Rectal cancer Neg Hx    Social History   Socioeconomic History   Marital status: Divorced    Spouse name: Not on file   Number of children: Not on file   Years of education: Not on file   Highest education level: Not on file  Occupational History   Not on file  Tobacco Use   Smoking status: Former    Years: 50.00  Types: Cigarettes   Smokeless tobacco: Never  Vaping Use   Vaping Use: Never used  Substance and Sexual Activity   Alcohol use: Not Currently   Drug use: Never   Sexual activity: Not on file  Other Topics Concern   Not on file  Social History Narrative   Not on file   Social Determinants of Health   Financial Resource Strain: Not on file  Food Insecurity: Not on file  Transportation Needs: Not on file  Physical Activity: Not on file  Stress: Not on file  Social Connections: Not on file  Intimate Partner Violence: Not on file    Physical Exam: Today's  Vitals   03/09/22 1049 03/15/22 1249  BP:  (!) 160/70  Pulse:  (!) 54  Resp:  18  Temp:  97.9 F (36.6 C)  TempSrc:  Oral  SpO2:  100%  Weight: 108.4 kg 108.4 kg  Height:  '5\' 8"'$  (1.727 m)  PainSc:  0-No pain   Body mass index is 36.34 kg/m. GEN: NAD EYE: Sclerae anicteric ENT: MMM CV: Non-tachycardic GI: Soft, NT/ND NEURO:  Alert & Oriented x 3  Lab Results: No results for input(s): "WBC", "HGB", "HCT", "PLT" in the last 72 hours. BMET No results for input(s): "NA", "K", "CL", "CO2", "GLUCOSE", "BUN", "CREATININE", "CALCIUM" in the last 72 hours. LFT No results for input(s): "PROT", "ALBUMIN", "AST", "ALT", "ALKPHOS", "BILITOT", "BILIDIR", "IBILI" in the last 72 hours. PT/INR No results for input(s): "LABPROT", "INR" in the last 72 hours.   Impression / Plan: This is a 71 y.o.male who presents for EGD/EUS to evaluate gastric antral SEL.  The risks and benefits of endoscopic evaluation/treatment were discussed with the patient and/or family; these include but are not limited to the risk of perforation, infection, bleeding, missed lesions, lack of diagnosis, severe illness requiring hospitalization, as well as anesthesia and sedation related illnesses.  The patient's history has been reviewed, patient examined, no change in status, and deemed stable for procedure.  The patient and/or family is agreeable to proceed.    Justice Britain, MD [facility] Office # [phone]

## 2022-03-15 NOTE — Anesthesia Procedure Notes (Signed)
Procedure Name: MAC Date/Time: 03/15/2022 1:38 PM  Performed by: West Pugh, CRNAPre-anesthesia Checklist: Patient identified, Emergency Drugs available, Suction available, Patient being monitored and Timeout performed Patient Re-evaluated:Patient Re-evaluated prior to induction Oxygen Delivery Method: Simple face mask Preoxygenation: Pre-oxygenation with 100% oxygen Placement Confirmation: positive ETCO2 Dental Injury: Teeth and Oropharynx as per pre-operative assessment  Comments: Pom. Biteblock easily placed.

## 2022-03-15 NOTE — Discharge Instructions (Signed)

## 2022-03-15 NOTE — Anesthesia Preprocedure Evaluation (Signed)
Anesthesia Evaluation  Patient identified by MRN, date of birth, ID band Patient awake    Reviewed: Allergy & Precautions, NPO status , Patient's Chart, lab work & pertinent test results  History of Anesthesia Complications Negative for: history of anesthetic complications  Airway Mallampati: II  TM Distance: >3 FB Neck ROM: Full    Dental  (+) Edentulous Upper, Edentulous Lower, Dental Advisory Given   Pulmonary shortness of breath, sleep apnea and Continuous Positive Airway Pressure Ventilation , COPD,  COPD inhaler, former smoker,     + decreased breath sounds      Cardiovascular hypertension, Pt. on medications + CAD, + Cardiac Stents and +CHF   Rhythm:Regular  ECHO 12/22 1. Left ventricular ejection fraction, by estimation, is 60 to 65%. The  left ventricle has normal function. The left ventricle has no regional  wall motion abnormalities. Left ventricular diastolic parameters are  consistent with Grade I diastolic  dysfunction (impaired relaxation).  2. Right ventricular systolic function is normal. The right ventricular  size is normal. Tricuspid regurgitation signal is inadequate for assessing  PA pressure.  3. Left atrial size was mildly dilated.  4. The mitral valve is grossly normal. No evidence of mitral valve  regurgitation.  5. The aortic valve is grossly normal. Aortic valve regurgitation is not  visualized.  6. The inferior vena cava IVC not well visualized.    Neuro/Psych negative neurological ROS  negative psych ROS   GI/Hepatic Neg liver ROS, GERD  Medicated,odynophagia, dysphagia, abnormal barium swalow   Endo/Other    Renal/GU CRFRenal diseaseLab Results      Component                Value               Date                      CREATININE               1.45 (H)            09/10/2021                Musculoskeletal  (+) Arthritis , Osteoarthritis,    Abdominal   Peds  Hematology    Anesthesia Other Findings   Reproductive/Obstetrics                             Anesthesia Physical  Anesthesia Plan  ASA: 3  Anesthesia Plan: MAC   Post-op Pain Management: Minimal or no pain anticipated   Induction: Intravenous  PONV Risk Score and Plan: 1 and Treatment may vary due to age or medical condition and Propofol infusion  Airway Management Planned: Natural Airway and Simple Face Mask  Additional Equipment: None  Intra-op Plan:   Post-operative Plan:   Informed Consent: I have reviewed the patients History and Physical, chart, labs and discussed the procedure including the risks, benefits and alternatives for the proposed anesthesia with the patient or authorized representative who has indicated his/her understanding and acceptance.     Dental advisory given  Plan Discussed with: CRNA and Anesthesiologist  Anesthesia Plan Comments:         Anesthesia Quick Evaluation

## 2022-03-15 NOTE — Transfer of Care (Signed)
Immediate Anesthesia Transfer of Care Note  Patient: Randall Montoya  Procedure(s) Performed: UPPER ENDOSCOPIC ULTRASOUND (EUS) RADIAL ESOPHAGOGASTRODUODENOSCOPY (EGD) BIOPSY  Patient Location: PACU and Endoscopy Unit  Anesthesia Type:MAC  Level of Consciousness: drowsy and patient cooperative  Airway & Oxygen Therapy: Patient Spontanous Breathing and Patient connected to face mask oxygen  Post-op Assessment: Report given to RN and Post -op Vital signs reviewed and stable  Post vital signs: Reviewed and stable  Last Vitals:  Vitals Value Taken Time  BP 132/65 03/15/22 1427  Temp 36.6 C 03/15/22 1425  Pulse 65 03/15/22 1429  Resp 16 03/15/22 1429  SpO2 99 % 03/15/22 1429  Vitals shown include unvalidated device data.  Last Pain:  Vitals:   03/15/22 1425  TempSrc: Tympanic  PainSc: Asleep         Complications: No notable events documented.

## 2022-03-15 NOTE — Op Note (Signed)
[facility] Patient Name: Randall Montoya Procedure Date: 03/15/2022 MRN: [id number] Attending MD: Justice Britain , MD, [id number] Date of Birth: Oct 11, 1950 CSN: [id number] Age: 71 Admit Type: Outpatient Procedure:                Upper EUS Indications:              Gastric deformity on endoscopy/Subepithelial tumor                            versus extrinsic impression Providers:                Justice Britain, MD, Doristine Johns, RN, Cletis Athens, Technician, Brien Mates, RNFA Referring MD:             [name]. Fuller Plan, MD Medicines:                Monitored Anesthesia Care Complications:            No immediate complications. Estimated Blood Loss:     Estimated blood loss was minimal. Procedure:                Pre-Anesthesia Assessment:                           - Prior to the procedure, a History and Physical                            was performed, and patient medications and                            allergies were reviewed. The patient's tolerance of                            previous anesthesia was also reviewed. The risks                            and benefits of the procedure and the sedation                            options and risks were discussed with the patient.                            All questions were answered, and informed consent                            was obtained. Prior Anticoagulants: The patient has                            taken Plavix (clopidogrel), last dose was 5 days                            prior to procedure. ASA Grade Assessment: III - A  patient with severe systemic disease. After                            reviewing the risks and benefits, the patient was                            deemed in satisfactory condition to undergo the                            procedure.                           After obtaining informed consent, the endoscope was                             passed under direct vision. Throughout the                            procedure, the patient's blood pressure, pulse, and                            oxygen saturations were monitored continuously. The                            [id number] ([id number]) Olympus endoscope was introduced                            through the mouth, and advanced to the second part                            of duodenum. The [id number]-[id number] ([id number]) Olympus                            radial ultrasound scope was introduced through the                            mouth, and advanced to the stomach for ultrasound                            examination. The [id number] ([id number]) Olympus                            Forward View EUS was introduced through the and                            advanced to the. The upper EUS was accomplished                            without difficulty. The patient tolerated the                            procedure. Scope In: Scope Out: Findings:      ENDOSCOPIC FINDING: :      No gross lesions were noted in the entire esophagus.  The Z-line was irregular and was found 43 cm from the incisors.      A single 14 mm subepithelial papule (nodule) with no bleeding and no       stigmata of recent bleeding was found in the gastric antrum.      A few dispersed diminutive erosions with no bleeding and no stigmata of       recent bleeding were found in the gastric antrum.      No other gross lesions were noted in the entire examined stomach.       Biopsies were taken with a cold forceps for histology and Helicobacter       pylori testing.      No gross lesions were noted in the duodenal bulb, in the first portion       of the duodenum and in the second portion of the duodenum.      ENDOSONOGRAPHIC FINDING: :      A lobulated intramural (subepithelial) lesion was found in the antrum of       the stomach. The lesion was cystic-appearing. Sonographically, the       lesion appeared to  originate from the submucosa (Layer 3). The lesion       measured 9 mm (in maximum thickness). The lesion also measured 7 mm in       diameter. The outer endosonographic borders were well defined. There was       no evidence of significant blood flow to suggest this being a varix. Impression:               EGD impression:                           - No gross lesions in the entire esophagus.                           - Z-line irregular, 43 cm from the incisors.                           - A single subepithelial papule (nodule) found in                            the stomach.                           - Erosive gastropathy with no bleeding and no                            stigmata of recent bleeding.                           - No gross lesions in the entire stomach. Biopsied.                           - No gross lesions in the duodenal bulb, in the                            first portion of the duodenum and in the second  portion of the duodenum.                           EUS impression:                           - An intramural (subepithelial) lesion was found in                            the antrum of the stomach. The lesion appeared to                            originate from within the submucosa (Layer 3).                            Tissue has not been obtained. 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 Moderate Sedation:      Not Applicable - Patient had care per Anesthesia. Recommendation:           - The patient will be observed post-procedure,                            until all discharge criteria are met.                           - Discharge patient to home.                           - Patient has a contact number available for                            emergencies. The signs and symptoms of potential                            delayed complications were  discussed with the                            patient. Return to normal activities tomorrow.                            Written discharge instructions were provided to the                            patient.                           - Resume previous diet.                           - Observe patient's clinical course.                           -  Await path results.                           - Consider repeat EUS in 2 years and if overall                            stable then no additional evaluation will likely be                            required thereafter.                           - The findings and recommendations were discussed                            with the patient. Procedure Code(s):        --- Professional ---                           [phone], Esophagogastroduodenoscopy, flexible,                            transoral; with endoscopic ultrasound examination                            limited to the esophagus, stomach or duodenum, and                            adjacent structures                           43239, Esophagogastroduodenoscopy, flexible,                            transoral; with biopsy, single or multiple Diagnosis Code(s):        --- Professional ---                           K22.89, Other specified disease of esophagus                           K31.89, Other diseases of stomach and duodenum CPT copyright 2022 [employer]. All rights reserved. The codes documented in this report are preliminary and upon coder review may  be revised to meet current compliance requirements. Justice Britain, MD 03/15/2022 2:41:01 PM Number of Addenda: 0

## 2022-03-16 LAB — SURGICAL PATHOLOGY

## 2022-03-18 ENCOUNTER — Encounter (HOSPITAL_COMMUNITY): Payer: Self-pay | Admitting: Gastroenterology

## 2022-03-18 ENCOUNTER — Encounter: Payer: Self-pay | Admitting: Gastroenterology

## 2022-04-02 NOTE — Anesthesia Postprocedure Evaluation (Signed)
Anesthesia Post Note  Patient: Randall Montoya  Procedure(s) Performed: UPPER ENDOSCOPIC ULTRASOUND (EUS) RADIAL ESOPHAGOGASTRODUODENOSCOPY (EGD) BIOPSY     Patient location during evaluation: PACU Anesthesia Type: MAC Level of consciousness: awake and alert Pain management: pain level controlled Vital Signs Assessment: post-procedure vital signs reviewed and stable Respiratory status: spontaneous breathing, nonlabored ventilation, respiratory function stable and patient connected to nasal cannula oxygen Cardiovascular status: blood pressure returned to baseline and stable Postop Assessment: no apparent nausea or vomiting Anesthetic complications: no   No notable events documented.  Last Vitals:  Vitals:   03/15/22 1435 03/15/22 1445  BP: (!) 107/47 139/68  Pulse: 64 60  Resp: 15 15  Temp:    SpO2: 97% 98%    Last Pain:  Vitals:   03/15/22 1445  TempSrc:   PainSc: 0-No pain                 Lynda Rainwater

## 2022-04-18 ENCOUNTER — Emergency Department (HOSPITAL_COMMUNITY): Payer: Medicare Other

## 2022-04-18 ENCOUNTER — Observation Stay (HOSPITAL_COMMUNITY)
Admission: EM | Admit: 2022-04-18 | Discharge: 2022-04-20 | Disposition: A | Discharge status: Home / Self Care | Payer: Medicare Other | Attending: Internal Medicine | Admitting: Internal Medicine

## 2022-04-18 DIAGNOSIS — Z7982 Long term (current) use of aspirin: Secondary | ICD-10-CM | POA: Diagnosis not present

## 2022-04-18 DIAGNOSIS — E785 Hyperlipidemia, unspecified: Secondary | ICD-10-CM | POA: Insufficient documentation

## 2022-04-18 DIAGNOSIS — N1831 Chronic kidney disease, stage 3a: Secondary | ICD-10-CM | POA: Insufficient documentation

## 2022-04-18 DIAGNOSIS — I130 Hypertensive heart and chronic kidney disease with heart failure and stage 1 through stage 4 chronic kidney disease, or unspecified chronic kidney disease: Secondary | ICD-10-CM | POA: Insufficient documentation

## 2022-04-18 DIAGNOSIS — Z79899 Other long term (current) drug therapy: Secondary | ICD-10-CM | POA: Insufficient documentation

## 2022-04-18 DIAGNOSIS — Z955 Presence of coronary angioplasty implant and graft: Secondary | ICD-10-CM | POA: Insufficient documentation

## 2022-04-18 DIAGNOSIS — Z6836 Body mass index (BMI) 36.0-36.9, adult: Secondary | ICD-10-CM | POA: Insufficient documentation

## 2022-04-18 DIAGNOSIS — R0789 Other chest pain: Secondary | ICD-10-CM | POA: Diagnosis not present

## 2022-04-18 DIAGNOSIS — I200 Unstable angina: Secondary | ICD-10-CM | POA: Diagnosis present

## 2022-04-18 DIAGNOSIS — I25110 Atherosclerotic heart disease of native coronary artery with unstable angina pectoris: Secondary | ICD-10-CM | POA: Diagnosis not present

## 2022-04-18 DIAGNOSIS — K219 Gastro-esophageal reflux disease without esophagitis: Secondary | ICD-10-CM | POA: Insufficient documentation

## 2022-04-18 DIAGNOSIS — N179 Acute kidney failure, unspecified: Secondary | ICD-10-CM | POA: Diagnosis not present

## 2022-04-18 DIAGNOSIS — E669 Obesity, unspecified: Secondary | ICD-10-CM | POA: Diagnosis not present

## 2022-04-18 DIAGNOSIS — J449 Chronic obstructive pulmonary disease, unspecified: Secondary | ICD-10-CM | POA: Diagnosis not present

## 2022-04-18 DIAGNOSIS — R079 Chest pain, unspecified: Secondary | ICD-10-CM | POA: Diagnosis present

## 2022-04-18 DIAGNOSIS — Z951 Presence of aortocoronary bypass graft: Secondary | ICD-10-CM | POA: Diagnosis not present

## 2022-04-18 DIAGNOSIS — G629 Polyneuropathy, unspecified: Secondary | ICD-10-CM | POA: Diagnosis not present

## 2022-04-18 DIAGNOSIS — I5032 Chronic diastolic (congestive) heart failure: Secondary | ICD-10-CM | POA: Insufficient documentation

## 2022-04-18 DIAGNOSIS — Z7902 Long term (current) use of antithrombotics/antiplatelets: Secondary | ICD-10-CM | POA: Insufficient documentation

## 2022-04-18 LAB — BASIC METABOLIC PANEL
Anion gap: 12 (ref 5–15)
BUN: 41 mg/dL — ABNORMAL HIGH (ref 8–23)
CO2: 24 mmol/L (ref 22–32)
Calcium: 9.4 mg/dL (ref 8.9–10.3)
Chloride: 103 mmol/L (ref 98–111)
Creatinine, Ser: 1.82 mg/dL — ABNORMAL HIGH (ref 0.61–1.24)
GFR, Estimated: 39 mL/min — ABNORMAL LOW (ref >60)
Glucose, Bld: 107 mg/dL — ABNORMAL HIGH (ref 70–99)
Potassium: 4.1 mmol/L (ref 3.5–5.1)
Sodium: 139 mmol/L (ref 135–145)

## 2022-04-18 LAB — TROPONIN I (HIGH SENSITIVITY)
Troponin I (High Sensitivity): 5 ng/L (ref <18)
Troponin I (High Sensitivity): 5 ng/L (ref <18)

## 2022-04-18 LAB — CBC
HCT: 44.8 % (ref 39.0–52.0)
Hemoglobin: 14.6 g/dL (ref 13.0–17.0)
MCH: 29 pg (ref 26.0–34.0)
MCHC: 32.6 g/dL (ref 30.0–36.0)
MCV: 89.1 fL (ref 80.0–100.0)
Platelets: 211 10*3/uL (ref 150–400)
RBC: 5.03 MIL/uL (ref 4.22–5.81)
RDW: 15.5 % (ref 11.5–15.5)
WBC: 7 10*3/uL (ref 4.0–10.5)
nRBC: 0 % (ref 0.0–0.2)

## 2022-04-18 LAB — BRAIN NATRIURETIC PEPTIDE: B Natriuretic Peptide: 8.3 pg/mL (ref 0.0–100.0)

## 2022-04-18 MED ORDER — ENOXAPARIN SODIUM 40 MG/0.4ML IJ SOSY
40.0000 mg | PREFILLED_SYRINGE | INTRAMUSCULAR | Status: DC
  Administered 2022-04-19 – 2022-04-20 (×2): 40 mg via SUBCUTANEOUS
  Filled 2022-04-18 (×2): qty 0.4

## 2022-04-18 MED ORDER — ASPIRIN 81 MG PO CHEW: 324.0000 mg | CHEWABLE_TABLET | Freq: Once | ORAL | Status: DC

## 2022-04-18 NOTE — ED Triage Notes (Addendum)
From home with chest cramping getting worse throughout the day. Used nitro spray x 2 and did give him some relief. Significant hx of cardiac problems. Has had 324 aspirin given by EMS. 5/10 wraps around left side. 2L Liberal continuously. Reports he gets SOB when pain hits. Also endorses nausea. Takes Plavix.

## 2022-04-18 NOTE — ED Provider Triage Note (Signed)
Emergency Medicine Provider Triage Evaluation Note  Randall Montoya , a 71 y.o. male  was evaluated in triage.  Pt complains of CP that is L sided and radiates to L arm. States it feels like his prior heart attack. Hx of htn./hld, CHF, CAD, OSA on CPAP, COPD  States he did recently have esophageal surgery. No forceful emesis. Nauseated slightly. SOB as well cough for 1 week  Nitroglycerin w some relief.   Cardiologist is at [employer].  Review of Systems  Positive: CP,sob, nausea Negative: Fever   Physical Exam  BP 108/64 (BP Location: Left Arm)   Pulse 61   Temp 98.1 F (36.7 C) (Oral)   Resp 18   SpO2 95%  Gen:   Awake, no distress   Resp:  Normal effort  MSK:   Moves extremities without difficulty  Other:  Faint wheeze, speaking in full sentences.   Medical Decision Making  Medically screening exam initiated at 8:37 PM.  Appropriate orders placed.  Randall Montoya was informed that the remainder of the evaluation will be completed by another provider, this initial triage assessment does not replace that evaluation, and the importance of remaining in the ED until their evaluation is complete.  Labs, EKG, Leatrice Jewels [place], [place] 04/18/22 2040

## 2022-04-18 NOTE — H&P (Signed)
History and Physical  Randall Montoya WUJ:[id number] DOB: Dec 29, 1950 DOA: 04/18/2022  Referring physician: Dr. Theresia Lo, EDP. PCP: Valerie Roys, FNP  Outpatient Specialists: Cardiology Patient coming from: Home.  Chief Complaint: Chest pain.  HPI: Randall Montoya is a 71 y.o. male with medical history significant for coronary artery disease status post CABG, PCI with stents placement on Plavix, chronic diastolic CHF, essential hypertension, hyperlipidemia, COPD on 2 L nasal cannula at baseline, who presents to [facility] ED with complaints of sudden onset of nonexertional chest pain.    Started in the morning.  He was sitting down and building his miniature churches when suddenly he felt left sided chest pain going across his left chest and radiating to his back.    He describes the pain as similar to prior chest pain related to his previous heart attack.  The pain is cramping-like, localized in the left side of his chest, improved with nitroglycerin.  Associated with progressive shortness of breath and 20 pound weight gain in the last month.  Due to his worsening symptomatology, the patient activated EMS.  Presented to the ED for further evaluation.  He received a full dose aspirin en route by EMS.  In the ED, first set of high-sensitivity troponin negative.  No evidence of acute ischemia on twelve-lead EKG.  Heart score 5.  EDP requested admission for further evaluation.  Patient was admitted by [facility], hospitalist service.  ED Course: Tmax 98.1.  BP 107/60, pulse 55, respiratory 17, O2 saturation 96% on 2 L nasal cannula.  Lab studies remarkable for BUN 41, creatinine 1.82 above baseline of 1.45.  Review of Systems: Review of systems as noted in the HPI. All other systems reviewed and are negative.   Past Medical History:  Diagnosis Date   Arthritis    Blood transfusion without reported diagnosis    trauma 1975   Brain syndrome    Cataract    Congestive heart failure (CHF) (HCC)     COPD (chronic obstructive pulmonary disease) (HCC)    Dyspnea    GERD (gastroesophageal reflux disease)    Hyperlipidemia    Hypertension    Kidney failure    Myocardial infarction [facility])    Oxygen deficiency    Prediabetes    Sleep apnea    Past Surgical History:  Procedure Laterality Date   BIOPSY  09/12/2021   Procedure: BIOPSY;  Surgeon: Meryl Dare, MD;  Location: [facility] ENDOSCOPY;  Service: Gastroenterology;;   BIOPSY  01/25/2022   Procedure: BIOPSY;  Surgeon: Meryl Dare, MD;  Location: [facility] ENDOSCOPY;  Service: Gastroenterology;;   BIOPSY  03/15/2022   Procedure: BIOPSY;  Surgeon: Lemar Lofty., MD;  Location: [facility] ENDOSCOPY;  Service: Gastroenterology;;   COLONOSCOPY WITH PROPOFOL N/A 01/25/2022   Procedure: COLONOSCOPY WITH PROPOFOL;  Surgeon: Meryl Dare, MD;  Location: [facility] ENDOSCOPY;  Service: Gastroenterology;  Laterality: N/A;   ESOPHAGOGASTRODUODENOSCOPY N/A 03/15/2022   Procedure: ESOPHAGOGASTRODUODENOSCOPY (EGD);  Surgeon: Lemar Lofty., MD;  Location: [facility] ENDOSCOPY;  Service: Gastroenterology;  Laterality: N/A;   ESOPHAGOGASTRODUODENOSCOPY (EGD) WITH PROPOFOL N/A 09/12/2021   Procedure: ESOPHAGOGASTRODUODENOSCOPY (EGD) WITH PROPOFOL;  Surgeon: Meryl Dare, MD;  Location: [facility] ENDOSCOPY;  Service: Gastroenterology;  Laterality: N/A;   ESOPHAGOGASTRODUODENOSCOPY (EGD) WITH PROPOFOL N/A 01/25/2022   Procedure: ESOPHAGOGASTRODUODENOSCOPY (EGD) WITH PROPOFOL;  Surgeon: Meryl Dare, MD;  Location: [facility] ENDOSCOPY;  Service: Gastroenterology;  Laterality: N/A;   EUS N/A 03/15/2022   Procedure: UPPER ENDOSCOPIC ULTRASOUND (EUS) RADIAL;  Surgeon: Corliss Parish  Montez Hageman., MD;  Location: [facility] ENDOSCOPY;  Service: Gastroenterology;  Laterality: N/A;   heart stent     INSERTION OF MESH N/A 09/05/2021   Procedure: INSERTION OF MESH;  Surgeon: Diamantina Monks, MD;  Location: [facility] OR;  Service: General;  Laterality: N/A;   myocardial infarction     POLYPECTOMY   01/25/2022   Procedure: POLYPECTOMY;  Surgeon: Meryl Dare, MD;  Location: [facility] ENDOSCOPY;  Service: Gastroenterology;;   VENTRAL HERNIA REPAIR N/A 09/05/2021   Procedure: LAPAROSCOPIC VENTRAL HERNIA REPAIR;  Surgeon: Diamantina Monks, MD;  Location: [facility] OR;  Service: General;  Laterality: N/A;    Social History:  reports that he has quit smoking. His smoking use included cigarettes. He has never used smokeless tobacco. He reports that he does not currently use alcohol. He reports that he does not use drugs.   No Known Allergies  Family History  Problem Relation Age of Onset   Heart disease Mother    Lung cancer Father    Lung cancer Brother    Colon cancer Neg Hx    Colon polyps Neg Hx    Esophageal cancer Neg Hx    Stomach cancer Neg Hx    Rectal cancer Neg Hx       Prior to Admission medications   Medication Sig Start Date End Date Taking? Authorizing Provider  albuterol (PROVENTIL) (2.5 MG/3ML) 0.083% nebulizer solution Take 3 mLs (2.5 mg total) by nebulization every 4 (four) hours as needed for wheezing or shortness of breath. 06/20/21 06/20/22  Pokhrel, Rebekah Chesterfield, MD  albuterol (VENTOLIN HFA) 108 (90 Base) MCG/ACT inhaler Inhale 1 puff into the lungs every 6 (six) hours as needed for wheezing or shortness of breath. 03/22/21   [provider]  amLODipine (NORVASC) 10 MG tablet Take 10 mg by mouth daily. 03/31/21   [provider]  aspirin EC 81 MG tablet Take 81 mg by mouth daily. Swallow whole.    [provider]  atorvastatin (LIPITOR) 80 MG tablet Take 1 tablet by mouth daily. 12/07/21   [provider]  bisoprolol-hydrochlorothiazide (ZIAC) 10-6.25 MG tablet Take 1 tablet by mouth daily. 11/27/21   [provider]  bumetanide (BUMEX) 2 MG tablet Take 2 mg by mouth daily. 08/21/21   [provider]  candesartan (ATACAND) 16 MG tablet Take 16 mg by mouth daily. 02/23/22   [provider]  cholecalciferol (VITAMIN D3) 25 MCG  (1000 UNIT) tablet Take 1,000 Units by mouth daily.    [provider]  clopidogrel (PLAVIX) 75 MG tablet Take 1 tablet by mouth daily. 01/01/22   [provider]  colchicine 0.6 MG tablet Take 1 tablet (0.6 mg total) by mouth daily. 06/20/21   Pokhrel, Rebekah Chesterfield, MD  diclofenac Sodium (VOLTAREN) 1 % GEL Apply 2 g topically 4 (four) times daily as needed (aches and pains).    [provider]  gabapentin (NEURONTIN) 100 MG capsule Take 1 capsule (100 mg total) by mouth 2 (two) times daily. Patient taking differently: Take 100 mg by mouth 3 (three) times daily. 05/06/21   Regalado, Belkys A, MD  ibuprofen (ADVIL) 600 MG tablet Take 1 tablet (600 mg total) by mouth every 6 (six) hours as needed. 09/11/21   Diamantina Monks, MD  lisinopril (ZESTRIL) 40 MG tablet Take 40 mg by mouth daily. 06/25/21   [provider]  methocarbamol (ROBAXIN-750) 750 MG tablet Take 1 tablet (750 mg total) by mouth 4 (four) times daily. Patient taking differently: Take  750 mg by mouth every 6 (six) hours as needed for muscle spasms. 09/11/21   Diamantina Monks, MD  Na Sulfate-K Sulfate-Mg Sulf 17.5-3.13-1.6 GM/177ML SOLN Take 1 kit by mouth as directed. May use generic Suprep, no prior authorization. Take as directed. Patient not taking: Reported on 03/09/2022 01/11/22   Meryl Dare, MD  nitroGLYCERIN (NITROLINGUAL) 0.4 MG/SPRAY spray Place 1 spray under the tongue every 5 (five) minutes x 3 doses as needed for chest pain. 01/01/22   [provider]  oxybutynin (DITROPAN-XL) 5 MG 24 hr tablet Take 5 mg by mouth daily. 04/30/21   [provider]  pantoprazole (PROTONIX) 40 MG tablet Take 1 tablet (40 mg total) by mouth daily. 10/17/21   Meredith Pel, NP  tamsulosin (FLOMAX) 0.4 MG CAPS capsule Take 0.4 mg by mouth daily. 04/30/21   [provider]  TRELEGY ELLIPTA 100-62.5-25 MCG/ACT AEPB Inhale 1 puff into the lungs daily. 04/11/21   [provider]     Physical Exam: BP 115/63   Pulse (!) 52   Temp 98.1 F (36.7 C) (Oral)   Resp (!) 21   Ht 5\' 8"  (1.727 m)   Wt 108.9 kg   SpO2 98%   BMI 36.49 kg/m   General: 71 y.o. year-old male well developed well nourished in no acute distress.  Alert and oriented x3. Cardiovascular: Regular rate and rhythm with no rubs or gallops.  No thyromegaly or JVD noted.  No lower extremity edema. 2/4 pulses in all 4 extremities. Respiratory: Clear to auscultation with no wheezes or rales. Good inspiratory effort. Abdomen: Soft nontender nondistended with normal bowel sounds x4 quadrants. Muskuloskeletal: No cyanosis, clubbing or edema noted bilaterally Neuro: CN II-XII intact, strength, sensation, reflexes Skin: No ulcerative lesions noted or rashes Psychiatry: Judgement and insight appear normal. Mood is appropriate for condition and setting          Labs on Admission:  Basic Metabolic Panel: Recent Labs  Lab 04/18/22 2050  NA 139  K 4.1  CL 103  CO2 24  GLUCOSE 107*  BUN 41*  CREATININE 1.82*  CALCIUM 9.4   Liver Function Tests: No results for input(s): "AST", "ALT", "ALKPHOS", "BILITOT", "PROT", "ALBUMIN" in the last 168 hours. No results for input(s): "LIPASE", "AMYLASE" in the last 168 hours. No results for input(s): "AMMONIA" in the last 168 hours. CBC: Recent Labs  Lab 04/18/22 2050  WBC 7.0  HGB 14.6  HCT 44.8  MCV 89.1  PLT 211   Cardiac Enzymes: No results for input(s): "CKTOTAL", "CKMB", "CKMBINDEX", "TROPONINI" in the last 168 hours.  BNP (last 3 results) Recent Labs    05/04/21 1355 06/16/21 1829  BNP 12.1 65.8    ProBNP (last 3 results) No results for input(s): "PROBNP" in the last 8760 hours.  CBG: No results for input(s): "GLUCAP" in the last 168 hours.  Radiological Exams on Admission: DG Chest 2 View  Result Date: 04/18/2022 CLINICAL DATA:  Chest pain, cramping EXAM: CHEST - 2 VIEW COMPARISON:  06/16/2021, 12/11/2021 FINDINGS: Frontal and  lateral views of the chest demonstrate a stable cardiac silhouette. There is chronic elevation of the left hemidiaphragm, with chronic left pleural thickening again noted. No airspace disease, effusion, or pneumothorax. No acute bony abnormalities. Chronic postsurgical or posttraumatic changes of the left fourth rib. IMPRESSION: 1. Stable chest, no acute process. Electronically Signed   By: Sharlet Salina M.D.   On: 04/18/2022 21:11    EKG: I independently viewed the EKG done  and my findings are as followed: Normal sinus rhythm 62.  Nonspecific ST-T changes.  QTc 428.  Assessment/Plan Present on Admission:  Chest pain  Principal Problem:   Chest pain  Chest pain, rule out ACS. Nonexertional Similar to prior chest pain that led to MI. Symptoms improved with nitroglycerin Heart score 5 First set high-sensitivity troponin negative No evidence of acute ischemia on her EKG Follow 2D echo Monitor on telemetry Consult cardiology in the morning  AKI on CKD 3 A Baseline creatinine appears to be 1.4 with GFR of 52. Presented with creatinine of 1.82 with GFR of 39 Hold off home diuretics and ARB. Avoid nephrotoxic agents, dehydration and hypotension. Monitor urine output Repeat renal function test in the morning.  Coronary artery disease status post CABG, PCI with stent Resume home aspirin, Plavix and Lipitor Closely monitor on telemetry  Chronic diastolic CHF Last 2D echo done on 10/03/2020 revealed LVEF 60 to 65% with grade 1 diastolic dysfunction. Start strict I's and O's and daily weight Restart home cardiac medications Follow repeat 2D echo.  Essential hypertension Hold off home ARB and diuretics Monitor vital signs.  Hyperlipidemia Resume home Lipitor.  Polyneuropathy Resume home regimen  GERD Resume home regimen     DVT prophylaxis: Subcu Lovenox daily  Code Status: Full code  Family Communication: None at bedside  Disposition Plan: Admitted to telemetry  cardiac unit  Consults called: None.  Admission status: Observation status.   Status is: Observation    Darlin Drop MD [facility] Pager [phone]  If 7PM-7AM, please contact night-coverage [email or web address] Password [facility]  04/18/2022, 10:56 PM

## 2022-04-18 NOTE — ED Provider Notes (Signed)
[facility] EMERGENCY DEPARTMENT Provider Note   CSN: [id number] Arrival date & time: 04/18/22  2025     History  Chief Complaint  Patient presents with   Chest Pain    TIM CORRIHER is a 71 y.o. male.  Patient is a 71 year old male with a past medical history of CAD status post stents and CABG, hypertension, COPD on 2 L nasal cannula senting to the emergency department with chest pain.  The patient states the last 2 days he has had a cramping type of pain in the left side of his chest.  He states that he was taking nitro for his pain with some relief.  He states that he has also had increasing shortness of breath and feels like he has gained about 20 pounds in the last month.  He states he has had increased lower extremity swelling.  He states that he has had a productive cough of white sputum.  He denies any fevers or chills.  He states that today he started to feel the chest pain while at rest and was associated with lightheadedness and diaphoresis.  He states that the pain lasted for about 3 minutes until he took his nitro and then resolved.  He states he felt similar to when he had his previous heart attack.  The history is provided by the patient.  Chest Pain      Home Medications Prior to Admission medications   Medication Sig Start Date End Date Taking? Authorizing Provider  albuterol (PROVENTIL) (2.5 MG/3ML) 0.083% nebulizer solution Take 3 mLs (2.5 mg total) by nebulization every 4 (four) hours as needed for wheezing or shortness of breath. 06/20/21 06/20/22  Pokhrel, Corrie Mckusick, MD  albuterol (VENTOLIN HFA) 108 (90 Base) MCG/ACT inhaler Inhale 1 puff into the lungs every 6 (six) hours as needed for wheezing or shortness of breath. 03/22/21   [provider]  amLODipine (NORVASC) 10 MG tablet Take 10 mg by mouth daily. 03/31/21   [provider]  aspirin EC 81 MG tablet Take 81 mg by mouth daily. Swallow whole.    [provider]   atorvastatin (LIPITOR) 80 MG tablet Take 1 tablet by mouth daily. 12/07/21   [provider]  bisoprolol-hydrochlorothiazide (ZIAC) 10-6.25 MG tablet Take 1 tablet by mouth daily. 11/27/21   [provider]  bumetanide (BUMEX) 2 MG tablet Take 2 mg by mouth daily. 08/21/21   [provider]  candesartan (ATACAND) 16 MG tablet Take 16 mg by mouth daily. 02/23/22   [provider]  cholecalciferol (VITAMIN D3) 25 MCG (1000 UNIT) tablet Take 1,000 Units by mouth daily.    [provider]  clopidogrel (PLAVIX) 75 MG tablet Take 1 tablet by mouth daily. 01/01/22   [provider]  colchicine 0.6 MG tablet Take 1 tablet (0.6 mg total) by mouth daily. 06/20/21   Pokhrel, Corrie Mckusick, MD  diclofenac Sodium (VOLTAREN) 1 % GEL Apply 2 g topically 4 (four) times daily as needed (aches and pains).    [provider]  gabapentin (NEURONTIN) 100 MG capsule Take 1 capsule (100 mg total) by mouth 2 (two) times daily. Patient taking differently: Take 100 mg by mouth 3 (three) times daily. 05/06/21   Regalado, Belkys A, MD  ibuprofen (ADVIL) 600 MG tablet Take 1 tablet (600 mg total) by mouth every 6 (six) hours as needed. 09/11/21   Jesusita Oka, MD  lisinopril (ZESTRIL) 40 MG tablet Take 40 mg by mouth daily. 06/25/21  [provider]  methocarbamol (ROBAXIN-750) 750 MG tablet Take 1 tablet (750 mg total) by mouth 4 (four) times daily. Patient taking differently: Take 750 mg by mouth every 6 (six) hours as needed for muscle spasms. 09/11/21   Jesusita Oka, MD  Na Sulfate-K Sulfate-Mg Sulf 17.5-3.13-1.6 GM/177ML SOLN Take 1 kit by mouth as directed. May use generic Suprep, no prior authorization. Take as directed. Patient not taking: Reported on 03/09/2022 01/11/22   Ladene Artist, MD  nitroGLYCERIN (NITROLINGUAL) 0.4 MG/SPRAY spray Place 1 spray under the tongue every 5 (five) minutes x 3 doses as needed for chest pain. 01/01/22   [provider]  oxybutynin (DITROPAN-XL) 5 MG 24 hr tablet Take 5 mg by mouth daily. 04/30/21   [provider]  pantoprazole (PROTONIX) 40 MG tablet Take 1 tablet (40 mg total) by mouth daily. 10/17/21   Willia Craze, NP  tamsulosin (FLOMAX) 0.4 MG CAPS capsule Take 0.4 mg by mouth daily. 04/30/21   [provider]  TRELEGY ELLIPTA 100-62.5-25 MCG/ACT AEPB Inhale 1 puff into the lungs daily. 04/11/21   [provider]      Allergies    Patient has no known allergies.    Review of Systems   Review of Systems  Cardiovascular:  Positive for chest pain.    Physical Exam Updated Vital Signs BP 115/63   Pulse (!) 52   Temp 98.1 F (36.7 C) (Oral)   Resp (!) 21   Ht _0  (1.727 m)   Wt 108.9 kg   SpO2 98%   BMI 36.49 kg/m  Physical Exam Vitals and nursing note reviewed.  Constitutional:      General: He is not in acute distress.    Appearance: He is well-developed.  HENT:     Head: Normocephalic and atraumatic.  Eyes:     Extraocular Movements: Extraocular movements intact.  Neck:     Vascular: JVD (Limited secondary to habitus) present.  Cardiovascular:     Rate and Rhythm: Normal rate and regular rhythm.     Pulses:          Radial pulses are 2+ on the right side and 2+ on the left side.     Heart sounds: Normal heart sounds.  Pulmonary:     Effort: Pulmonary effort is normal.     Breath sounds: Normal breath sounds.  Abdominal:     Palpations: Abdomen is soft.     Tenderness: There is no abdominal tenderness.  Musculoskeletal:        General: Normal range of motion.     Cervical back: Normal range of motion and neck supple.     Right lower leg: Edema (Nonpitting) present.     Left lower leg: Edema (1+ to mid shin) present.  Skin:    General: Skin is warm and dry.  Neurological:     General: No focal deficit present.     Mental Status: He is alert and oriented to person, place, and time.  Psychiatric:        Mood and Affect: Mood  normal.        Behavior: Behavior normal.     ED Results / Procedures / Treatments   Labs (all labs ordered are listed, but only abnormal results are displayed) Labs Reviewed  BASIC METABOLIC PANEL - Abnormal; Notable for the following components:      Result Value   Glucose, Bld 107 (*)    BUN 41 (*)  Creatinine, Ser 1.82 (*)    GFR, Estimated 39 (*)    All other components within normal limits  CBC  BRAIN NATRIURETIC PEPTIDE  TROPONIN I (HIGH SENSITIVITY)  TROPONIN I (HIGH SENSITIVITY)    EKG EKG Interpretation  Date/Time:  Wednesday April 18 2022 20:33:10 EST Ventricular Rate:  62 PR Interval:  166 QRS Duration: 86 QT Interval:  422 QTC Calculation: 428 R Axis:   -13 Text Interpretation: Normal sinus rhythm Normal ECG No significant change since last tracing Confirmed by Leanord Asal ([id number]) on 04/18/2022 9:24:50 PM  Radiology DG Chest 2 View  Result Date: 04/18/2022 CLINICAL DATA:  Chest pain, cramping EXAM: CHEST - 2 VIEW COMPARISON:  06/16/2021, 12/11/2021 FINDINGS: Frontal and lateral views of the chest demonstrate a stable cardiac silhouette. There is chronic elevation of the left hemidiaphragm, with chronic left pleural thickening again noted. No airspace disease, effusion, or pneumothorax. No acute bony abnormalities. Chronic postsurgical or posttraumatic changes of the left fourth rib. IMPRESSION: 1. Stable chest, no acute process. Electronically Signed   By: Randa Ngo M.D.   On: 04/18/2022 21:11    Procedures Procedures    Medications Ordered in ED Medications  aspirin chewable tablet 324 mg (324 mg Oral Not Given 04/18/22 2048)    ED Course/ Medical Decision Making/ A&P Clinical Course as of 04/18/22 2254  Wed Apr 18, 2022  2244 Upon reassessment, the patient is currently chest pain-free.  Initial troponin was negative, however due to his high heart score and concern for unstable angina he will be admitted for observation. [VK]     Clinical Course User Index [VK] Kemper Durie, DO           HEART Score: 5                Medical Decision Making This patient presents to the ED with chief complaint(s) of chest pain with pertinent past medical history of CAD, COPD, HTN, CHF which further complicates the presenting complaint. The complaint involves an extensive differential diagnosis and also carries with it a high risk of complications and morbidity.    The differential diagnosis includes ACS, arrhythmia, anemia CHF exacerbation, pulmonary edema, pleural effusion, pneumonia, pneumothorax   Additional history obtained: Additional history obtained from N/A Records reviewed Primary Care Documents and recent endoscopy  ED Course and Reassessment: Upon patient's arrival to the emergency department, he is currently chest pain-free and hemodynamically stable.  Initial EKG has no acute ischemic changes.  He will have labs including troponin and BNP performed and will be closely reassessed.  He has already received aspirin and nitro by medics.  Independent labs interpretation:  The following labs were independently interpreted: Within normal range  Independent visualization of imaging: - I independently visualized the following imaging with scope of interpretation limited to determining acute life threatening conditions related to emergency care: Chest x-ray, which revealed no acute disease  Consultation: - Consulted or discussed management/test interpretation w/ external professional: Hospitalist  Consideration for admission or further workup: Patient requires admission due to his high risk chest pain with a heart score of 5 Social Determinants of health: N/A    Amount and/or Complexity of Data Reviewed Labs: ordered. Radiology: ordered.          Final Clinical Impression(s) / ED Diagnoses Final diagnoses:  Unstable angina [facility])    Rx / DC Orders ED Discharge Orders     None          Kemper Durie, DO  04/18/22 2254

## 2022-04-19 ENCOUNTER — Observation Stay (HOSPITAL_BASED_OUTPATIENT_CLINIC_OR_DEPARTMENT_OTHER): Payer: Medicare Other

## 2022-04-19 DIAGNOSIS — I200 Unstable angina: Secondary | ICD-10-CM | POA: Diagnosis not present

## 2022-04-19 DIAGNOSIS — I25110 Atherosclerotic heart disease of native coronary artery with unstable angina pectoris: Secondary | ICD-10-CM | POA: Diagnosis not present

## 2022-04-19 DIAGNOSIS — R079 Chest pain, unspecified: Secondary | ICD-10-CM | POA: Diagnosis not present

## 2022-04-19 LAB — ECHOCARDIOGRAM COMPLETE
Area-P 1/2: 2.8 cm2
Calc EF: 52.3 %
Height: 68 in
MV M vel: 0.68 m/s
MV Peak grad: 1.8 mmHg
S' Lateral: 3.4 cm
Single Plane A2C EF: 43.6 %
Single Plane A4C EF: 63.2 %
Weight: 3840 oz

## 2022-04-19 LAB — CBC
HCT: 43.3 % (ref 39.0–52.0)
Hemoglobin: 14.1 g/dL (ref 13.0–17.0)
MCH: 29.2 pg (ref 26.0–34.0)
MCHC: 32.6 g/dL (ref 30.0–36.0)
MCV: 89.6 fL (ref 80.0–100.0)
Platelets: 194 10*3/uL (ref 150–400)
RBC: 4.83 MIL/uL (ref 4.22–5.81)
RDW: 15.4 % (ref 11.5–15.5)
WBC: 6.4 10*3/uL (ref 4.0–10.5)
nRBC: 0 % (ref 0.0–0.2)

## 2022-04-19 LAB — RENAL FUNCTION PANEL
Albumin: 3.8 g/dL (ref 3.5–5.0)
Anion gap: 11 (ref 5–15)
BUN: 42 mg/dL — ABNORMAL HIGH (ref 8–23)
CO2: 27 mmol/L (ref 22–32)
Calcium: 9.2 mg/dL (ref 8.9–10.3)
Chloride: 103 mmol/L (ref 98–111)
Creatinine, Ser: 1.67 mg/dL — ABNORMAL HIGH (ref 0.61–1.24)
GFR, Estimated: 44 mL/min — ABNORMAL LOW (ref >60)
Glucose, Bld: 118 mg/dL — ABNORMAL HIGH (ref 70–99)
Phosphorus: 3.8 mg/dL (ref 2.5–4.6)
Potassium: 4.3 mmol/L (ref 3.5–5.1)
Sodium: 141 mmol/L (ref 135–145)

## 2022-04-19 LAB — MAGNESIUM: Magnesium: 2 mg/dL (ref 1.7–2.4)

## 2022-04-19 MED ORDER — NITROGLYCERIN 0.4 MG/SPRAY TL SOLN: 1.0000 | Status: DC | PRN

## 2022-04-19 MED ORDER — SODIUM CHLORIDE 0.9 % IV SOLN: 250.0000 mL | INTRAVENOUS | Status: DC | PRN

## 2022-04-19 MED ORDER — ALBUTEROL SULFATE HFA 108 (90 BASE) MCG/ACT IN AERS: 1.0000 | INHALATION_SPRAY | Freq: Four times a day (QID) | RESPIRATORY_TRACT | Status: DC | PRN

## 2022-04-19 MED ORDER — SODIUM CHLORIDE 0.9% FLUSH
3.0000 mL | Freq: Two times a day (BID) | INTRAVENOUS | Status: DC
  Administered 2022-04-19 – 2022-04-20 (×3): 3 mL via INTRAVENOUS

## 2022-04-19 MED ORDER — FLUTICASONE FUROATE-VILANTEROL 100-25 MCG/ACT IN AEPB
1.0000 | INHALATION_SPRAY | Freq: Every day | RESPIRATORY_TRACT | Status: DC
  Administered 2022-04-20: 1 via RESPIRATORY_TRACT
  Filled 2022-04-19: qty 28

## 2022-04-19 MED ORDER — GABAPENTIN 100 MG PO CAPS
100.0000 mg | ORAL_CAPSULE | Freq: Three times a day (TID) | ORAL | Status: DC
  Administered 2022-04-19 – 2022-04-20 (×4): 100 mg via ORAL
  Filled 2022-04-19 (×4): qty 1

## 2022-04-19 MED ORDER — OXYBUTYNIN CHLORIDE ER 5 MG PO TB24
5.0000 mg | ORAL_TABLET | Freq: Every day | ORAL | Status: DC
  Administered 2022-04-19 – 2022-04-20 (×2): 5 mg via ORAL
  Filled 2022-04-19 (×2): qty 1

## 2022-04-19 MED ORDER — UMECLIDINIUM BROMIDE 62.5 MCG/ACT IN AEPB
1.0000 | INHALATION_SPRAY | Freq: Every day | RESPIRATORY_TRACT | Status: DC
  Administered 2022-04-20: 1 via RESPIRATORY_TRACT
  Filled 2022-04-19: qty 7

## 2022-04-19 MED ORDER — VITAMIN D 25 MCG (1000 UNIT) PO TABS
1000.0000 [IU] | ORAL_TABLET | Freq: Every day | ORAL | Status: DC
  Administered 2022-04-19 – 2022-04-20 (×2): 1000 [IU] via ORAL
  Filled 2022-04-19 (×2): qty 1

## 2022-04-19 MED ORDER — ASPIRIN 81 MG PO TBEC
81.0000 mg | DELAYED_RELEASE_TABLET | Freq: Every day | ORAL | Status: DC
  Administered 2022-04-19 – 2022-04-20 (×2): 81 mg via ORAL
  Filled 2022-04-19 (×3): qty 1

## 2022-04-19 MED ORDER — ALBUTEROL SULFATE (2.5 MG/3ML) 0.083% IN NEBU: 2.5000 mg | INHALATION_SOLUTION | Freq: Four times a day (QID) | RESPIRATORY_TRACT | Status: DC | PRN

## 2022-04-19 MED ORDER — COLCHICINE 0.6 MG PO TABS
0.6000 mg | ORAL_TABLET | Freq: Every day | ORAL | Status: DC
  Administered 2022-04-19 – 2022-04-20 (×2): 0.6 mg via ORAL
  Filled 2022-04-19 (×2): qty 1

## 2022-04-19 MED ORDER — PANTOPRAZOLE SODIUM 40 MG PO TBEC
40.0000 mg | DELAYED_RELEASE_TABLET | Freq: Every day | ORAL | Status: DC
  Administered 2022-04-19 – 2022-04-20 (×2): 40 mg via ORAL
  Filled 2022-04-19 (×2): qty 1

## 2022-04-19 MED ORDER — SODIUM CHLORIDE 0.9 % IV SOLN: INTRAVENOUS | Status: DC

## 2022-04-19 MED ORDER — TAMSULOSIN HCL 0.4 MG PO CAPS
0.4000 mg | ORAL_CAPSULE | Freq: Every day | ORAL | Status: DC
  Administered 2022-04-19 – 2022-04-20 (×2): 0.4 mg via ORAL
  Filled 2022-04-19 (×2): qty 1

## 2022-04-19 MED ORDER — BISOPROLOL FUMARATE 5 MG PO TABS
10.0000 mg | ORAL_TABLET | Freq: Every day | ORAL | Status: DC
  Administered 2022-04-19 – 2022-04-20 (×2): 10 mg via ORAL
  Filled 2022-04-19 (×2): qty 2
  Filled 2022-04-19: qty 1

## 2022-04-19 MED ORDER — CLOPIDOGREL BISULFATE 75 MG PO TABS
75.0000 mg | ORAL_TABLET | Freq: Every day | ORAL | Status: DC
  Administered 2022-04-19 – 2022-04-20 (×2): 75 mg via ORAL
  Filled 2022-04-19 (×2): qty 1

## 2022-04-19 MED ORDER — NITROGLYCERIN 0.4 MG SL SUBL: 0.4000 mg | SUBLINGUAL_TABLET | SUBLINGUAL | Status: DC | PRN

## 2022-04-19 MED ORDER — SODIUM CHLORIDE 0.9% FLUSH: 3.0000 mL | INTRAVENOUS | Status: DC | PRN

## 2022-04-19 MED ORDER — ATORVASTATIN CALCIUM 80 MG PO TABS
80.0000 mg | ORAL_TABLET | Freq: Every day | ORAL | Status: DC
  Administered 2022-04-19 – 2022-04-20 (×2): 80 mg via ORAL
  Filled 2022-04-19 (×2): qty 1

## 2022-04-19 MED ORDER — METHOCARBAMOL 500 MG PO TABS: 750.0000 mg | ORAL_TABLET | Freq: Four times a day (QID) | ORAL | Status: DC | PRN

## 2022-04-19 MED ORDER — PERFLUTREN LIPID MICROSPHERE
1.0000 mL | INTRAVENOUS | Status: AC | PRN
  Administered 2022-04-19: 6 mL via INTRAVENOUS

## 2022-04-19 MED ORDER — MELATONIN 3 MG PO TABS: 3.0000 mg | ORAL_TABLET | Freq: Every evening | ORAL | Status: DC | PRN

## 2022-04-19 MED ORDER — SODIUM CHLORIDE 0.9 % IV SOLN: INTRAVENOUS | Status: AC

## 2022-04-19 MED ORDER — ACETAMINOPHEN 325 MG PO TABS: 650.0000 mg | ORAL_TABLET | Freq: Four times a day (QID) | ORAL | Status: DC | PRN

## 2022-04-19 MED ORDER — ISOSORBIDE MONONITRATE ER 30 MG PO TB24
30.0000 mg | ORAL_TABLET | Freq: Every day | ORAL | Status: DC
  Administered 2022-04-19 – 2022-04-20 (×2): 30 mg via ORAL
  Filled 2022-04-19 (×2): qty 1

## 2022-04-19 NOTE — Progress Notes (Signed)
PROGRESS NOTE Randall Montoya  JKD:[id number] DOB: [birth year] DOA: 04/18/2022 PCP: Mardi Mainland, FNP   Brief Narrative/Hospital Course: 71 year old male with history of coronary artery disease status post PCI, CABG, CHF ( D), presented to the ED with sudden onset of nonexertional chest pain starting in the morning 11/29 and presented to the ED. pain described as his previous heart attack.  Also has progressive shortness of breath with 20 pound weight gain in the last month. Seen in the ED vitals stable,Heart score 5.  Troponin negative.No evidence of acute ischemia on lead 12 EKG. patient was admitted to rule out ACS  Seen this am no chest pain now.Had chest pain with diaphoresis yesterday On [place] 02 and has home 02 Echo completed-pending  Chest pain serial troponin negative likely atypical, follow-up echo: lvef 55-60%, G1DD,No RWMA,cardiology consulted AKI on CKD stage IIIa: On admission 1.2 and decreased to 1.6 Home diuretics ARB held hopefully can resume soon and need outpatient follow-up CAD with CABG PCI on aspirin Plavix Lipitor continue per cardiology Chronic diastolic CHF follow-up echo, currently volume status stable Essential hypertension: Holding ARB and diuretics.  BP stable Hyperlipidemia: On Lipitor Polyneuropathy stable GERD stable    Subjective: Seen this am No chest pain now.Had chest pain with diaphoresis yesterday On [place] 02 and has home 02 Echo completed-pending  Assessment and Plan: Principal Problem:   Chest pain   Chest pain concerning for unstable angina: serial troponin negative likely atypical, follow-up echo: lvef 55-60%, G1DD,No RWMA,cardiology consulted cardio> planning for IV fluid hydration BMP in the morning and a left heart cath if creatinine stable.  Continue aspirin and statin beta-blocker, Imdur and Plavix  AKI on CKD stage IIIa: On admission 1.2 and decreased to 1.6 Home diuretics ARB held hopefully can resume soon and need outpatient  follow-up. On gentle ivf overnight Recent Labs    06/18/21 0535 06/19/21 0451 06/20/21 0437 08/29/21 1136 09/06/21 0140 09/08/21 1118 09/09/21 0103 09/10/21 0647 04/18/22 2050 04/19/22 0611  BUN 38* 48* 64* 39* 26* 61* 74* 67* 41* 42*  CREATININE 1.38* 1.43* 1.56* 1.45* 1.48* 2.54* 2.48* 1.45* 1.82* 1.67*    CAD with CABG PCI on aspirin Plavix Lipitor continue per cardiology Chronic diastolic CHF follow-up echo, currently volume status stable Essential hypertension: Holding ARB and diuretics.  BP stable Hyperlipidemia: On Lipitor Polyneuropathy stable GERD stable  Class II Obesity:Patient's Body mass index is 36.49 kg/m. : Will benefit with PCP follow-up, weight loss  healthy lifestyle and outpatient sleep evaluation.   DVT prophylaxis: enoxaparin (LOVENOX) injection 40 mg Start: 04/19/22 0800 Code Status:   Code Status: Full Code Family Communication: plan of care discussed with patient at bedside. Patient status is: Admitted as observation remains hospitalized for further cardiac work-up  Level of care: Telemetry Cardiac  Dispo: The patient is from: HOME            Anticipated disposition: HOME tbd  Objective: Vitals last 24 hrs: Vitals:   04/19/22 0930 04/19/22 1000 04/19/22 1030 04/19/22 1400  BP: 127/70 117/66 120/63 132/73  Pulse: (!) 56 (!) 56 (!) 56 64  Resp: (!) 27 (!) 25 (!) 22 12  Temp:    (!) 97.5 F (36.4 C)  TempSrc:    Oral  SpO2: 99% 100% 98% 100%  Weight:      Height:       Weight change:   Physical Examination: General exam: alert awake, older than stated age HEENT:Oral mucosa moist, Ear/Nose WNL grossly Respiratory system: bilaterally clear BS,  no use of accessory muscle Cardiovascular system: S1 & S2 +, No JVD. Gastrointestinal system: Abdomen soft,NT,ND, BS+ Nervous System:Alert, awake, moving extremities. Extremities: LE edema neg,distal peripheral pulses palpable.  Skin: No rashes,no icterus. MSK: Normal muscle bulk,tone,  power  Medications reviewed: Scheduled Meds:  aspirin  324 mg Oral Once   aspirin EC  81 mg Oral Daily   atorvastatin  80 mg Oral Daily   bisoprolol  10 mg Oral Daily   clopidogrel  75 mg Oral Daily   colchicine  0.6 mg Oral Daily   enoxaparin (LOVENOX) injection  40 mg Subcutaneous Q24H   fluticasone furoate-vilanterol  1 puff Inhalation Daily   And   umeclidinium bromide  1 puff Inhalation Daily   isosorbide mononitrate  30 mg Oral Daily   oxybutynin  5 mg Oral Daily   pantoprazole  40 mg Oral Daily   sodium chloride flush  3 mL Intravenous Q12H   tamsulosin  0.4 mg Oral Daily   Continuous Infusions:  sodium chloride      Diet Order             Diet NPO time specified Except for: Sips with Meds  Diet effective midnight           Diet Heart Room service appropriate? Yes; Fluid consistency: Thin  Diet effective now                  No intake or output data in the 24 hours ending 04/19/22 1614 Net IO Since Admission: No IO data has been entered for this period [04/19/22 1614]  Wt Readings from Last 3 Encounters:  04/18/22 108.9 kg  03/15/22 108.4 kg  01/25/22 102.1 kg     Unresulted Labs (From admission, onward)     Start     Ordered   04/25/22 0500  Creatinine, serum  (enoxaparin (LOVENOX)    CrCl >/= 30 ml/min)  Weekly,   R     Comments: while on enoxaparin therapy    04/18/22 2256   04/20/22 0500  Lipid panel  Tomorrow morning,   R        04/19/22 1540   04/20/22 0500  TSH  Tomorrow morning,   R        04/19/22 1540          Data Reviewed: I have personally reviewed following labs and imaging studies CBC: Recent Labs  Lab 04/18/22 2050 04/19/22 0611  WBC 7.0 6.4  HGB 14.6 14.1  HCT 44.8 43.3  MCV 89.1 89.6  PLT 211 413   Basic Metabolic Panel: Recent Labs  Lab 04/18/22 2050 04/19/22 0611  NA 139 141  K 4.1 4.3  CL 103 103  CO2 24 27  GLUCOSE 107* 118*  BUN 41* 42*  CREATININE 1.82* 1.67*  CALCIUM 9.4 9.2  MG  --  2.0  PHOS  --   3.8   GFR: Estimated Creatinine Clearance: 49.3 mL/min (A) (by C-G formula based on SCr of 1.67 mg/dL (H)). Liver Function Tests: Recent Labs  Lab 04/19/22 0611  ALBUMIN 3.8   No results found for this or any previous visit (from the past 240 hour(s)).  Antimicrobials: Anti-infectives (From admission, onward)    None      Culture/Microbiology No results found for: "SDES", "SPECREQUEST", "CULT", "REPTSTATUS"  Radiology Studies: ECHOCARDIOGRAM COMPLETE  Result Date: 04/19/2022    ECHOCARDIOGRAM REPORT   Patient Name:   Randall Montoya Date of Exam: 04/19/2022 Medical Rec #:  [id number]      Height:       68.0 in Accession #:    [id number]     Weight:       240.0 lb Date of Birth:  1950/05/28      BSA:          2.208 m Patient Age:    57 years       BP:           127/70 mmHg Patient Gender: M              HR:           57 bpm. Exam Location:  Inpatient Procedure: 2D Echo, Cardiac Doppler, Color Doppler and Intracardiac            Opacification Agent Indications:    Chest Pain R07.9  History:        Patient has prior history of Echocardiogram examinations, most                 recent 05/05/2021. CHF, CAD and Previous Myocardial Infarction,                 Prior CABG, Signs/Symptoms:Chest Pain and Dyspnea; Risk                 Factors:Hypertension, Dyslipidemia, Former Smoker and Sleep                 Apnea.  Sonographer:    Greer Pickerel Referring Phys: [id number] [place]  Sonographer Comments: Suboptimal subcostal window and suboptimal apical window. Image acquisition challenging due to COPD, Image acquisition challenging due to respiratory motion and Image acquisition challenging due to patient body habitus. IMPRESSIONS  1. Left ventricular ejection fraction, by estimation, is 55 to 60%. The left ventricle has normal function. The left ventricle has no regional wall motion abnormalities. Left ventricular diastolic parameters are consistent with Grade I diastolic dysfunction (impaired  relaxation).  2. Right ventricular systolic function is normal. The right ventricular size is normal.  3. No evidence of mitral valve regurgitation.  4. The aortic valve was not well visualized. Aortic valve regurgitation is not visualized.  5. There is mild dilatation of the ascending aorta, measuring 38 mm. Comparison(s): No significant change from prior study. FINDINGS  Left Ventricle: Left ventricular ejection fraction, by estimation, is 55 to 60%. The left ventricle has normal function. The left ventricle has no regional wall motion abnormalities. Definity contrast agent was given IV to delineate the left ventricular  endocardial borders. The left ventricular internal cavity size was normal in size. There is no left ventricular hypertrophy. Abnormal (paradoxical) septal motion consistent with post-operative status. Left ventricular diastolic parameters are consistent  with Grade I diastolic dysfunction (impaired relaxation). Right Ventricle: The right ventricular size is normal. Right ventricular systolic function is normal. Left Atrium: Left atrial size was normal in size. Right Atrium: Right atrial size was normal in size. Pericardium: There is no evidence of pericardial effusion. Mitral Valve: No evidence of mitral valve regurgitation. Tricuspid Valve: Tricuspid valve regurgitation is not demonstrated. Aortic Valve: The aortic valve was not well visualized. Aortic valve regurgitation is not visualized. Pulmonic Valve: Pulmonic valve regurgitation is not visualized. Aorta: There is mild dilatation of the ascending aorta, measuring 38 mm. IAS/Shunts: The interatrial septum was not well visualized.  LEFT VENTRICLE PLAX 2D LVIDd:         4.40 cm      Diastology LVIDs:  3.40 cm      LV e' medial:    5.22 cm/s LV PW:         1.50 cm      LV E/e' medial:  10.3 LV IVS:        1.00 cm      LV e' lateral:   7.40 cm/s LVOT diam:     2.30 cm      LV E/e' lateral: 7.3 LV SV:         66 LV SV Index:   30 LVOT Area:      4.15 cm  LV Volumes (MOD) LV vol d, MOD A2C: 97.7 ml LV vol d, MOD A4C: 102.0 ml LV vol s, MOD A2C: 55.1 ml LV vol s, MOD A4C: 37.5 ml LV SV MOD A2C:     42.6 ml LV SV MOD A4C:     102.0 ml LV SV MOD BP:      51.5 ml RIGHT VENTRICLE RV S prime:     10.80 cm/s TAPSE (M-mode): 1.7 cm LEFT ATRIUM           Index        RIGHT ATRIUM           Index LA diam:      4.00 cm 1.81 cm/m   RA Area:     11.60 cm LA Vol (A2C): 43.6 ml 19.74 ml/m  RA Volume:   22.80 ml  10.32 ml/m LA Vol (A4C): 37.4 ml 16.94 ml/m  AORTIC VALVE             PULMONIC VALVE LVOT Vmax:   71.10 cm/s  PR End Diast Vel: 2.86 msec LVOT Vmean:  50.100 cm/s LVOT VTI:    0.160 m  AORTA Ao Root diam: 3.90 cm Ao Asc diam:  3.80 cm MITRAL VALVE               TRICUSPID VALVE MV Area (PHT): 2.80 cm    TR Peak grad:   1.5 mmHg MV Decel Time: 271 msec    TR Vmax:        61.90 cm/s MR Peak grad: 1.8 mmHg MR Vmax:      67.50 cm/s   SHUNTS MV E velocity: 54.00 cm/s  Systemic VTI:  0.16 m MV A velocity: 60.00 cm/s  Systemic Diam: 2.30 cm MV E/A ratio:  0.90 Landscape architect signed by Phineas Inches Signature Date/Time: 04/19/2022/10:56:19 AM    Final    DG Chest 2 View  Result Date: 04/18/2022 CLINICAL DATA:  Chest pain, cramping EXAM: CHEST - 2 VIEW COMPARISON:  06/16/2021, 12/11/2021 FINDINGS: Frontal and lateral views of the chest demonstrate a stable cardiac silhouette. There is chronic elevation of the left hemidiaphragm, with chronic left pleural thickening again noted. No airspace disease, effusion, or pneumothorax. No acute bony abnormalities. Chronic postsurgical or posttraumatic changes of the left fourth rib. IMPRESSION: 1. Stable chest, no acute process. Electronically Signed   By: Randa Ngo M.D.   On: 04/18/2022 21:11     LOS: 0 days   Antonieta Pert, MD [facility]  04/19/2022, 4:14 PM

## 2022-04-19 NOTE — Progress Notes (Signed)
Pt arrived to [facility] room 21 via stretcher from ED. Pt A&O x4, VSS, IV x1, on 2L Ward. Pt placed on continuous telemetry, standing weight obtained. Pt oriented to unit and room. Call bell given to pt. Belongings at bedside.

## 2022-04-19 NOTE — Progress Notes (Signed)
  Echocardiogram 2D Echocardiogram has been performed.  Wynelle Link 04/19/2022, 10:28 AM

## 2022-04-19 NOTE — ED Notes (Signed)
Pt in room A&O x4. Denies any current CP. Pt updated on plan of care for ECHO this morning. Pt receptive to information given. Room adjusted for comfort. Pt denies any other needs at this time.

## 2022-04-19 NOTE — Progress Notes (Signed)
Patient placed on CPAP with 2L O2 bled in at this time

## 2022-04-19 NOTE — Hospital Course (Addendum)
71 year old male with history of coronary artery disease status post PCI, CABG, CHF ( D), presented to the ED with sudden onset of nonexertional chest pain starting in the morning 11/29 and presented to the ED. pain described as his previous heart attack.  Also has progressive shortness of breath with 20 pound weight gain in the last month. Seen in the ED vitals stable,Heart score 5.  Troponin negative.No evidence of acute ischemia on lead 12 EKG. patient was admitted to rule out ACS
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
## 2022-04-20 ENCOUNTER — Other Ambulatory Visit: Payer: Self-pay

## 2022-04-20 ENCOUNTER — Ambulatory Visit (HOSPITAL_COMMUNITY)
Admission: EM | Disposition: A | Discharge status: Home / Self Care | Payer: Self-pay | Source: Home / Self Care | Attending: Emergency Medicine

## 2022-04-20 ENCOUNTER — Encounter (HOSPITAL_COMMUNITY): Payer: Self-pay | Admitting: Cardiovascular Disease

## 2022-04-20 DIAGNOSIS — I25110 Atherosclerotic heart disease of native coronary artery with unstable angina pectoris: Secondary | ICD-10-CM | POA: Diagnosis not present

## 2022-04-20 DIAGNOSIS — I25118 Atherosclerotic heart disease of native coronary artery with other forms of angina pectoris: Secondary | ICD-10-CM

## 2022-04-20 DIAGNOSIS — I200 Unstable angina: Secondary | ICD-10-CM | POA: Diagnosis not present

## 2022-04-20 HISTORY — PX: LEFT HEART CATH AND CORONARY ANGIOGRAPHY: CATH118249

## 2022-04-20 LAB — LIPID PANEL
Cholesterol: 136 mg/dL (ref 0–200)
HDL: 20 mg/dL — ABNORMAL LOW (ref >40)
LDL Cholesterol: 57 mg/dL (ref 0–99)
Total CHOL/HDL Ratio: 6.8 RATIO
Triglycerides: 294 mg/dL — ABNORMAL HIGH (ref <150)
VLDL: 59 mg/dL — ABNORMAL HIGH (ref 0–40)

## 2022-04-20 LAB — BASIC METABOLIC PANEL
Anion gap: 10 (ref 5–15)
Anion gap: 7 (ref 5–15)
BUN: 36 mg/dL — ABNORMAL HIGH (ref 8–23)
BUN: 33 mg/dL — ABNORMAL HIGH (ref 8–23)
CO2: 27 mmol/L (ref 22–32)
CO2: 25 mmol/L (ref 22–32)
Calcium: 9.1 mg/dL (ref 8.9–10.3)
Calcium: 8.7 mg/dL — ABNORMAL LOW (ref 8.9–10.3)
Chloride: 101 mmol/L (ref 98–111)
Chloride: 106 mmol/L (ref 98–111)
Creatinine, Ser: 1.61 mg/dL — ABNORMAL HIGH (ref 0.61–1.24)
Creatinine, Ser: 1.53 mg/dL — ABNORMAL HIGH (ref 0.61–1.24)
GFR, Estimated: 46 mL/min — ABNORMAL LOW (ref >60)
GFR, Estimated: 49 mL/min — ABNORMAL LOW (ref >60)
Glucose, Bld: 130 mg/dL — ABNORMAL HIGH (ref 70–99)
Glucose, Bld: 112 mg/dL — ABNORMAL HIGH (ref 70–99)
Potassium: 4.1 mmol/L (ref 3.5–5.1)
Potassium: 4.5 mmol/L (ref 3.5–5.1)
Sodium: 138 mmol/L (ref 135–145)
Sodium: 138 mmol/L (ref 135–145)

## 2022-04-20 LAB — TSH: TSH: 2.958 u[IU]/mL (ref 0.350–4.500)

## 2022-04-20 SURGERY — LEFT HEART CATH AND CORONARY ANGIOGRAPHY
Anesthesia: LOCAL

## 2022-04-20 MED ORDER — MIDAZOLAM HCL 2 MG/2ML IJ SOLN
INTRAMUSCULAR | Status: DC | PRN
  Administered 2022-04-20: 2 mg via INTRAVENOUS
  Administered 2022-04-20: 1 mg via INTRAVENOUS

## 2022-04-20 MED ORDER — SODIUM CHLORIDE 0.9 % IV SOLN: 250.0000 mL | INTRAVENOUS | Status: DC | PRN

## 2022-04-20 MED ORDER — HEPARIN SODIUM (PORCINE) 1000 UNIT/ML IJ SOLN
INTRAMUSCULAR | Status: AC
  Filled 2022-04-20: qty 10

## 2022-04-20 MED ORDER — SODIUM CHLORIDE 0.9 % IV SOLN: INTRAVENOUS | Status: DC

## 2022-04-20 MED ORDER — LIDOCAINE HCL (PF) 1 % IJ SOLN
INTRAMUSCULAR | Status: AC
  Filled 2022-04-20: qty 30

## 2022-04-20 MED ORDER — VERAPAMIL HCL 2.5 MG/ML IV SOLN
INTRAVENOUS | Status: AC
  Filled 2022-04-20: qty 2

## 2022-04-20 MED ORDER — LIDOCAINE HCL (PF) 1 % IJ SOLN
INTRAMUSCULAR | Status: DC | PRN
  Administered 2022-04-20: 2 mL
  Administered 2022-04-20: 12 mL

## 2022-04-20 MED ORDER — ENOXAPARIN SODIUM 40 MG/0.4ML IJ SOSY: 40.0000 mg | PREFILLED_SYRINGE | INTRAMUSCULAR | Status: DC

## 2022-04-20 MED ORDER — FENTANYL CITRATE (PF) 100 MCG/2ML IJ SOLN
INTRAMUSCULAR | Status: AC
  Filled 2022-04-20: qty 2

## 2022-04-20 MED ORDER — HYDRALAZINE HCL 20 MG/ML IJ SOLN: 10.0000 mg | INTRAMUSCULAR | Status: DC | PRN

## 2022-04-20 MED ORDER — HEPARIN (PORCINE) IN NACL 1000-0.9 UT/500ML-% IV SOLN
INTRAVENOUS | Status: AC
  Filled 2022-04-20: qty 500

## 2022-04-20 MED ORDER — IOHEXOL 350 MG/ML SOLN
INTRAVENOUS | Status: DC | PRN
  Administered 2022-04-20: 35 mL

## 2022-04-20 MED ORDER — MIDAZOLAM HCL 2 MG/2ML IJ SOLN
INTRAMUSCULAR | Status: AC
  Filled 2022-04-20: qty 2

## 2022-04-20 MED ORDER — FENTANYL CITRATE (PF) 100 MCG/2ML IJ SOLN
INTRAMUSCULAR | Status: DC | PRN
  Administered 2022-04-20: 25 ug via INTRAVENOUS
  Administered 2022-04-20: 50 ug via INTRAVENOUS

## 2022-04-20 MED ORDER — ISOSORBIDE MONONITRATE ER 30 MG PO TB24: 30.0000 mg | ORAL_TABLET | Freq: Every day | ORAL | 0 refills | Status: AC

## 2022-04-20 MED ORDER — LABETALOL HCL 5 MG/ML IV SOLN: 10.0000 mg | INTRAVENOUS | Status: DC | PRN

## 2022-04-20 MED ORDER — SODIUM CHLORIDE 0.9% FLUSH: 3.0000 mL | Freq: Two times a day (BID) | INTRAVENOUS | Status: DC

## 2022-04-20 MED ORDER — HEPARIN (PORCINE) IN NACL 1000-0.9 UT/500ML-% IV SOLN
INTRAVENOUS | Status: DC | PRN
  Administered 2022-04-20 (×2): 500 mL

## 2022-04-20 MED ORDER — SODIUM CHLORIDE 0.9% FLUSH: 3.0000 mL | INTRAVENOUS | Status: DC | PRN

## 2022-04-20 SURGICAL SUPPLY — 15 items
CATH 5FR JL3.5 JR4 ANG PIG MP (CATHETERS) IMPLANT
CATH INFINITI 5FR JL4 (CATHETERS) IMPLANT
CLOSURE MYNX CONTROL 5F (Vascular Products) IMPLANT
GLIDESHEATH SLEND SS 6F .021 (SHEATH) IMPLANT
GUIDEWIRE INQWIRE 1.5J.035X260 (WIRE) IMPLANT
INQWIRE 1.5J .035X260CM (WIRE) ×1
KIT HEART LEFT (KITS) ×1 IMPLANT
KIT MICROPUNCTURE NIT STIFF (SHEATH) IMPLANT
PACK CARDIAC CATHETERIZATION (CUSTOM PROCEDURE TRAY) ×1 IMPLANT
SHEATH PINNACLE 5F 10CM (SHEATH) IMPLANT
SHEATH PROBE COVER 6X72 (BAG) IMPLANT
TRANSDUCER W/STOPCOCK (MISCELLANEOUS) ×1 IMPLANT
TUBING CIL FLEX 10 FLL-RA (TUBING) ×1 IMPLANT
WIRE EMERALD 3MM-J .035X150CM (WIRE) IMPLANT
WIRE EMERALD 3MM-J .035X260CM (WIRE) IMPLANT

## 2022-04-20 NOTE — Progress Notes (Signed)
   04/20/22 1100  Mobility  Activity Ambulated with assistance in hallway  Level of Assistance Contact guard assist, steadying assist  Assistive Device Front wheel walker  Distance Ambulated (ft) 420 ft  Activity Response Tolerated well  Mobility Referral Yes  $Mobility charge 1 Mobility   Mobility Specialist Progress Note  Pt was in bed and agreeable. X2 standing breaks d/t SOB. Returned to bed w/ all needs met and call bell in reach.   Lucious Groves Mobility Specialist  Please contact via SecureChat or Rehab office at [phone]
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
## 2022-04-20 NOTE — TOC Transition Note (Signed)
Transition of Care [facility]) - CM/SW Discharge Note   Patient Details  Name: Randall Montoya MRN: [id number] Date of Birth: 11-03-1950  Transition of Care [facility] CM/SW Contact:  Zenon Mayo, RN Phone Number: 04/20/2022, 3:01 PM   Clinical Narrative:    Patient is for dc today, patient states he needs a rollator.  Per MD will order.  NCM offered choice, he states he usually uses [employer]. NCM made referral to [employer] thru [facility].  The rollator will be delivered to patient room.  NCM will provide him a bus pass for transport home.     Final next level of care: Home/Self Care Barriers to Discharge: No Barriers Identified   Patient Goals and CMS Choice Patient states their goals for this hospitalization and ongoing recovery are:: return home CMS Medicare.gov Compare Post Acute Care list provided to:: Patient Choice offered to / list presented to : Patient  Discharge Placement                       Discharge Plan and Services                DME Arranged: Walker rolling with seat DME Agency: [employer] Date DME Agency Contacted: 04/20/22 Time DME Agency Contacted: 1501 Representative spoke with at DME Agency: [employer] HH Arranged: NA          Social Determinants of Health ([place]) Interventions     Readmission Risk Interventions     No data to display

## 2022-04-20 NOTE — Interval H&P Note (Signed)
History and Physical Interval Note:  04/20/2022 12:29 PM  Randall Montoya  has presented today for surgery, with the diagnosis of progressive angina.  The various methods of treatment have been discussed with the patient and family. After consideration of risks, benefits and other options for treatment, the patient has consented to  Procedure(s): LEFT HEART CATH AND CORONARY ANGIOGRAPHY (N/A) as a surgical intervention.  The patient's history has been reviewed, patient examined, no change in status, stable for surgery.  I have reviewed the patient's chart and labs.  Questions were answered to the patient's satisfaction.    Cath Lab Visit (complete for each Cath Lab visit)  Clinical Evaluation Leading to the Procedure:   ACS: No.  Non-ACS:    Anginal Classification: CCS III  Anti-ischemic medical therapy: Minimal Therapy (1 class of medications)  Non-Invasive Test Results: No non-invasive testing performed  Prior CABG: No previous CABG        Lauree Chandler

## 2022-04-20 NOTE — Progress Notes (Signed)
Pt returned to unit from the cath lab.  Upon return he was awake, A&O x 4, Rt groin assessed, same covered with a dry dressing, which was clean, dry and intact.  Pt instructed  to remain lying flat.  Call light in reach.
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
## 2022-04-20 NOTE — Progress Notes (Signed)
Patient is discharged and discharge instructions revised with patient.  He will be going home via taxi with voucher.  O2 was delivered to room, pt has O2 at home, Groin assessed and dressing remains clean dray and intact.

## 2022-04-20 NOTE — Progress Notes (Addendum)
PROGRESS NOTE Randall Montoya  WUJ:[id number] DOB: 1951-02-11 DOA: 04/18/2022 PCP: Mardi Mainland, FNP   Brief Narrative/Hospital Course: 71 year old male with history of coronary artery disease status post PCI, CABG, CHF ( D), presented to the ED with sudden onset of nonexertional chest pain starting in the morning 11/29 and presented to the ED. pain described as his previous heart attack.  Also has progressive shortness of breath with 20 pound weight gain in the last month. Seen in the ED vitals stable,Heart score 5.  Troponin negative.No evidence of acute ischemia on lead 12 EKG. patient was admitted to rule out ACS    Subjective:  Seen and examined this morning. Endorses mild left-sided chest pain/discomfort Afebrile overnight BP stable creatinine further downtrending at 1.5  LDL 57  Has shortness of breath which is chronic Assessment and Plan: Principal Problem:   Chest pain   Unstable angina CAD with hx of  CABG and PCI: Presenting with chest pain concerning for unstable angina, serial troponins negative. TTE w/ lvef 55-60%, G1DD,No RWMA.  Appreciate cardiology input on board, waiting for cardiac cath today.  Continue with aspirin, Plavix, Lipitor, beta-blocker Imdur Underwent cardiac cath informed by cardiology that he has a stable and he can be discharged later today.  AKI on CKD stage IIIa: Creatinine 1.8 on admission now down trended to 1.5 continue IV fluids as per cardiology for precath hydration monitor renal function post cath. Recent Labs    06/20/21 0437 08/29/21 1136 09/06/21 0140 09/08/21 1118 09/09/21 0103 09/10/21 0647 04/18/22 2050 04/19/22 0611 04/20/22 0100 04/20/22 0650  BUN 64* 39* 26* 61* 74* 67* 41* 42* 36* 33*  CREATININE 1.56* 1.45* 1.48* 2.54* 2.48* 1.45* 1.82* 1.67* 1.61* 1.53*     Chronic diastolic CHF :volume status stable, On Gentle IV Fluids for cath. Essential hypertension: BP controlled, holding ARB and diuretics.  Continue meds as  #1 Hyperlipidemia: On Lipitor Polyneuropathy stable GERD stable Class II Obesity:Patient's Body mass index is 36.45 kg/m. : Will benefit with PCP follow-up, weight loss  healthy lifestyle and outpatient sleep evaluation.   DVT prophylaxis: enoxaparin (LOVENOX) injection 40 mg Start: 04/19/22 0800 Code Status:   Code Status: Full Code Family Communication: plan of care discussed with patient at bedside. Patient status is: Admitted as observation remains hospitalized for further cardiac work-up cardiac cath renal failure  Level of care: Telemetry Cardiac  Dispo: The patient is from: HOME            Anticipated disposition: Pending cardiac cath  Objective: Vitals last 24 hrs: Vitals:   04/20/22 0026 04/20/22 0429 04/20/22 0752 04/20/22 0854  BP:  (!) 108/48 (!) 109/50   Pulse:  (!) 58 61   Resp: '20 15 19   '$ Temp:  97.8 F (36.6 C) 97.8 F (36.6 C)   TempSrc:  Oral Oral   SpO2:  98% 92% 94%  Weight:      Height:       Weight change: 0.537 kg  Physical Examination: General exam: AAox3, weak,older appearing HEENT:Oral mucosa moist, Ear/Nose WNL grossly, dentition normal. Respiratory system: bilaterally diminished BS, no use of accessory muscle Cardiovascular system: S1 & S2 +, regular rate, JVD neg. Gastrointestinal system: Abdomen soft, NT,ND,BS+ Nervous System:Alert, awake, moving extremities and grossly nonfocal Extremities: LE ankle edema neg, lower extremities warm Skin: No rashes,no icterus. MSK: Normal muscle bulk,tone, power   Medications reviewed: Scheduled Meds:  aspirin  324 mg Oral Once   aspirin EC  81 mg Oral Daily  atorvastatin  80 mg Oral Daily   bisoprolol  10 mg Oral Daily   cholecalciferol  1,000 Units Oral Daily   clopidogrel  75 mg Oral Daily   colchicine  0.6 mg Oral Daily   enoxaparin (LOVENOX) injection  40 mg Subcutaneous Q24H   fluticasone furoate-vilanterol  1 puff Inhalation Daily   And   umeclidinium bromide  1 puff Inhalation Daily    gabapentin  100 mg Oral TID   isosorbide mononitrate  30 mg Oral Daily   oxybutynin  5 mg Oral Daily   pantoprazole  40 mg Oral Daily   sodium chloride flush  3 mL Intravenous Q12H   tamsulosin  0.4 mg Oral Daily   Continuous Infusions:  sodium chloride     sodium chloride      Diet Order             Diet NPO time specified Except for: Sips with Meds  Diet effective midnight                   Intake/Output Summary (Last 24 hours) at 04/20/2022 0858 Last data filed at 04/20/2022 0852 Gross per 24 hour  Intake 401.38 ml  Output 450 ml  Net -48.62 ml   Net IO Since Admission: -48.62 mL [04/20/22 0858]  Wt Readings from Last 3 Encounters:  04/20/22 108.7 kg  03/15/22 108.4 kg  01/25/22 102.1 kg     Unresulted Labs (From admission, onward)     Start     Ordered   04/25/22 0500  Creatinine, serum  (enoxaparin (LOVENOX)    CrCl >/= 30 ml/min)  Weekly,   R     Comments: while on enoxaparin therapy    04/18/22 2256          Data Reviewed: I have personally reviewed following labs and imaging studies CBC: Recent Labs  Lab 04/18/22 2050 04/19/22 0611  WBC 7.0 6.4  HGB 14.6 14.1  HCT 44.8 43.3  MCV 89.1 89.6  PLT 211 035    Basic Metabolic Panel: Recent Labs  Lab 04/18/22 2050 04/19/22 0611 04/20/22 0100 04/20/22 0650  NA 139 141 138 138  K 4.1 4.3 4.1 4.5  CL 103 103 101 106  CO2 '24 27 27 25  '$ GLUCOSE 107* 118* 130* 112*  BUN 41* 42* 36* 33*  CREATININE 1.82* 1.67* 1.61* 1.53*  CALCIUM 9.4 9.2 9.1 8.7*  MG  --  2.0  --   --   PHOS  --  3.8  --   --     GFR: Estimated Creatinine Clearance: 53.7 mL/min (A) (by C-G formula based on SCr of 1.53 mg/dL (H)). Liver Function Tests: Recent Labs  Lab 04/19/22 0611  ALBUMIN 3.8    No results found for this or any previous visit (from the past 240 hour(s)).  Antimicrobials: Anti-infectives (From admission, onward)    None      Culture/Microbiology No results found for: "SDES", "SPECREQUEST",  "CULT", "REPTSTATUS"  Radiology Studies: ECHOCARDIOGRAM COMPLETE  Result Date: 04/19/2022    ECHOCARDIOGRAM REPORT   Patient Name:   Randall Montoya Date of Exam: 04/19/2022 Medical Rec #:  [id number]      Height:       68.0 in Accession #:    [id number]     Weight:       240.0 lb Date of Birth:  07-03-1950      BSA:          2.208 m Patient Age:  71 years       BP:           127/70 mmHg Patient Gender: M              HR:           57 bpm. Exam Location:  Inpatient Procedure: 2D Echo, Cardiac Doppler, Color Doppler and Intracardiac            Opacification Agent Indications:    Chest Pain R07.9  History:        Patient has prior history of Echocardiogram examinations, most                 recent 05/05/2021. CHF, CAD and Previous Myocardial Infarction,                 Prior CABG, Signs/Symptoms:Chest Pain and Dyspnea; Risk                 Factors:Hypertension, Dyslipidemia, Former Smoker and Sleep                 Apnea.  Sonographer:    Greer Pickerel Referring Phys: [id number] [place]  Sonographer Comments: Suboptimal subcostal window and suboptimal apical window. Image acquisition challenging due to COPD, Image acquisition challenging due to respiratory motion and Image acquisition challenging due to patient body habitus. IMPRESSIONS  1. Left ventricular ejection fraction, by estimation, is 55 to 60%. The left ventricle has normal function. The left ventricle has no regional wall motion abnormalities. Left ventricular diastolic parameters are consistent with Grade I diastolic dysfunction (impaired relaxation).  2. Right ventricular systolic function is normal. The right ventricular size is normal.  3. No evidence of mitral valve regurgitation.  4. The aortic valve was not well visualized. Aortic valve regurgitation is not visualized.  5. There is mild dilatation of the ascending aorta, measuring 38 mm. Comparison(s): No significant change from prior study. FINDINGS  Left Ventricle: Left ventricular ejection  fraction, by estimation, is 55 to 60%. The left ventricle has normal function. The left ventricle has no regional wall motion abnormalities. Definity contrast agent was given IV to delineate the left ventricular  endocardial borders. The left ventricular internal cavity size was normal in size. There is no left ventricular hypertrophy. Abnormal (paradoxical) septal motion consistent with post-operative status. Left ventricular diastolic parameters are consistent  with Grade I diastolic dysfunction (impaired relaxation). Right Ventricle: The right ventricular size is normal. Right ventricular systolic function is normal. Left Atrium: Left atrial size was normal in size. Right Atrium: Right atrial size was normal in size. Pericardium: There is no evidence of pericardial effusion. Mitral Valve: No evidence of mitral valve regurgitation. Tricuspid Valve: Tricuspid valve regurgitation is not demonstrated. Aortic Valve: The aortic valve was not well visualized. Aortic valve regurgitation is not visualized. Pulmonic Valve: Pulmonic valve regurgitation is not visualized. Aorta: There is mild dilatation of the ascending aorta, measuring 38 mm. IAS/Shunts: The interatrial septum was not well visualized.  LEFT VENTRICLE PLAX 2D LVIDd:         4.40 cm      Diastology LVIDs:         3.40 cm      LV e' medial:    5.22 cm/s LV PW:         1.50 cm      LV E/e' medial:  10.3 LV IVS:        1.00 cm      LV e' lateral:   7.40  cm/s LVOT diam:     2.30 cm      LV E/e' lateral: 7.3 LV SV:         66 LV SV Index:   30 LVOT Area:     4.15 cm  LV Volumes (MOD) LV vol d, MOD A2C: 97.7 ml LV vol d, MOD A4C: 102.0 ml LV vol s, MOD A2C: 55.1 ml LV vol s, MOD A4C: 37.5 ml LV SV MOD A2C:     42.6 ml LV SV MOD A4C:     102.0 ml LV SV MOD BP:      51.5 ml RIGHT VENTRICLE RV S prime:     10.80 cm/s TAPSE (M-mode): 1.7 cm LEFT ATRIUM           Index        RIGHT ATRIUM           Index LA diam:      4.00 cm 1.81 cm/m   RA Area:     11.60 cm LA Vol  (A2C): 43.6 ml 19.74 ml/m  RA Volume:   22.80 ml  10.32 ml/m LA Vol (A4C): 37.4 ml 16.94 ml/m  AORTIC VALVE             PULMONIC VALVE LVOT Vmax:   71.10 cm/s  PR End Diast Vel: 2.86 msec LVOT Vmean:  50.100 cm/s LVOT VTI:    0.160 m  AORTA Ao Root diam: 3.90 cm Ao Asc diam:  3.80 cm MITRAL VALVE               TRICUSPID VALVE MV Area (PHT): 2.80 cm    TR Peak grad:   1.5 mmHg MV Decel Time: 271 msec    TR Vmax:        61.90 cm/s MR Peak grad: 1.8 mmHg MR Vmax:      67.50 cm/s   SHUNTS MV E velocity: 54.00 cm/s  Systemic VTI:  0.16 m MV A velocity: 60.00 cm/s  Systemic Diam: 2.30 cm MV E/A ratio:  0.90 Landscape architect signed by Phineas Inches Signature Date/Time: 04/19/2022/10:56:19 AM    Final    DG Chest 2 View  Result Date: 04/18/2022 CLINICAL DATA:  Chest pain, cramping EXAM: CHEST - 2 VIEW COMPARISON:  06/16/2021, 12/11/2021 FINDINGS: Frontal and lateral views of the chest demonstrate a stable cardiac silhouette. There is chronic elevation of the left hemidiaphragm, with chronic left pleural thickening again noted. No airspace disease, effusion, or pneumothorax. No acute bony abnormalities. Chronic postsurgical or posttraumatic changes of the left fourth rib. IMPRESSION: 1. Stable chest, no acute process. Electronically Signed   By: Randa Ngo M.D.   On: 04/18/2022 21:11     LOS: 0 days   Antonieta Pert, MD [facility]  04/20/2022, 8:58 AM

## 2022-04-20 NOTE — Discharge Summary (Signed)
Physician Discharge Summary  Randall Montoya [id number]:[id number] DOB: 04-28-1951 DOA: 04/18/2022  PCP: Mardi Mainland, FNP  Admit date: 04/18/2022 Discharge date: 04/20/2022 Recommendations for Outpatient Follow-up:  Follow up with PCP in 1 weeks-call for appointment Please obtain BMP/CBC in one week  Discharge Dispo: home Discharge Condition: Stable Code Status:   Code Status: Full Code Diet recommendation:  Diet Order             Diet Heart Room service appropriate? Yes; Fluid consistency: Thin  Diet effective now                    Brief/Interim Summary: 71 year old male with history of coronary artery disease status post PCI, CABG, CHF ( D), presented to the ED with sudden onset of nonexertional chest pain starting in the morning 11/29 and presented to the ED. pain described as his previous heart attack.  Also has progressive shortness of breath with 20 pound weight gain in the last month. Seen in the ED vitals stable,Heart score 5.  Troponin negative.No evidence of acute ischemia on lead 12 EKG. patient was admitted to rule out ACS  Seen by cardiology chest pain concerning for unstable angina, underwent cardiac cath informed by cardiology that he has a stable and he can be discharged later today. Discharge Diagnoses:  Principal Problem:   Chest pain  Unstable angina CAD with hx of  CABG and PCI: Presenting with chest pain concerning for unstable angina, serial troponins negative. TTE w/ lvef 55-60%, G1DD,No RWMA.  Appreciate cardiology input on board.Continue with aspirin, Plavix, Lipitor, beta-blocker Imdur Underwent cardiac cath informed by cardiology that he has a stable and he can be discharged later today.   AKI on CKD stage IIIa: Creatinine 1.8 on admission now down trended to 1.5 continue IV fluids as per cardiology for precath hydration monitor renal function post cath. Recent Labs  Lab 04/18/22 2050 04/19/22 0611 04/20/22 0100 04/20/22 0650  BUN 41* 42* 36*  33*  CREATININE 1.82* 1.67* 1.61* 1.53*    Chronic diastolic CHF :volume status stable s/p Gentle IV Fluids for cath. Essential hypertension: BP controlled, holding ARB and diuretics while here. Hyperlipidemia: On Lipitor Polyneuropathy stable GERD stable Class II Obesity:Patient's Body mass index is 36.45 kg/m. : Will benefit with PCP follow-up, weight loss  healthy lifestyle and outpatient sleep evaluation.  Consults: Cardiology  Subjective: ***  Discharge Exam: Vitals:   04/20/22 1322 04/20/22 1325  BP: 122/66 (!) 154/64  Pulse: 64 (!) 59  Resp: 17 17  Temp:  97.6 F (36.4 C)  SpO2: 96% 97%   General: Pt is alert, awake, not in acute distress Cardiovascular: RRR, S1/S2 +, no rubs, no gallops Respiratory: CTA bilaterally, no wheezing, no rhonchi Abdominal: Soft, NT, ND, bowel sounds + Extremities: no edema, no cyanosis  Discharge Instructions   Allergies as of 04/20/2022   No Known Allergies   Med Rec must be completed prior to using this [facility]***       Follow-up Information     Mardi Mainland, FNP Follow up in 1 week(s).   Specialty: Nurse Practitioner Contact information: [address] [place] [address] [phone]                No Known Allergies  The results of significant diagnostics from this hospitalization (including imaging, microbiology, ancillary and laboratory) are listed below for reference.    Microbiology: No results found for this or any previous visit (from the past 240 hour(s)).  Procedures/Studies: CARDIAC CATHETERIZATION  Result Date: 04/20/2022   Colon Flattery Cx to Prox Cx lesion is 30% stenosed.   Dist LM to Prox LAD lesion is 30% stenosed.   Mid LAD lesion is 30% stenosed.   Mid LM to Dist LM lesion is 30% stenosed.   Ost RCA to Dist RCA lesion is 20% stenosed. Mild non-obstructive disease in the proximal LAD, mid LAD, proximal Circumflex. Patent stents in the proximal/mid and distal RCA with minimal  restenosis. LVEDP 10-16 mmHg. Recommendations: Continue medical management of CAD.   ECHOCARDIOGRAM COMPLETE  Result Date: 04/19/2022    ECHOCARDIOGRAM REPORT   Patient Name:   Randall Montoya Date of Exam: 04/19/2022 Medical Rec #:  [id number]      Height:       68.0 in Accession #:    [id number]     Weight:       240.0 lb Date of Birth:  January 25, 1951      BSA:          2.208 m Patient Age:    71 years       BP:           127/70 mmHg Patient Gender: M              HR:           57 bpm. Exam Location:  Inpatient Procedure: 2D Echo, Cardiac Doppler, Color Doppler and Intracardiac            Opacification Agent Indications:    Chest Pain R07.9  History:        Patient has prior history of Echocardiogram examinations, most                 recent 05/05/2021. CHF, CAD and Previous Myocardial Infarction,                 Prior CABG, Signs/Symptoms:Chest Pain and Dyspnea; Risk                 Factors:Hypertension, Dyslipidemia, Former Smoker and Sleep                 Apnea.  Sonographer:    Greer Pickerel Referring Phys: [id number] [place]  Sonographer Comments: Suboptimal subcostal window and suboptimal apical window. Image acquisition challenging due to COPD, Image acquisition challenging due to respiratory motion and Image acquisition challenging due to patient body habitus. IMPRESSIONS  1. Left ventricular ejection fraction, by estimation, is 55 to 60%. The left ventricle has normal function. The left ventricle has no regional wall motion abnormalities. Left ventricular diastolic parameters are consistent with Grade I diastolic dysfunction (impaired relaxation).  2. Right ventricular systolic function is normal. The right ventricular size is normal.  3. No evidence of mitral valve regurgitation.  4. The aortic valve was not well visualized. Aortic valve regurgitation is not visualized.  5. There is mild dilatation of the ascending aorta, measuring 38 mm. Comparison(s): No significant change from prior study. FINDINGS   Left Ventricle: Left ventricular ejection fraction, by estimation, is 55 to 60%. The left ventricle has normal function. The left ventricle has no regional wall motion abnormalities. Definity contrast agent was given IV to delineate the left ventricular  endocardial borders. The left ventricular internal cavity size was normal in size. There is no left ventricular hypertrophy. Abnormal (paradoxical) septal motion consistent with post-operative status. Left ventricular diastolic parameters are consistent  with Grade I diastolic dysfunction (impaired relaxation). Right Ventricle: The right ventricular size is  normal. Right ventricular systolic function is normal. Left Atrium: Left atrial size was normal in size. Right Atrium: Right atrial size was normal in size. Pericardium: There is no evidence of pericardial effusion. Mitral Valve: No evidence of mitral valve regurgitation. Tricuspid Valve: Tricuspid valve regurgitation is not demonstrated. Aortic Valve: The aortic valve was not well visualized. Aortic valve regurgitation is not visualized. Pulmonic Valve: Pulmonic valve regurgitation is not visualized. Aorta: There is mild dilatation of the ascending aorta, measuring 38 mm. IAS/Shunts: The interatrial septum was not well visualized.  LEFT VENTRICLE PLAX 2D LVIDd:         4.40 cm      Diastology LVIDs:         3.40 cm      LV e' medial:    5.22 cm/s LV PW:         1.50 cm      LV E/e' medial:  10.3 LV IVS:        1.00 cm      LV e' lateral:   7.40 cm/s LVOT diam:     2.30 cm      LV E/e' lateral: 7.3 LV SV:         66 LV SV Index:   30 LVOT Area:     4.15 cm  LV Volumes (MOD) LV vol d, MOD A2C: 97.7 ml LV vol d, MOD A4C: 102.0 ml LV vol s, MOD A2C: 55.1 ml LV vol s, MOD A4C: 37.5 ml LV SV MOD A2C:     42.6 ml LV SV MOD A4C:     102.0 ml LV SV MOD BP:      51.5 ml RIGHT VENTRICLE RV S prime:     10.80 cm/s TAPSE (M-mode): 1.7 cm LEFT ATRIUM           Index        RIGHT ATRIUM           Index LA diam:      4.00 cm  1.81 cm/m   RA Area:     11.60 cm LA Vol (A2C): 43.6 ml 19.74 ml/m  RA Volume:   22.80 ml  10.32 ml/m LA Vol (A4C): 37.4 ml 16.94 ml/m  AORTIC VALVE             PULMONIC VALVE LVOT Vmax:   71.10 cm/s  PR End Diast Vel: 2.86 msec LVOT Vmean:  50.100 cm/s LVOT VTI:    0.160 m  AORTA Ao Root diam: 3.90 cm Ao Asc diam:  3.80 cm MITRAL VALVE               TRICUSPID VALVE MV Area (PHT): 2.80 cm    TR Peak grad:   1.5 mmHg MV Decel Time: 271 msec    TR Vmax:        61.90 cm/s MR Peak grad: 1.8 mmHg MR Vmax:      67.50 cm/s   SHUNTS MV E velocity: 54.00 cm/s  Systemic VTI:  0.16 m MV A velocity: 60.00 cm/s  Systemic Diam: 2.30 cm MV E/A ratio:  0.90 Landscape architect signed by Phineas Inches Signature Date/Time: 04/19/2022/10:56:19 AM    Final    DG Chest 2 View  Result Date: 04/18/2022 CLINICAL DATA:  Chest pain, cramping EXAM: CHEST - 2 VIEW COMPARISON:  06/16/2021, 12/11/2021 FINDINGS: Frontal and lateral views of the chest demonstrate a stable cardiac silhouette. There is chronic elevation of the left hemidiaphragm, with chronic left pleural thickening again  noted. No airspace disease, effusion, or pneumothorax. No acute bony abnormalities. Chronic postsurgical or posttraumatic changes of the left fourth rib. IMPRESSION: 1. Stable chest, no acute process. Electronically Signed   By: Randa Ngo M.D.   On: 04/18/2022 21:11    Labs: BNP (last 3 results) Recent Labs    05/04/21 1355 06/16/21 1829 04/18/22 2140  BNP 12.1 65.8 8.3   Basic Metabolic Panel: Recent Labs  Lab 04/18/22 2050 04/19/22 0611 04/20/22 0100 04/20/22 0650  NA 139 141 138 138  K 4.1 4.3 4.1 4.5  CL 103 103 101 106  CO2 '24 27 27 25  '$ GLUCOSE 107* 118* 130* 112*  BUN 41* 42* 36* 33*  CREATININE 1.82* 1.67* 1.61* 1.53*  CALCIUM 9.4 9.2 9.1 8.7*  MG  --  2.0  --   --   PHOS  --  3.8  --   --    Liver Function Tests: Recent Labs  Lab 04/19/22 0611  ALBUMIN 3.8   No results for input(s): "LIPASE", "AMYLASE"  in the last 168 hours. No results for input(s): "AMMONIA" in the last 168 hours. CBC: Recent Labs  Lab 04/18/22 2050 04/19/22 0611  WBC 7.0 6.4  HGB 14.6 14.1  HCT 44.8 43.3  MCV 89.1 89.6  PLT 211 194   Cardiac Enzymes: No results for input(s): "CKTOTAL", "CKMB", "CKMBINDEX", "TROPONINI" in the last 168 hours. BNP: Invalid input(s): "POCBNP" CBG: No results for input(s): "GLUCAP" in the last 168 hours. D-Dimer No results for input(s): "DDIMER" in the last 72 hours. Hgb A1c No results for input(s): "HGBA1C" in the last 72 hours. Lipid Profile Recent Labs    04/20/22 0100  CHOL 136  HDL 20*  LDLCALC 57  TRIG 294*  CHOLHDL 6.8   Thyroid function studies Recent Labs    04/20/22 0100  TSH 2.958   Anemia work up No results for input(s): "VITAMINB12", "FOLATE", "FERRITIN", "TIBC", "IRON", "RETICCTPCT" in the last 72 hours. Urinalysis    Component Value Date/Time   COLORURINE YELLOW 06/17/2021 1025   APPEARANCEUR CLEAR 06/17/2021 1025   LABSPEC 1.028 06/17/2021 1025   PHURINE 5.0 06/17/2021 1025   GLUCOSEU NEGATIVE 06/17/2021 1025   HGBUR NEGATIVE 06/17/2021 1025   BILIRUBINUR NEGATIVE 06/17/2021 1025   [place] 06/17/2021 1025   PROTEINUR NEGATIVE 06/17/2021 1025   NITRITE NEGATIVE 06/17/2021 1025   [place] 06/17/2021 1025   Sepsis Labs Recent Labs  Lab 04/18/22 2050 04/19/22 0611  WBC 7.0 6.4   Microbiology No results found for this or any previous visit (from the past 240 hour(s)). Time coordinating discharge: 25 minutes  SIGNED: Antonieta Pert, MD  [facility] 04/20/2022, 1:37 PM  If 7PM-7AM, please contact night-coverage [email or web address]

## 2022-04-20 NOTE — Progress Notes (Addendum)
04/20/2022  Randall Montoya DOB: [birth year] MRN: [id number]   RIDER WAIVER AND RELEASE OF LIABILITY  For the purposes of helping with transportation needs, [facility] partners with outside transportation providers (taxi companies, [place], Social research officer, government.) to give [employer] patients or other approved people the choice of on-demand rides [employer]") to our buildings for non-emergency visits.  By using [employer], I, the person signing this document, on behalf of myself and/or any legal minors (in my care using the [employer]), agree:  Government social research officer given to me are supplied by independent, outside transportation providers who do not work for, or have any affiliation with, [employer]. [place] is not a transportation company. [place] has no control over the quality or safety of the rides I get using [employer]. [place] has no control over whether any outside ride will happen on time or not. [facility] gives no guarantee on the reliability, quality, safety, or availability on any rides, or that no mistakes will happen. I know and accept that traveling by vehicle (car, truck, SVU, Lucianne Lei, bus, taxi, etc.) has risks of serious injuries such as disability, being paralyzed, and death. I know and agree the risk of using [employer] is mine alone, and not [employer]. Transport Services are provided "as is" and as are available. The transportation providers are in charge for all inspections and care of the vehicles used to provide these rides. I agree not to take legal action against [facility], its agents, employees, officers, directors, representatives, insurers, attorneys, assigns, successors, subsidiaries, and affiliates at any time for any reasons related directly or indirectly to using [employer]. I also agree not to take legal action against  or its affiliates for any injury, death, or damage to property caused by or related to using  [employer]. I have read this Waiver and Release of Liability, and I understand the terms used in it and their legal meaning. This Waiver is freely and voluntarily given with the understanding that my right (or any legal minors) to legal action against [place] relating to [employer] is knowingly given up to use these services.   I attest that I read the Ride Waiver and Release of Liability to Randall Montoya, gave [name] the opportunity to ask questions and answered the questions asked (if any). I affirm that Randall Montoya then provided consent for assistance with transportation.    Taxi voucher given to RN for hospital DC transport to pt's home address listed in chart.

## 2022-04-20 NOTE — Progress Notes (Signed)
Left heart catheterization with patent RCA stents.  No obstructive disease identified.  Right radial cath site clean and dry.  2+ pulse.  Right femoral cath site was pursued due to tortuosity.  Minx closure device used.  2+ pulse noted.  No edema.  Overall, the patient is stable for discharge today.  I have added Imdur to his antianginal regimen.  He still may have small vessel disease.  He will follow with Dr. Edson Snowball at [facility]  Cardiology will sign off.   [place] T. Audie Box, MD, [place]  [address] [place], [place] [address] [phone]  3:32 PM

## 2022-04-24 MED FILL — Verapamil HCl IV Soln 2.5 MG/ML: INTRAVENOUS | Qty: 2 | Status: AC

## 2022-04-24 MED FILL — Heparin Sodium (Porcine) Inj 1000 Unit/ML: INTRAMUSCULAR | Qty: 10 | Status: AC

## 2022-08-03 ENCOUNTER — Other Ambulatory Visit: Payer: Self-pay

## 2022-08-03 ENCOUNTER — Emergency Department (HOSPITAL_COMMUNITY)
Admission: EM | Admit: 2022-08-03 | Discharge: 2022-08-04 | Disposition: A | Discharge status: Home / Self Care | Payer: 59 | Attending: Emergency Medicine | Admitting: Emergency Medicine

## 2022-08-03 ENCOUNTER — Encounter (HOSPITAL_COMMUNITY): Payer: Self-pay | Admitting: Emergency Medicine

## 2022-08-03 ENCOUNTER — Emergency Department (HOSPITAL_COMMUNITY): Payer: 59

## 2022-08-03 DIAGNOSIS — I509 Heart failure, unspecified: Secondary | ICD-10-CM | POA: Insufficient documentation

## 2022-08-03 DIAGNOSIS — N189 Chronic kidney disease, unspecified: Secondary | ICD-10-CM | POA: Diagnosis not present

## 2022-08-03 DIAGNOSIS — R0602 Shortness of breath: Secondary | ICD-10-CM | POA: Diagnosis present

## 2022-08-03 DIAGNOSIS — I2510 Atherosclerotic heart disease of native coronary artery without angina pectoris: Secondary | ICD-10-CM | POA: Insufficient documentation

## 2022-08-03 DIAGNOSIS — Z20822 Contact with and (suspected) exposure to covid-19: Secondary | ICD-10-CM | POA: Insufficient documentation

## 2022-08-03 DIAGNOSIS — Z7902 Long term (current) use of antithrombotics/antiplatelets: Secondary | ICD-10-CM | POA: Diagnosis not present

## 2022-08-03 DIAGNOSIS — Z7982 Long term (current) use of aspirin: Secondary | ICD-10-CM | POA: Diagnosis not present

## 2022-08-03 DIAGNOSIS — Z7951 Long term (current) use of inhaled steroids: Secondary | ICD-10-CM | POA: Diagnosis not present

## 2022-08-03 DIAGNOSIS — J449 Chronic obstructive pulmonary disease, unspecified: Secondary | ICD-10-CM | POA: Diagnosis not present

## 2022-08-03 LAB — BRAIN NATRIURETIC PEPTIDE: B Natriuretic Peptide: 342.8 pg/mL — ABNORMAL HIGH (ref 0.0–100.0)

## 2022-08-03 LAB — CBC WITH DIFFERENTIAL/PLATELET
Abs Immature Granulocytes: 0.07 10*3/uL (ref 0.00–0.07)
Basophils Absolute: 0 10*3/uL (ref 0.0–0.1)
Basophils Relative: 1 %
Eosinophils Absolute: 0 10*3/uL (ref 0.0–0.5)
Eosinophils Relative: 0 %
HCT: 40.5 % (ref 39.0–52.0)
Hemoglobin: 13.3 g/dL (ref 13.0–17.0)
Immature Granulocytes: 1 %
Lymphocytes Relative: 18 %
Lymphs Abs: 1.1 10*3/uL (ref 0.7–4.0)
MCH: 29 pg (ref 26.0–34.0)
MCHC: 32.8 g/dL (ref 30.0–36.0)
MCV: 88.2 fL (ref 80.0–100.0)
Monocytes Absolute: 0.4 10*3/uL (ref 0.1–1.0)
Monocytes Relative: 7 %
Neutro Abs: 4.5 10*3/uL (ref 1.7–7.7)
Neutrophils Relative %: 73 %
Platelets: 127 10*3/uL — ABNORMAL LOW (ref 150–400)
RBC: 4.59 MIL/uL (ref 4.22–5.81)
RDW: 14.6 % (ref 11.5–15.5)
WBC: 6 10*3/uL (ref 4.0–10.5)
nRBC: 0 % (ref 0.0–0.2)

## 2022-08-03 LAB — RESP PANEL BY RT-PCR (RSV, FLU A&B, COVID)  RVPGX2
Influenza A by PCR: NEGATIVE
Influenza B by PCR: NEGATIVE
Resp Syncytial Virus by PCR: NEGATIVE
SARS Coronavirus 2 by RT PCR: NEGATIVE

## 2022-08-03 LAB — COMPREHENSIVE METABOLIC PANEL
ALT: 35 U/L (ref 0–44)
AST: 27 U/L (ref 15–41)
Albumin: 3.7 g/dL (ref 3.5–5.0)
Alkaline Phosphatase: 80 U/L (ref 38–126)
Anion gap: 12 (ref 5–15)
BUN: 21 mg/dL (ref 8–23)
CO2: 23 mmol/L (ref 22–32)
Calcium: 8.6 mg/dL — ABNORMAL LOW (ref 8.9–10.3)
Chloride: 100 mmol/L (ref 98–111)
Creatinine, Ser: 1.45 mg/dL — ABNORMAL HIGH (ref 0.61–1.24)
GFR, Estimated: 52 mL/min — ABNORMAL LOW (ref >60)
Glucose, Bld: 137 mg/dL — ABNORMAL HIGH (ref 70–99)
Potassium: 3.3 mmol/L — ABNORMAL LOW (ref 3.5–5.1)
Sodium: 135 mmol/L (ref 135–145)
Total Bilirubin: 0.8 mg/dL (ref 0.3–1.2)
Total Protein: 6.4 g/dL — ABNORMAL LOW (ref 6.5–8.1)

## 2022-08-03 LAB — TROPONIN I (HIGH SENSITIVITY)
Troponin I (High Sensitivity): 24 ng/L — ABNORMAL HIGH (ref <18)
Troponin I (High Sensitivity): 28 ng/L — ABNORMAL HIGH (ref <18)

## 2022-08-03 MED ORDER — IOHEXOL 350 MG/ML SOLN
75.0000 mL | Freq: Once | INTRAVENOUS | Status: AC | PRN
  Administered 2022-08-03: 75 mL via INTRAVENOUS

## 2022-08-03 NOTE — ED Provider Notes (Signed)
[facility] Provider Note   CSN: [id number] Arrival date & time: 08/03/22  1542     History  Chief Complaint  Patient presents with   Shortness of Breath    Randall Montoya is a 72 y.o. male.  HPI   72 year old male with past medical history of CAD status post PCI, CHF, CKD, COPD on chronic O2 who presents to the emergency department with concern for shortness of breath.  Patient states that he received the third COVID booster shot.  He had bad reactions to this vaccine in the past.  Starting yesterday he was feeling short of breath.  Endorses a nonproductive cough.  Currently he has no chest or back pain.  Denies any vomiting or diarrhea.  Was given DuoNeb and Solu-Medrol prior to arrival for reported wheezing.  Still feeling short of breath at rest on my evaluation.  Home Medications Prior to Admission medications   Medication Sig Start Date End Date Taking? Authorizing Provider  albuterol (PROVENTIL) (2.5 MG/3ML) 0.083% nebulizer solution Take 3 mLs (2.5 mg total) by nebulization every 4 (four) hours as needed for wheezing or shortness of breath. 06/20/21 06/20/22  Pokhrel, Corrie Mckusick, MD  albuterol (VENTOLIN HFA) 108 (90 Base) MCG/ACT inhaler Inhale 1 puff into the lungs every 6 (six) hours as needed for wheezing or shortness of breath. 03/22/21   [provider]  amLODipine (NORVASC) 10 MG tablet Take 10 mg by mouth daily. 03/31/21   [provider]  aspirin EC 81 MG tablet Take 81 mg by mouth daily. Swallow whole.    [provider]  atorvastatin (LIPITOR) 80 MG tablet Take 1 tablet by mouth daily. 12/07/21   [provider]  bisoprolol-hydrochlorothiazide (ZIAC) 10-6.25 MG tablet Take 1 tablet by mouth daily. 11/27/21   [provider]  bumetanide (BUMEX) 2 MG tablet Take 2 mg by mouth daily. 08/21/21   [provider]  candesartan (ATACAND) 16 MG tablet Take 16 mg by mouth daily. 02/23/22    [provider]  cholecalciferol (VITAMIN D3) 25 MCG (1000 UNIT) tablet Take 1,000 Units by mouth daily.    [provider]  clopidogrel (PLAVIX) 75 MG tablet Take 1 tablet by mouth daily. 01/01/22   [provider]  colchicine 0.6 MG tablet Take 1 tablet (0.6 mg total) by mouth daily. 06/20/21   Pokhrel, Corrie Mckusick, MD  diclofenac Sodium (VOLTAREN) 1 % GEL Apply 2 g topically 4 (four) times daily as needed (aches and pains).    [provider]  gabapentin (NEURONTIN) 100 MG capsule Take 1 capsule (100 mg total) by mouth 2 (two) times daily. Patient taking differently: Take 100 mg by mouth 3 (three) times daily. 05/06/21   Regalado, Belkys A, MD  isosorbide mononitrate (IMDUR) 30 MG 24 hr tablet Take 1 tablet (30 mg total) by mouth daily. 04/21/22 05/21/22  Antonieta Pert, MD  lisinopril (ZESTRIL) 40 MG tablet Take 40 mg by mouth daily. 06/25/21   [provider]  methocarbamol (ROBAXIN-750) 750 MG tablet Take 1 tablet (750 mg total) by mouth 4 (four) times daily. Patient taking differently: Take 750 mg by mouth every 6 (six) hours as needed for muscle spasms. 09/11/21   Jesusita Oka, MD  nitroGLYCERIN (NITROLINGUAL) 0.4 MG/SPRAY spray Place 1 spray under the tongue every 5 (five) minutes x 3 doses as needed for chest pain. 01/01/22   [provider]  oxybutynin (DITROPAN-XL) 5 MG 24 hr tablet Take 5 mg by  mouth daily. 04/30/21   [provider]  pantoprazole (PROTONIX) 40 MG tablet Take 1 tablet (40 mg total) by mouth daily. 10/17/21   Willia Craze, NP  tamsulosin (FLOMAX) 0.4 MG CAPS capsule Take 0.4 mg by mouth daily. 04/30/21   [provider]  TRELEGY ELLIPTA 100-62.5-25 MCG/ACT AEPB Inhale 1 puff into the lungs daily. 04/11/21   [provider]      Allergies    Patient has no known allergies.    Review of Systems   Review of Systems  Constitutional:  Positive for chills and fatigue. Negative for fever.   Respiratory:  Positive for cough, shortness of breath and wheezing.   Cardiovascular:  Negative for chest pain, palpitations and leg swelling.  Gastrointestinal:  Negative for abdominal pain, diarrhea and vomiting.  Musculoskeletal:  Negative for back pain.  Skin:  Negative for rash.  Neurological:  Negative for headaches.    Physical Exam Updated Vital Signs BP (!) 147/59 (BP Location: Right Arm)   Pulse (!) 107   Temp 99.4 F (37.4 C) (Oral)   Resp (!) 22   Ht 5\' 8"  (1.727 m)   Wt 111.1 kg   SpO2 96%   BMI 37.25 kg/m  Physical Exam Vitals and nursing note reviewed.  Constitutional:      General: He is not in acute distress.    Appearance: Normal appearance.  HENT:     Head: Normocephalic.     Mouth/Throat:     Mouth: Mucous membranes are moist.  Cardiovascular:     Rate and Rhythm: Normal rate.  Pulmonary:     Effort: Pulmonary effort is normal. Tachypnea present. No respiratory distress.     Breath sounds: Examination of the right-lower field reveals decreased breath sounds. Examination of the left-lower field reveals decreased breath sounds. Decreased breath sounds present. No wheezing or rales.  Abdominal:     Palpations: Abdomen is soft.     Tenderness: There is no abdominal tenderness.  Musculoskeletal:     Right lower leg: No edema.     Left lower leg: No edema.  Skin:    General: Skin is warm.  Neurological:     Mental Status: He is alert and oriented to person, place, and time. Mental status is at baseline.  Psychiatric:        Mood and Affect: Mood normal.     ED Results / Procedures / Treatments   Labs (all labs ordered are listed, but only abnormal results are displayed) Labs Reviewed  CBC WITH DIFFERENTIAL/PLATELET - Abnormal; Notable for the following components:      Result Value   Platelets 127 (*)    All other components within normal limits  COMPREHENSIVE METABOLIC PANEL - Abnormal; Notable for the following components:   Potassium 3.3  (*)    Glucose, Bld 137 (*)    Creatinine, Ser 1.45 (*)    Calcium 8.6 (*)    Total Protein 6.4 (*)    GFR, Estimated 52 (*)    All other components within normal limits  TROPONIN I (HIGH SENSITIVITY) - Abnormal; Notable for the following components:   Troponin I (High Sensitivity) 24 (*)    All other components within normal limits  RESP PANEL BY RT-PCR (RSV, FLU A&B, COVID)  [id number]  BRAIN NATRIURETIC PEPTIDE  TROPONIN I (HIGH SENSITIVITY)    EKG None  Radiology DG Chest Port 1 View  Result Date: 08/03/2022 CLINICAL DATA:  Shortness of breath.  Fever. EXAM: PORTABLE CHEST  1 VIEW COMPARISON:  Chest radiographs 04/18/2022 and 06/16/2021; CT chest 12/11/2021 FINDINGS: There is again mild-to-moderate elevation of the left hemidiaphragm, unchanged from prior CT and radiographs. Cardiac silhouette and mediastinal contours are within normal limits. Chronic left pleural thickening is unchanged and appears represent scarring rather than a pleural effusion. Bibasilar moderate interstitial thickening is also unchanged. A metallic BB projects over the superomedial right hemithorax, seen to be within the posterosuperior upper back soft tissues close to the posterior skin surface on prior CT. Unchanged likely partial resection of the posterior left fourth rib. IMPRESSION: 1. Unchanged chronic elevation of the left hemidiaphragm and chronic left pleural thickening. 2. Unchanged bibasilar interstitial thickening. 3. No acute lung process. Electronically Signed   By: Yvonne Kendall M.D.   On: 08/03/2022 17:03    Procedures Procedures    Medications Ordered in ED Medications - No data to display  ED Course/ Medical Decision Making/ A&P                             Medical Decision Making Amount and/or Complexity of Data Reviewed Labs: ordered. Radiology: ordered.  Risk Prescription drug management.   72 year old male presents emergency department with concern for shortness of breath and a  reaction to his third COVID booster.  Patient states that this has happened before.  He presents with shortness of breath, tachypnea, increased requirement on his oxygen.  Patient received breathing treatments, steroids and magnesium prior to arrival.  On my evaluation patient has no significant wheezing.  He appears short of breath but comfortable.  Blood work shows baseline kidney dysfunction, CBC is baseline.  BNP is slightly elevated as well as the initial troponin.  No active chest pain.  Patient continues to be tachycardic, unclear if this was a side effect of the breathing treatment. EKG is sinus tachycardia. Repeat troponin is elevated, but flat.  However given his increase oxygen requirement a CT PE study was done.  This showed no pulmonary embolism or acute finding.  On reevaluation patient had been titrated down to his baseline 3 L.  He appears comfortable.  Patient was ambulated in the department with no tachycardia or hypoxia.  He states that he feels back to baseline.  In regards to the slightly elevated BNP and troponins which are flat I reached out to the cardiology fellow, Dr. Patsey Berthold.  Patient was just admitted about 3 months ago for anginal symptoms.  He had a thorough cardiac workup at that time and was referred outpatient.  We do not feel that these troponins indicate acute ACS, EKG is unchanged for the patient.  Do not feel patient needs emergent admission for further cardiac workup that was just recently reassuring.  Patient at this time appears safe and stable for discharge and close outpatient follow up. Discharge plan and strict return to ED precautions discussed, patient verbalizes understanding and agreement.        Final Clinical Impression(s) / ED Diagnoses Final diagnoses:  None    Rx / DC Orders ED Discharge Orders     None         Lorelle Gibbs, DO 08/03/22 2328

## 2022-08-03 NOTE — Discharge Instructions (Addendum)
You have been seen and discharged from the emergency department.  You were treated with medicine, and breathing treatments and improved. Your Covid/Flu/RSV swab was negative. Follow-up with your primary provider for further evaluation and further care. Take home medications as prescribed. If you have any worsening symptoms or further concerns for your health please return to an emergency department for further evaluation.

## 2022-08-03 NOTE — ED Triage Notes (Signed)
Pt BIB [facility] from home due to [facility] that started after receiving third COVID booster shot.  Pt reports he has had "bad reactions" after each injection.  First injection hospitalized 21 days and second injection 5 days.  Pt has 3L of oxygen at baseline.  VS BP 148/76, HR 124.  [facility] gave 2 duoneb, 125 solumedrol and 2 grams of magnesium en route.

## 2023-08-02 ENCOUNTER — Emergency Department (HOSPITAL_COMMUNITY)
Admission: EM | Admit: 2023-08-02 | Discharge: 2023-08-02 | Disposition: A | Discharge status: Home / Self Care | Attending: Emergency Medicine | Admitting: Emergency Medicine

## 2023-08-02 ENCOUNTER — Other Ambulatory Visit: Payer: Self-pay

## 2023-08-02 ENCOUNTER — Encounter (HOSPITAL_COMMUNITY): Payer: Self-pay

## 2023-08-02 DIAGNOSIS — I509 Heart failure, unspecified: Secondary | ICD-10-CM | POA: Diagnosis not present

## 2023-08-02 DIAGNOSIS — M25531 Pain in right wrist: Secondary | ICD-10-CM | POA: Diagnosis present

## 2023-08-02 DIAGNOSIS — Z79899 Other long term (current) drug therapy: Secondary | ICD-10-CM | POA: Diagnosis not present

## 2023-08-02 DIAGNOSIS — M79641 Pain in right hand: Secondary | ICD-10-CM | POA: Diagnosis not present

## 2023-08-02 DIAGNOSIS — I110 Hypertensive heart disease with heart failure: Secondary | ICD-10-CM | POA: Diagnosis not present

## 2023-08-02 DIAGNOSIS — Z7901 Long term (current) use of anticoagulants: Secondary | ICD-10-CM | POA: Insufficient documentation

## 2023-08-02 DIAGNOSIS — Z7982 Long term (current) use of aspirin: Secondary | ICD-10-CM | POA: Insufficient documentation

## 2023-08-02 MED ORDER — HYDROCODONE-ACETAMINOPHEN 5-325 MG PO TABS
1.0000 | ORAL_TABLET | Freq: Once | ORAL | Status: AC
  Administered 2023-08-02: 1 via ORAL
  Filled 2023-08-02: qty 1

## 2023-08-02 MED ORDER — PREDNISONE 20 MG PO TABS: 40.0000 mg | ORAL_TABLET | Freq: Every day | ORAL | 0 refills | Status: AC

## 2023-08-02 NOTE — Discharge Instructions (Addendum)
 Follow-up with primary care.  Wear wrist brace.  You can use ice over your wrist and gentle stretching.  Take Tylenol 1000 mg every 6 hours.  You can use topical over-the-counter like icy hot. I have sent steroids to your pharmacy please take as prescribed.  Return to ER with new or worsening symptoms.

## 2023-08-02 NOTE — ED Triage Notes (Signed)
 PT BIB EMS for pain over the last 10 days, from his left hand across the shoulders to the right hand. States more of a muscular pain.

## 2023-08-02 NOTE — Progress Notes (Signed)
 Orthopedic Tech Progress Note Patient Details:  Randall Montoya 1950-08-28 [id number]  Ortho Devices Type of Ortho Device: Velcro wrist splint Ortho Device/Splint Location: RUE Ortho Device/Splint Interventions: Ordered, Application, Adjustment   Post Interventions Patient Tolerated: Well Instructions Provided: Adjustment of device, Care of device  Tonye Pearson 08/02/2023, 12:41 PM

## 2023-08-02 NOTE — ED Provider Notes (Signed)
 [place] EMERGENCY DEPARTMENT AT [facility] Provider Note   CSN: [id number] Arrival date & time: 08/02/23  1047     History  No chief complaint on file.   Randall Montoya is a 73 y.o. male with medical history of congestive heart failure -- last echo 2023 55-60%, COPD on 2L [place] at baseline, hypertension, hyperlipidemia, GERD presenting to emergency room with complaint of right wrist and hand pain. Patient reports this started over his left arm, last week and now he is having right arm pain. He has tried ibuprofen, but it is not really helping.  Denies chest pain, shortness of breath, URI like symptoms, abdominal pain, NVD, fever, chills.   HPI     Home Medications Prior to Admission medications   Medication Sig Start Date End Date Taking? Authorizing Provider  albuterol (PROVENTIL) (2.5 MG/3ML) 0.083% nebulizer solution Take 3 mLs (2.5 mg total) by nebulization every 4 (four) hours as needed for wheezing or shortness of breath. 06/20/21 06/20/22  Pokhrel, Rebekah Chesterfield, MD  albuterol (VENTOLIN HFA) 108 (90 Base) MCG/ACT inhaler Inhale 1 puff into the lungs every 6 (six) hours as needed for wheezing or shortness of breath. 03/22/21   [provider]  amLODipine (NORVASC) 10 MG tablet Take 10 mg by mouth daily. 03/31/21   [provider]  aspirin EC 81 MG tablet Take 81 mg by mouth daily. Swallow whole.    [provider]  atorvastatin (LIPITOR) 80 MG tablet Take 1 tablet by mouth daily. 12/07/21   [provider]  bisoprolol-hydrochlorothiazide (ZIAC) 10-6.25 MG tablet Take 1 tablet by mouth daily. 11/27/21   [provider]  bumetanide (BUMEX) 2 MG tablet Take 2 mg by mouth daily. 08/21/21   [provider]  candesartan (ATACAND) 16 MG tablet Take 16 mg by mouth daily. 02/23/22   [provider]  cholecalciferol (VITAMIN D3) 25 MCG (1000 UNIT) tablet Take 1,000 Units by mouth daily.    [provider]  clopidogrel  (PLAVIX) 75 MG tablet Take 1 tablet by mouth daily. 01/01/22   [provider]  colchicine 0.6 MG tablet Take 1 tablet (0.6 mg total) by mouth daily. 06/20/21   Pokhrel, Rebekah Chesterfield, MD  diclofenac Sodium (VOLTAREN) 1 % GEL Apply 2 g topically 4 (four) times daily as needed (aches and pains).    [provider]  gabapentin (NEURONTIN) 100 MG capsule Take 1 capsule (100 mg total) by mouth 2 (two) times daily. Patient taking differently: Take 100 mg by mouth 3 (three) times daily. 05/06/21   Regalado, Belkys A, MD  isosorbide mononitrate (IMDUR) 30 MG 24 hr tablet Take 1 tablet (30 mg total) by mouth daily. 04/21/22 05/21/22  Lanae Boast, MD  lisinopril (ZESTRIL) 40 MG tablet Take 40 mg by mouth daily. 06/25/21   [provider]  methocarbamol (ROBAXIN-750) 750 MG tablet Take 1 tablet (750 mg total) by mouth 4 (four) times daily. Patient taking differently: Take 750 mg by mouth every 6 (six) hours as needed for muscle spasms. 09/11/21   Diamantina Monks, MD  nitroGLYCERIN (NITROLINGUAL) 0.4 MG/SPRAY spray Place 1 spray under the tongue every 5 (five) minutes x 3 doses as needed for chest pain. 01/01/22   [provider]  oxybutynin (DITROPAN-XL) 5 MG 24 hr tablet Take 5 mg by mouth daily. 04/30/21   [provider]  pantoprazole (PROTONIX) 40 MG tablet Take 1 tablet (40 mg total) by mouth daily. 10/17/21   Meredith Pel, NP  tamsulosin (  FLOMAX) 0.4 MG CAPS capsule Take 0.4 mg by mouth daily. 04/30/21   [provider]  TRELEGY ELLIPTA 100-62.5-25 MCG/ACT AEPB Inhale 1 puff into the lungs daily. 04/11/21   [provider]      Allergies    Patient has no known allergies.    Review of Systems   Review of Systems  Musculoskeletal:  Positive for arthralgias.    Physical Exam Updated Vital Signs BP (!) 140/58 (BP Location: Left Arm)   Pulse 70   Temp 98.3 F (36.8 C) (Oral)   Resp (!) 22   Ht 5\' 8"  (1.727 m)   Wt 110.7 kg   SpO2 100%   BMI  37.10 kg/m  Physical Exam Vitals and nursing note reviewed.  Constitutional:      General: He is not in acute distress.    Appearance: He is not toxic-appearing.  HENT:     Head: Normocephalic and atraumatic.  Eyes:     General: No scleral icterus.    Conjunctiva/sclera: Conjunctivae normal.  Cardiovascular:     Rate and Rhythm: Normal rate and regular rhythm.     Pulses: Normal pulses.     Heart sounds: Normal heart sounds.  Pulmonary:     Effort: Pulmonary effort is normal. No respiratory distress.     Breath sounds: Normal breath sounds.  Abdominal:     General: Abdomen is flat. Bowel sounds are normal.     Palpations: Abdomen is soft.     Tenderness: There is no abdominal tenderness.  Musculoskeletal:     Right lower leg: No edema.     Left lower leg: No edema.     Comments: Strong radial pulse, equal   Skin:    General: Skin is warm and dry.     Findings: No lesion.  Neurological:     General: No focal deficit present.     Mental Status: He is alert and oriented to person, place, and time. Mental status is at baseline.     ED Results / Procedures / Treatments   Labs (all labs ordered are listed, but only abnormal results are displayed) Labs Reviewed - No data to display  EKG None  Radiology No results found.  Procedures Procedures    Medications Ordered in ED Medications - No data to display  ED Course/ Medical Decision Making/ A&P                                 Medical Decision Making Risk Prescription drug management.   This patient presents to the ED for concern of right arm pain, this involves an extensive number of treatment options, and is a complaint that carries with it a high risk of complications and morbidity.  The differential diagnosis includes, pseudogout, arthritis, tendinitis, carpal tunnel, septic arthritis, cellulitis    Problem List / ED Course / Critical interventions / Medication management  Patient presented to emergency  room with complaint of right arm pain.  Patient is neurovascularly intact.  Sensation of upper extremity is equal on both sides.  Strong radial pulse.  No obvious swelling.  No sign of infection.  Good cap refill.  He does have pain in his thumb wrist and forearm.  Patient reports he has felt like this in the past.  Pain is reproducible on exam and worse with range of motion.  No injury trauma or fall thus I do not feel imaging is needed.  Feel this is likely muscular.  He is hemodynamically stable and well-appearing.  Do not feel that further workup is necessary at this time. Feels better after brace and medicine. Will have him wear wrist brace and follow up with PCP. Discussed pain management options. Patient agrees and understands plan, stable for discharge.  I ordered medication including Norco, wrist brace Reevaluation of the patient after these medicines showed that the patient improved I have reviewed the patients home medicines and have made adjustments as needed   Plan  F/u w/ PCP in 2-3d to ensure resolution of sx.  Patient was given return precautions. Patient stable for discharge at this time.  Patient educated on sx/dx and verbalized understanding of plan. Return to ER w/ new or worsening sx.          Final Clinical Impression(s) / ED Diagnoses Final diagnoses:  Arthralgia of right wrist    Rx / DC Orders ED Discharge Orders          Ordered    predniSONE (DELTASONE) 20 MG tablet  Daily        08/02/23 1156              Eliese Kerwood, Horald Chestnut, PA-C 08/02/23 1409    Melene Plan, DO 08/02/23 1514

## 2023-09-12 ENCOUNTER — Encounter: Payer: Self-pay | Admitting: Orthopaedic Surgery

## 2023-09-12 ENCOUNTER — Other Ambulatory Visit (INDEPENDENT_AMBULATORY_CARE_PROVIDER_SITE_OTHER): Payer: Self-pay

## 2023-09-12 ENCOUNTER — Ambulatory Visit: Admitting: Orthopaedic Surgery

## 2023-09-12 DIAGNOSIS — M79641 Pain in right hand: Secondary | ICD-10-CM

## 2023-09-12 DIAGNOSIS — M79642 Pain in left hand: Secondary | ICD-10-CM

## 2023-09-12 DIAGNOSIS — M542 Cervicalgia: Secondary | ICD-10-CM | POA: Diagnosis not present

## 2023-09-12 MED ORDER — TRAMADOL HCL 50 MG PO TABS: 50.0000 mg | ORAL_TABLET | Freq: Every day | ORAL | 0 refills | Status: AC | PRN

## 2023-09-12 NOTE — Progress Notes (Signed)
 Office Visit Note   Patient: Randall Montoya           Date of Birth: 10-16-1950           MRN: [id number] Visit Date: 09/12/2023              Requested by: Jonell Neptune, FNP [id number] [place],  [place] [address] PCP: Jonell Neptune, FNP   Assessment & Plan: Visit Diagnoses:  1. Neck pain   2. Bilateral hand pain     Plan: Difficult to say exactly what what is going on based on his symptoms and history.  Poor historian in general.  Based on what I can gather I feel that this is manifestation of cervical radiculopathy.  I would like to send him to Dr. Daisey Dryer for ESI's.  Prescription for tramadol  for breakthrough pain.  Follow-up as needed.  Follow-Up Instructions: No follow-ups on file.   Orders:  Orders Placed This Encounter  Procedures   XR Cervical Spine 2 or 3 views   XR Hand Complete Left   XR Hand Complete Right   Ambulatory referral to Physical Medicine Rehab   Meds ordered this encounter  Medications   traMADol  (ULTRAM ) 50 MG tablet    Sig: Take 1 tablet (50 mg total) by mouth daily as needed.    Dispense:  7 tablet    Refill:  0      Procedures: No procedures performed   Clinical Data: No additional findings.   Subjective: Chief Complaint  Patient presents with   Right Hand - Pain   Left Hand - Pain    HPI Vinayak is a 73 year old gentleman here for evaluation of bilateral hand pain that radiates into the arms and neck.  Went to the ED for this and was given prednisone  which helped temporarily.  He is a very poor historian.  He has had a prior C-spine MRI which showed diffuse degenerative changes.  Denies any recent trauma.  Symptoms better with rest and worse with activity. Review of Systems  Constitutional: Negative.   HENT: Negative.    Eyes: Negative.   Respiratory: Negative.    Cardiovascular: Negative.   Gastrointestinal: Negative.   Endocrine: Negative.   Genitourinary: Negative.   Skin: Negative.    Allergic/Immunologic: Negative.   Neurological: Negative.   Hematological: Negative.   Psychiatric/Behavioral: Negative.    All other systems reviewed and are negative.    Objective: Vital Signs: There were no vitals taken for this visit.  Physical Exam Vitals and nursing note reviewed.  Constitutional:      Appearance: He is well-developed.  Pulmonary:     Effort: Pulmonary effort is normal.  Abdominal:     Palpations: Abdomen is soft.  Skin:    General: Skin is warm.  Neurological:     Mental Status: He is alert and oriented to person, place, and time.  Psychiatric:        Behavior: Behavior normal.        Thought Content: Thought content normal.        Judgment: Judgment normal.     Ortho Exam Exam of bilateral hands and extremities and neck show no focal motor or sensory deficits. Specialty Comments:  Narrative & Impression CLINICAL DATA:  Spondylosis, C6-7.  Neck pain.   EXAM: MRI CERVICAL SPINE WITHOUT CONTRAST   TECHNIQUE: Multiplanar, multisequence MR imaging of the cervical spine was performed. No intravenous contrast was administered.   COMPARISON:  Radiographs April 19, 2021.  FINDINGS: The study is partially degraded by motion.   Alignment: Straightening of the cervical curvature.   Vertebrae: No fracture, evidence of discitis, or bone lesion.   Cord: Mass effect on the cord at C6-7 without definitive cord signal abnormality.   Posterior Fossa, vertebral arteries, paraspinal tissues: Negative.   Disc levels:   C1-2: Right-sided atlantoaxial joint effusion.   C2-3: Facet degenerative changes resulting in mild left neural foraminal narrowing. No spinal canal stenosis.   C3-4: Posterior disc osteophyte complex causing indentation of the thecal sac and contacting the anterior cord surface without significant spinal canal stenosis. Facet degenerative changes without significant neural foraminal narrowing.   C4-5: Mild facet  degenerative changes. No significant spinal canal or neural foraminal stenosis.   C5-6: Posterior disc osteophyte complex with ossification of the posterior longitudinal ligament abutting the anterior cord surface resulting in mild spinal canal stenosis. Uncovertebral and facet degenerative changes resulting in mild bilateral neural foraminal narrowing.   C6-7: Posterior disc osteophyte complex with ossification of the posterior longitudinal ligament resulting in mild mass effect on the cord and mild to moderate spinal canal stenosis. Uncovertebral and facet degenerative changes resulting in severe right and moderate left neural foraminal narrowing.   C7-T1: Small disc bulge and mild facet degenerative changes resulting in mild bilateral neural foraminal narrowing. No significant spinal canal stenosis.   IMPRESSION: 1. 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 2. 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 3. Severe right and moderate left neural foraminal narrowing at C6-7.     Electronically Signed   By: Katyucia  de Macedo Rodrigues M.D.   On: 05/23/2021 11:00  Imaging: XR Cervical Spine 2 or 3 views Result Date: 09/12/2023 X-rays of the cervical spine show no acute abnormalities.  Degenerative disc disease at C6-7.  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  Otherwise there is diffuse degenerative changes appropriate for age.  XR Hand Complete Right Result Date: 09/12/2023 X-rays of the right hand show age-appropriate diffuse degenerative changes.  No acute abnormalities.    PMFS History: Patient Active Problem List   Diagnosis Date Noted   Chest pain 04/18/2022   Colon cancer screening    Benign neoplasm of cecum    Benign neoplasm of  ascending colon    Gastric nodule    Dysphagia    Odynophagia    Gastroesophageal reflux disease with esophagitis without hemorrhage    S/P herniorrhaphy 09/05/2021   Dyspnea 06/17/2021   OSA on CPAP 06/17/2021   CAD (coronary artery disease) 06/17/2021   Acute exacerbation of chronic obstructive pulmonary disease (COPD) (HCC) 06/17/2021   Heart failure (HCC) 05/05/2021   Respiratory distress 05/04/2021   Chronic diastolic CHF (congestive heart failure) (HCC)    COPD with acute exacerbation (HCC)    Hypertension    Hyperlipidemia    Lumbar foraminal stenosis 04/20/2021   Spondylosis without myelopathy or radiculopathy, cervical region 04/20/2021   Past Medical History:  Diagnosis Date   Arthritis    Blood transfusion without reported diagnosis    trauma 1975   Brain syndrome    Cataract    Congestive heart failure (CHF) (HCC)    COPD (chronic obstructive pulmonary disease) (HCC)    Dyspnea    GERD (gastroesophageal reflux disease)    Hyperlipidemia  Hypertension    Kidney failure    Myocardial infarction (HCC)    Oxygen deficiency    Prediabetes    Sleep apnea     Family History  Problem Relation Age of Onset   Heart disease Mother    Lung cancer Father    Lung cancer Brother    Colon cancer Neg Hx    Colon polyps Neg Hx    Esophageal cancer Neg Hx    Stomach cancer Neg Hx    Rectal cancer Neg Hx     Past Surgical History:  Procedure Laterality Date   BIOPSY  09/12/2021   Procedure: BIOPSY;  Surgeon: Asencion Blacksmith, MD;  Location: [facility] ENDOSCOPY;  Service: Gastroenterology;;   BIOPSY  01/25/2022   Procedure: BIOPSY;  Surgeon: Asencion Blacksmith, MD;  Location: [facility] ENDOSCOPY;  Service: Gastroenterology;;   BIOPSY  03/15/2022   Procedure: BIOPSY;  Surgeon: Normie Becton., MD;  Location: [facility] ENDOSCOPY;  Service: Gastroenterology;;   COLONOSCOPY WITH PROPOFOL  N/A 01/25/2022   Procedure: COLONOSCOPY WITH PROPOFOL ;  Surgeon: Asencion Blacksmith, MD;  Location: [facility]  ENDOSCOPY;  Service: Gastroenterology;  Laterality: N/A;   ESOPHAGOGASTRODUODENOSCOPY N/A 03/15/2022   Procedure: ESOPHAGOGASTRODUODENOSCOPY (EGD);  Surgeon: Normie Becton., MD;  Location: [facility] ENDOSCOPY;  Service: Gastroenterology;  Laterality: N/A;   ESOPHAGOGASTRODUODENOSCOPY (EGD) WITH PROPOFOL  N/A 09/12/2021   Procedure: ESOPHAGOGASTRODUODENOSCOPY (EGD) WITH PROPOFOL ;  Surgeon: Asencion Blacksmith, MD;  Location[facility] ENDOSCOPY;  Service: Gastroenterology;  Laterality: N/A;   ESOPHAGOGASTRODUODENOSCOPY (EGD) WITH PROPOFOL  N/A 01/25/2022   Procedure: ESOPHAGOGASTRODUODENOSCOPY (EGD) WITH PROPOFOL ;  Surgeon: Asencion Blacksmith, MD;  Location: [facility] ENDOSCOPY;  Service: Gastroenterology;  Laterality: N/A;   EUS N/A 03/15/2022   Procedure: UPPER ENDOSCOPIC ULTRASOUND (EUS) RADIAL;  Surgeon: Normie Becton., MD;  Location: [facility] ENDOSCOPY;  Service: Gastroenterology;  Laterality: N/A;   heart stent     INSERTION OF MESH N/A 09/05/2021   Procedure: INSERTION OF MESH;  Surgeon: Anda Bamberg, MD;  Location: [facility] OR;  Service: General;  Laterality: N/A;   LEFT HEART CATH AND CORONARY ANGIOGRAPHY N/A 04/20/2022   Procedure: LEFT HEART CATH AND CORONARY ANGIOGRAPHY;  Surgeon: Odie Benne, MD;  Location: [facility] INVASIVE CV LAB;  Service: Cardiovascular;  Laterality: N/A;   myocardial infarction     POLYPECTOMY  01/25/2022   Procedure: POLYPECTOMY;  Surgeon: Asencion Blacksmith, MD;  Location: [facility] ENDOSCOPY;  Service: Gastroenterology;;   VENTRAL HERNIA REPAIR N/A 09/05/2021   Procedure: LAPAROSCOPIC VENTRAL HERNIA REPAIR;  Surgeon: Anda Bamberg, MD;  Location: [facility] OR;  Service: General;  Laterality: N/A;   Social History   Occupational History   Not on file  Tobacco Use   Smoking status: Former    Types: Cigarettes   Smokeless tobacco: Never  Vaping Use   Vaping status: Never Used  Substance and Sexual Activity   Alcohol use: Not Currently   Drug use: Never   Sexual activity: Not on file

## 2023-09-30 ENCOUNTER — Other Ambulatory Visit: Payer: Self-pay

## 2023-09-30 ENCOUNTER — Ambulatory Visit: Admitting: Physical Medicine and Rehabilitation

## 2023-09-30 VITALS — BP 144/70 | HR 66

## 2023-09-30 DIAGNOSIS — M5412 Radiculopathy, cervical region: Secondary | ICD-10-CM | POA: Diagnosis not present

## 2023-09-30 MED ORDER — METHYLPREDNISOLONE ACETATE 40 MG/ML IJ SUSP
40.0000 mg | Freq: Once | INTRAMUSCULAR | Status: AC
  Administered 2023-09-30: 40 mg

## 2023-09-30 NOTE — Progress Notes (Signed)
 Pain Scale   Average Pain 3 Patient advising he off his Plavix  for injection(09/22/23 per patient) Patient advising his neck pain is constant with radiating bilaterally to both shoulders, patient advising he has little to no relief.        +Driver, -BT, -Dye Allergies.

## 2023-09-30 NOTE — Patient Instructions (Signed)

## 2023-10-07 NOTE — Procedures (Signed)
 Cervical Epidural Steroid Injection - Interlaminar Approach with Fluoroscopic Guidance  Patient: Randall Montoya      Date of Birth: 1950-10-24 MRN: [id number] PCP: Jonell Neptune, FNP      Visit Date: 09/30/2023   Universal Protocol:    Date/Time: 05/19/255:24 PM  Consent Given By: the patient  Position: PRONE  Additional Comments: Vital signs were monitored before and after the procedure. Patient was prepped and draped in the usual sterile fashion. The correct patient, procedure, and site was verified.   Injection Procedure Details:   Procedure diagnoses: Cervical radiculopathy [M54.12]    Meds Administered:  Meds ordered this encounter  Medications   methylPREDNISolone  acetate (DEPO-MEDROL ) injection 40 mg     Laterality: Right  Location/Site: C7-T1  Needle: 3.5 in., 20 ga. Tuohy  Needle Placement: Paramedian epidural space  Findings:  -Comments: Excellent flow of contrast into the epidural space.  Procedure Details: Using a paramedian approach from the side mentioned above, the region overlying the inferior lamina was localized under fluoroscopic visualization and the soft tissues overlying this structure were infiltrated with 4 ml. of 1% Lidocaine  without Epinephrine . A # 20 gauge, Tuohy needle was inserted into the epidural space using a paramedian approach.  The epidural space was localized using loss of resistance along with contralateral oblique bi-planar fluoroscopic views.  After negative aspirate for air, blood, and CSF, a 2 ml. volume of Isovue-250 was injected into the epidural space and the flow of contrast was observed. Radiographs were obtained for documentation purposes.   The injectate was administered into the level noted above.  Additional Comments:  The patient tolerated the procedure well Dressing: 2 x 2 sterile gauze and Band-Aid    Post-procedure details: Patient was observed during the procedure. Post-procedure instructions were  reviewed.  Patient left the clinic in stable condition.

## 2023-10-07 NOTE — Progress Notes (Signed)
 DEKE TILGHMAN - 73 y.o. male MRN [id number]  Date of birth: 26-Mar-1951  Office Visit Note: Visit Date: 09/30/2023 PCP: Jonell Neptune, FNP Referred by: Jonell Neptune,*  Subjective: Chief Complaint  Patient presents with   Neck - Pain   HPI:  MONTELL LEOPARD is a 73 y.o. male who comes in today at the request of Dr. Claria Crofts for planned Right C7-T1 Cervical Interlaminar epidural steroid injection with fluoroscopic guidance.  The patient has failed conservative care including home exercise, medications, time and activity modification.  This injection will be diagnostic and hopefully therapeutic.  Please see requesting physician notes for further details and justification.   ROS Otherwise per HPI.  Assessment & Plan: Visit Diagnoses:    ICD-10-CM   1. Cervical radiculopathy  M54.12 XR C-ARM NO REPORT    Epidural Steroid injection    methylPREDNISolone  acetate (DEPO-MEDROL ) injection 40 mg      Plan: No additional findings.   Meds & Orders:  Meds ordered this encounter  Medications   methylPREDNISolone  acetate (DEPO-MEDROL ) injection 40 mg    Orders Placed This Encounter  Procedures   XR C-ARM NO REPORT   Epidural Steroid injection    Follow-up: Return for visit to requesting provider as needed.   Procedures: No procedures performed  Cervical Epidural Steroid Injection - Interlaminar Approach with Fluoroscopic Guidance  Patient: EVIN LOISEAU      Date of Birth: [birth year] MRN: [id number] PCP: Jonell Neptune, FNP      Visit Date: 09/30/2023   Universal Protocol:    Date/Time: 05/19/255:24 PM  Consent Given By: the patient  Position: PRONE  Additional Comments: Vital signs were monitored before and after the procedure. Patient was prepped and draped in the usual sterile fashion. The correct patient, procedure, and site was verified.   Injection Procedure Details:   Procedure diagnoses: Cervical radiculopathy [M54.12]    Meds  Administered:  Meds ordered this encounter  Medications   methylPREDNISolone  acetate (DEPO-MEDROL ) injection 40 mg     Laterality: Right  Location/Site: C7-T1  Needle: 3.5 in., 20 ga. Tuohy  Needle Placement: Paramedian epidural space  Findings:  -Comments: Excellent flow of contrast into the epidural space.  Procedure Details: Using a paramedian approach from the side mentioned above, the region overlying the inferior lamina was localized under fluoroscopic visualization and the soft tissues overlying this structure were infiltrated with 4 ml. of 1% Lidocaine  without Epinephrine . A # 20 gauge, Tuohy needle was inserted into the epidural space using a paramedian approach.  The epidural space was localized using loss of resistance along with contralateral oblique bi-planar fluoroscopic views.  After negative aspirate for air, blood, and CSF, a 2 ml. volume of Isovue-250 was injected into the epidural space and the flow of contrast was observed. Radiographs were obtained for documentation purposes.   The injectate was administered into the level noted above.  Additional Comments:  The patient tolerated the procedure well Dressing: 2 x 2 sterile gauze and Band-Aid    Post-procedure details: Patient was observed during the procedure. Post-procedure instructions were reviewed.  Patient left the clinic in stable condition.   Clinical History: Narrative & Impression CLINICAL DATA:  Spondylosis, C6-7.  Neck pain.   EXAM: MRI CERVICAL SPINE WITHOUT CONTRAST   TECHNIQUE: Multiplanar, multisequence MR imaging of the cervical spine was performed. No intravenous contrast was administered.   COMPARISON:  Radiographs April 19, 2021.   FINDINGS: The study is partially degraded by motion.  Alignment: Straightening of the cervical curvature.   Vertebrae: No fracture, evidence of discitis, or bone lesion.   Cord: Mass effect on the cord at C6-7 without definitive cord  signal abnormality.   Posterior Fossa, vertebral arteries, paraspinal tissues: Negative.   Disc levels:   C1-2: Right-sided atlantoaxial joint effusion.   C2-3: Facet degenerative changes resulting in mild left neural foraminal narrowing. No spinal canal stenosis.   C3-4: Posterior disc osteophyte complex causing indentation of the thecal sac and contacting the anterior cord surface without significant spinal canal stenosis. Facet degenerative changes without significant neural foraminal narrowing.   C4-5: Mild facet degenerative changes. No significant spinal canal or neural foraminal stenosis.   C5-6: Posterior disc osteophyte complex with ossification of the posterior longitudinal ligament abutting the anterior cord surface resulting in mild spinal canal stenosis. Uncovertebral and facet degenerative changes resulting in mild bilateral neural foraminal narrowing.   C6-7: Posterior disc osteophyte complex with ossification of the posterior longitudinal ligament resulting in mild mass effect on the cord and mild to moderate spinal canal stenosis. Uncovertebral and facet degenerative changes resulting in severe right and moderate left neural foraminal narrowing.   C7-T1: Small disc bulge and mild facet degenerative changes resulting in mild bilateral neural foraminal narrowing. No significant spinal canal stenosis.   IMPRESSION: 1. 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 2. 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 3. Severe right and moderate left neural foraminal narrowing at C6-7.     Electronically Signed   By: Katyucia  de Macedo Rodrigues M.D.   On: 05/23/2021 11:00     Objective:  VS:  HT:    WT:   BMI:     BP:(!) 144/70  HR:66bpm  TEMP: ( )  RESP:  Physical Exam Vitals and nursing note reviewed.  Constitutional:       General: He is not in acute distress.    Appearance: Normal appearance. He is not ill-appearing.  HENT:     Head: Normocephalic and atraumatic.     Right Ear: External ear normal.     Left Ear: External ear normal.  Eyes:     Extraocular Movements: Extraocular movements intact.  Cardiovascular:     Rate and Rhythm: Normal rate.     Pulses: Normal pulses.  Abdominal:     General: There is no distension.     Palpations: Abdomen is soft.  Musculoskeletal:        General: No signs of injury.     Cervical back: Neck supple. Tenderness present. No rigidity.     Right lower leg: No edema.     Left lower leg: No edema.     Comments: Patient has good strength in the upper extremities with 5 out of 5 strength in wrist extension long finger flexion APB.  No intrinsic hand muscle atrophy.  Negative Hoffmann's test.  Lymphadenopathy:     Cervical: No cervical adenopathy.  Skin:    Findings: No erythema or rash.  Neurological:     General: No focal deficit present.     Mental Status: He is alert and oriented to person, place, and time.     Sensory: No sensory deficit.     Motor: No weakness or abnormal muscle tone.     Coordination: Coordination normal.  Psychiatric:        Mood and Affect: Mood normal.  Behavior: Behavior normal.      Imaging: No results found.

## 2024-05-28 ENCOUNTER — Other Ambulatory Visit: Payer: Self-pay | Admitting: Nephrology

## 2024-05-28 DIAGNOSIS — N1832 Chronic kidney disease, stage 3b: Secondary | ICD-10-CM

## 2024-06-11 ENCOUNTER — Ambulatory Visit
Admission: RE | Admit: 2024-06-11 | Discharge: 2024-06-11 | Disposition: A | Discharge status: Home / Self Care | Source: Ambulatory Visit | Attending: Nephrology | Admitting: Nephrology

## 2024-06-11 DIAGNOSIS — N1832 Chronic kidney disease, stage 3b: Secondary | ICD-10-CM
